# Patient Record
Sex: Female | Born: 1940
Health system: Southern US, Community
[De-identification: ages and names within clinical notes are randomized; demographics above are authoritative.]

## PROBLEM LIST (undated history)

## (undated) DIAGNOSIS — Z9181 History of falling: Secondary | ICD-10-CM

## (undated) DIAGNOSIS — K219 Gastro-esophageal reflux disease without esophagitis: Secondary | ICD-10-CM

## (undated) DIAGNOSIS — Z8711 Personal history of peptic ulcer disease: Secondary | ICD-10-CM

## (undated) DIAGNOSIS — H409 Unspecified glaucoma: Secondary | ICD-10-CM

## (undated) DIAGNOSIS — I639 Cerebral infarction, unspecified: Secondary | ICD-10-CM

## (undated) DIAGNOSIS — N201 Calculus of ureter: Secondary | ICD-10-CM

## (undated) DIAGNOSIS — Z8719 Personal history of other diseases of the digestive system: Secondary | ICD-10-CM

## (undated) DIAGNOSIS — R011 Cardiac murmur, unspecified: Secondary | ICD-10-CM

## (undated) DIAGNOSIS — R3915 Urgency of urination: Secondary | ICD-10-CM

## (undated) DIAGNOSIS — M1990 Unspecified osteoarthritis, unspecified site: Secondary | ICD-10-CM

## (undated) DIAGNOSIS — Z87442 Personal history of urinary calculi: Secondary | ICD-10-CM

## (undated) DIAGNOSIS — E785 Hyperlipidemia, unspecified: Secondary | ICD-10-CM

## (undated) HISTORY — PX: COLONOSCOPY: SHX174

## (undated) HISTORY — PX: BACK SURGERY: SHX140

## (undated) HISTORY — PX: ENDOMETRIAL ABLATION: SHX621

## (undated) HISTORY — PX: UPPER GI ENDOSCOPY: SHX6162

## (undated) HISTORY — PX: EYE SURGERY: SHX253

## (undated) HISTORY — PX: TUBAL LIGATION: SHX77

---

## 1990-08-03 HISTORY — PX: CERVICAL SPINE SURGERY: SHX589

## 1993-08-03 HISTORY — PX: PUBOVAGINAL SLING: SHX1035

## 1996-08-03 HISTORY — PX: LAPAROSCOPIC CHOLECYSTECTOMY: SUR755

## 1999-09-25 ENCOUNTER — Encounter (INDEPENDENT_AMBULATORY_CARE_PROVIDER_SITE_OTHER): Payer: Self-pay | Admitting: Specialist

## 1999-09-25 ENCOUNTER — Ambulatory Visit (HOSPITAL_COMMUNITY): Admission: RE | Admit: 1999-09-25 | Discharge: 1999-09-25 | Payer: Self-pay | Admitting: Gastroenterology

## 2000-10-04 ENCOUNTER — Emergency Department (HOSPITAL_COMMUNITY): Admission: EM | Admit: 2000-10-04 | Discharge: 2000-10-04 | Payer: Self-pay | Admitting: Emergency Medicine

## 2004-01-14 ENCOUNTER — Other Ambulatory Visit: Admission: RE | Admit: 2004-01-14 | Discharge: 2004-01-14 | Payer: Self-pay | Admitting: Internal Medicine

## 2004-11-03 ENCOUNTER — Ambulatory Visit (HOSPITAL_COMMUNITY): Admission: RE | Admit: 2004-11-03 | Discharge: 2004-11-03 | Payer: Self-pay | Admitting: Gastroenterology

## 2005-01-25 LAB — HM COLONOSCOPY: HM Colonoscopy: NORMAL

## 2005-07-09 ENCOUNTER — Other Ambulatory Visit: Payer: Self-pay

## 2005-07-13 ENCOUNTER — Ambulatory Visit: Payer: Self-pay | Admitting: Surgery

## 2006-09-03 ENCOUNTER — Ambulatory Visit: Payer: Self-pay | Admitting: Family Medicine

## 2006-09-03 LAB — CONVERTED CEMR LAB
TSH: 1.868 microintl units/mL
WBC, blood: 10.4 10*3/uL

## 2007-01-28 ENCOUNTER — Ambulatory Visit: Payer: Self-pay | Admitting: Family Medicine

## 2007-03-07 ENCOUNTER — Ambulatory Visit: Payer: Self-pay | Admitting: Family Medicine

## 2007-03-07 ENCOUNTER — Ambulatory Visit: Payer: Self-pay | Admitting: *Deleted

## 2007-03-10 ENCOUNTER — Ambulatory Visit: Payer: Self-pay | Admitting: Family Medicine

## 2007-03-17 ENCOUNTER — Ambulatory Visit: Payer: Self-pay | Admitting: Family Medicine

## 2007-03-29 ENCOUNTER — Ambulatory Visit: Payer: Self-pay | Admitting: Family Medicine

## 2007-04-11 ENCOUNTER — Encounter (INDEPENDENT_AMBULATORY_CARE_PROVIDER_SITE_OTHER): Payer: Self-pay | Admitting: Family Medicine

## 2007-04-11 DIAGNOSIS — K219 Gastro-esophageal reflux disease without esophagitis: Secondary | ICD-10-CM | POA: Insufficient documentation

## 2007-04-11 DIAGNOSIS — E785 Hyperlipidemia, unspecified: Secondary | ICD-10-CM

## 2007-04-11 DIAGNOSIS — Z8659 Personal history of other mental and behavioral disorders: Secondary | ICD-10-CM | POA: Insufficient documentation

## 2007-04-20 ENCOUNTER — Encounter (INDEPENDENT_AMBULATORY_CARE_PROVIDER_SITE_OTHER): Payer: Self-pay | Admitting: *Deleted

## 2007-04-21 ENCOUNTER — Ambulatory Visit: Payer: Self-pay | Admitting: Family Medicine

## 2007-06-22 ENCOUNTER — Ambulatory Visit: Payer: Self-pay | Admitting: Family Medicine

## 2007-06-22 ENCOUNTER — Ambulatory Visit: Payer: Self-pay | Admitting: Internal Medicine

## 2007-09-27 ENCOUNTER — Ambulatory Visit: Payer: Self-pay | Admitting: Family Medicine

## 2007-09-27 ENCOUNTER — Encounter (INDEPENDENT_AMBULATORY_CARE_PROVIDER_SITE_OTHER): Payer: Self-pay | Admitting: Internal Medicine

## 2007-09-27 LAB — CONVERTED CEMR LAB
BUN: 19 mg/dL (ref 6–23)
Calcium: 10.5 mg/dL (ref 8.4–10.5)
Cholesterol: 173 mg/dL (ref 0–200)
Creatinine, Ser: 0.84 mg/dL (ref 0.40–1.20)
Free T4: 1.19 ng/dL (ref 0.89–1.80)
Glucose, Bld: 89 mg/dL (ref 70–99)
Potassium: 4.7 meq/L (ref 3.5–5.3)
TSH: 1.205 microintl units/mL (ref 0.350–5.50)
Triglycerides: 93 mg/dL (ref <150)
VLDL: 19 mg/dL (ref 0–40)

## 2007-11-07 ENCOUNTER — Ambulatory Visit: Payer: Self-pay | Admitting: Family Medicine

## 2007-11-07 ENCOUNTER — Ambulatory Visit (HOSPITAL_COMMUNITY): Admission: RE | Admit: 2007-11-07 | Discharge: 2007-11-07 | Payer: Self-pay | Admitting: Family Medicine

## 2007-11-14 ENCOUNTER — Ambulatory Visit: Payer: Self-pay | Admitting: Family Medicine

## 2007-11-17 ENCOUNTER — Ambulatory Visit (HOSPITAL_COMMUNITY): Admission: RE | Admit: 2007-11-17 | Discharge: 2007-11-17 | Payer: Self-pay | Admitting: Internal Medicine

## 2007-12-20 ENCOUNTER — Encounter: Admission: RE | Admit: 2007-12-20 | Discharge: 2008-01-12 | Payer: Self-pay | Admitting: Family Medicine

## 2008-03-01 ENCOUNTER — Ambulatory Visit (HOSPITAL_COMMUNITY): Admission: RE | Admit: 2008-03-01 | Discharge: 2008-03-01 | Payer: Self-pay | Admitting: Family Medicine

## 2008-03-01 ENCOUNTER — Ambulatory Visit: Payer: Self-pay | Admitting: Family Medicine

## 2008-08-20 ENCOUNTER — Ambulatory Visit: Payer: Self-pay | Admitting: Specialist

## 2008-10-25 ENCOUNTER — Ambulatory Visit: Payer: Self-pay | Admitting: Specialist

## 2010-01-06 ENCOUNTER — Ambulatory Visit: Payer: Self-pay | Admitting: Internal Medicine

## 2010-01-26 LAB — HM DEXA SCAN

## 2010-02-17 ENCOUNTER — Ambulatory Visit: Payer: Self-pay | Admitting: Internal Medicine

## 2010-02-19 ENCOUNTER — Ambulatory Visit: Payer: Self-pay | Admitting: Internal Medicine

## 2010-11-26 LAB — HM MAMMOGRAPHY: HM Mammogram: NORMAL

## 2010-12-19 NOTE — Op Note (Signed)
Tiffany Mcbride, Tiffany Mcbride                 ACCOUNT NO.:  [id number]   MEDICAL RECORD NO.:  [id number]          PATIENT TYPE:  AMB   LOCATION:  ENDO                         FACILITY:  [facility]   PHYSICIAN:  Petra Kuba, M.D.    DATE OF BIRTH:  01/24/1941   DATE OF PROCEDURE:  11/03/2004  DATE OF DISCHARGE:                                 OPERATIVE REPORT   PROCEDURE:  Colonoscopy.   INDICATIONS FOR PROCEDURE:  Family history of colon cancer, due for colonic  screening.  Consent was signed after risks, benefits, methods and options  were thoroughly discussed in the office on multiple occasions.   MEDICATIONS:  Demerol 60 and Versed 6.   DESCRIPTION OF PROCEDURE:  Rectal inspection is pertinent for external  hemorrhoids, small.  Digital exam was negative.  Video pediatric adjustable  colonoscope was inserted, easily passed around the colon to the cecum.  This  did require some abdominal pressure but no position changes.  No  abnormalities were seen on insertion.  The cecum was identified by the  appendiceal orifice and ileocecal valve.  Along the cecal fold a small AVM  was seen.  Scope was slowly withdrawn.  The prep was adequate.  There was  some liquid stool that required washing and suctioning.  There was a rare  left-sided diverticula but no polyps, tumors, masses or other abnormalities.  Once back in the rectum, anorectal pull thorough and retroflexion confirmed  some small hemorrhoids.  Scope was straightened and readvanced a short ways  up the left side of the colon.  Air was suctioned and scope removed.  The  patient tolerated the procedure well.  There was no immediate complication.   ENDOSCOPIC DIAGNOSES:  1.  Internal and external hemorrhoids.  2.  Rare left-sided diverticula.  3.  Small cecal arteriovenous malformation.  4.  Otherwise within normal limits to the cecum.   PLAN:  Happy to see back p.r.n.  Otherwise return care to Lucky Cowboy,  M.D., for the customary  healthcare screening and maintenance to include  yearly rectals and guaiacs.  Otherwise repeat screening in five years.      MEM/MEDQ  D:  11/03/2004  T:  11/03/2004  Job:  [id number]   cc:   Lucky Cowboy, M.D.  [address]  [place], [place] [address]  Fax: [phone]

## 2011-04-03 ENCOUNTER — Ambulatory Visit (INDEPENDENT_AMBULATORY_CARE_PROVIDER_SITE_OTHER): Payer: Medicare Other | Admitting: Internal Medicine

## 2011-04-03 DIAGNOSIS — E538 Deficiency of other specified B group vitamins: Secondary | ICD-10-CM

## 2011-04-03 MED ORDER — CYANOCOBALAMIN 1000 MCG/ML IJ SOLN
1000.0000 ug | Freq: Once | INTRAMUSCULAR | Status: AC
  Administered 2011-04-03: 1000 ug via INTRAMUSCULAR

## 2011-04-25 ENCOUNTER — Encounter: Payer: Self-pay | Admitting: Internal Medicine

## 2011-05-04 ENCOUNTER — Ambulatory Visit (INDEPENDENT_AMBULATORY_CARE_PROVIDER_SITE_OTHER): Payer: Medicare Other | Admitting: Internal Medicine

## 2011-05-04 DIAGNOSIS — E538 Deficiency of other specified B group vitamins: Secondary | ICD-10-CM

## 2011-05-04 MED ORDER — CYANOCOBALAMIN 1000 MCG/ML IJ SOLN
1000.0000 ug | Freq: Once | INTRAMUSCULAR | Status: AC
  Administered 2011-05-04: 1000 ug via INTRAMUSCULAR

## 2011-06-01 ENCOUNTER — Other Ambulatory Visit: Payer: Self-pay | Admitting: Internal Medicine

## 2011-06-01 DIAGNOSIS — K219 Gastro-esophageal reflux disease without esophagitis: Secondary | ICD-10-CM

## 2011-06-04 ENCOUNTER — Ambulatory Visit (INDEPENDENT_AMBULATORY_CARE_PROVIDER_SITE_OTHER): Payer: Medicare Other | Admitting: Internal Medicine

## 2011-06-04 DIAGNOSIS — E538 Deficiency of other specified B group vitamins: Secondary | ICD-10-CM

## 2011-06-04 MED ORDER — CYANOCOBALAMIN 1000 MCG/ML IJ SOLN
1000.0000 ug | Freq: Once | INTRAMUSCULAR | Status: AC
  Administered 2011-06-04: 1000 ug via INTRAMUSCULAR

## 2011-06-22 ENCOUNTER — Other Ambulatory Visit: Payer: Self-pay | Admitting: Internal Medicine

## 2011-06-24 ENCOUNTER — Ambulatory Visit: Payer: Medicare Other | Admitting: Internal Medicine

## 2011-07-03 ENCOUNTER — Other Ambulatory Visit: Payer: Self-pay | Admitting: Internal Medicine

## 2011-07-06 ENCOUNTER — Ambulatory Visit: Payer: Medicare Other

## 2011-08-05 ENCOUNTER — Encounter: Payer: Medicare Other | Admitting: Internal Medicine

## 2011-09-08 ENCOUNTER — Ambulatory Visit: Payer: Self-pay

## 2011-09-29 LAB — HM MAMMOGRAPHY: HM Mammogram: NORMAL

## 2011-11-18 ENCOUNTER — Telehealth: Payer: Self-pay | Admitting: Internal Medicine

## 2011-11-18 NOTE — Telephone Encounter (Signed)
Patient notified she can come in for the B-12 injection, she is coming on Friday.

## 2011-11-18 NOTE — Telephone Encounter (Signed)
Patient wanting a B-12 shot.

## 2011-11-20 ENCOUNTER — Ambulatory Visit (INDEPENDENT_AMBULATORY_CARE_PROVIDER_SITE_OTHER): Payer: Medicare Other | Admitting: Internal Medicine

## 2011-11-20 DIAGNOSIS — E538 Deficiency of other specified B group vitamins: Secondary | ICD-10-CM

## 2011-11-20 MED ORDER — CYANOCOBALAMIN 1000 MCG/ML IJ SOLN
1000.0000 ug | Freq: Once | INTRAMUSCULAR | Status: AC
  Administered 2011-11-20: 1000 ug via INTRAMUSCULAR

## 2011-12-14 ENCOUNTER — Other Ambulatory Visit: Payer: Self-pay | Admitting: Internal Medicine

## 2011-12-14 NOTE — Telephone Encounter (Signed)
Patient has not been seen here yet. 

## 2011-12-22 ENCOUNTER — Ambulatory Visit (INDEPENDENT_AMBULATORY_CARE_PROVIDER_SITE_OTHER): Payer: Medicare Other | Admitting: Internal Medicine

## 2011-12-22 DIAGNOSIS — E538 Deficiency of other specified B group vitamins: Secondary | ICD-10-CM

## 2011-12-22 MED ORDER — CYANOCOBALAMIN 1000 MCG/ML IJ SOLN
1000.0000 ug | Freq: Once | INTRAMUSCULAR | Status: AC
  Administered 2011-12-22: 1000 ug via INTRAMUSCULAR

## 2012-01-26 ENCOUNTER — Encounter: Payer: Self-pay | Admitting: Internal Medicine

## 2012-01-26 ENCOUNTER — Ambulatory Visit (INDEPENDENT_AMBULATORY_CARE_PROVIDER_SITE_OTHER): Payer: Medicare Other | Admitting: Internal Medicine

## 2012-01-26 VITALS — BP 122/64 | HR 79 | Temp 98.4°F | Resp 18 | Ht 61.0 in | Wt 182.0 lb

## 2012-01-26 DIAGNOSIS — Z0000 Encounter for general adult medical examination without abnormal findings: Secondary | ICD-10-CM

## 2012-01-26 DIAGNOSIS — Z124 Encounter for screening for malignant neoplasm of cervix: Secondary | ICD-10-CM

## 2012-01-26 DIAGNOSIS — M6281 Muscle weakness (generalized): Secondary | ICD-10-CM

## 2012-01-26 DIAGNOSIS — R0609 Other forms of dyspnea: Secondary | ICD-10-CM

## 2012-01-26 DIAGNOSIS — M1990 Unspecified osteoarthritis, unspecified site: Secondary | ICD-10-CM | POA: Insufficient documentation

## 2012-01-26 DIAGNOSIS — R300 Dysuria: Secondary | ICD-10-CM

## 2012-01-26 DIAGNOSIS — R5381 Other malaise: Secondary | ICD-10-CM

## 2012-01-26 DIAGNOSIS — Z1211 Encounter for screening for malignant neoplasm of colon: Secondary | ICD-10-CM

## 2012-01-26 DIAGNOSIS — H409 Unspecified glaucoma: Secondary | ICD-10-CM | POA: Insufficient documentation

## 2012-01-26 DIAGNOSIS — R0600 Dyspnea, unspecified: Secondary | ICD-10-CM

## 2012-01-26 DIAGNOSIS — E785 Hyperlipidemia, unspecified: Secondary | ICD-10-CM

## 2012-01-26 DIAGNOSIS — R0989 Other specified symptoms and signs involving the circulatory and respiratory systems: Secondary | ICD-10-CM

## 2012-01-26 DIAGNOSIS — N952 Postmenopausal atrophic vaginitis: Secondary | ICD-10-CM

## 2012-01-26 DIAGNOSIS — R5383 Other fatigue: Secondary | ICD-10-CM

## 2012-01-26 DIAGNOSIS — R309 Painful micturition, unspecified: Secondary | ICD-10-CM

## 2012-01-26 LAB — POCT URINALYSIS DIPSTICK
Bilirubin, UA: NEGATIVE
Blood, UA: NEGATIVE
Leukocytes, UA: NEGATIVE
Nitrite, UA: NEGATIVE
pH, UA: 5.5

## 2012-01-26 MED ORDER — ESTRADIOL 0.1 MG/GM VA CREA: TOPICAL_CREAM | VAGINAL | Status: DC

## 2012-01-26 NOTE — Assessment & Plan Note (Signed)
Last PAP was July 2011, and was normal.  Repeat done today

## 2012-01-26 NOTE — Progress Notes (Signed)
Patient ID: Tiffany Mcbride, female   DOB: Jan 01, 1941, 71 y.o.   MRN: [id number] The patient is here for annual Medicare wellness examination and management of other chronic and acute problems. Last seen in April 2012 and arrives with multiple complaints. 1) vaginal burning and urinary frequency for 2 weeks.  2)  Right sided hip and lateral chest wall pain for 9 months which started after a fall. She and boyfriend fell while dancing side by side , in a twisting motion.  She did not hit the ground . She fell on top of him.  He broke a hip,  But she did not get evaluated.  She denies any  trouble walking, but has achiness with housework and changes in position. 3) persistent malaise,  weight gain, present since last visit. Denies depressive symptoms and loneliness  Happy living alone "I have a little house, I don't answer to nobody but God",  But has no initiative to exercise or walk.  Legs feel weak,  Has not danced or walked her usual walk since September.  This morning while taking care of her great grandchildren this morning she suddenly felt sick and nauseated.  For there [last several days she has had a low grade headache and light headedness. Prior workup for same symptoms last year included a sleep study test which was normal , done at [facility]  She denies  chest pain.  She has a history of only mild hyperlipidemia (LDL 141 4/12) so no prior cardiology evaluation.  Up too date on screening tests (PAP 2011, mammogram 2012,  colonoscopy 2006)    The risk factors are reflected in the social history.  The roster of all physicians providing medical care to patient - is listed in the Snapshot section of the chart.  Activities of daily living:  The patient is 100% independent in all ADLs: dressing, toileting, feeding as well as independent mobility  Home safety : The patient has smoke detectors in the home. They wear seatbelts.  There are no firearms at home. There is no violence in the home.   There  is no risks for hepatitis, STDs or HIV. There is no   history of blood transfusion. They have no travel history to infectious disease endemic areas of the world.  The patient has seen their dentist in the last six month. They have seen their eye doctor in the last year. They admit to slight hearing difficulty with regard to whispered voices and some television programs.  They have deferred audiologic testing in the last year.  They do not  have excessive sun exposure. Discussed the need for sun protection: hats, long sleeves and use of sunscreen if there is significant sun exposure.   Diet: the importance of a healthy diet is discussed. They do have a healthy diet.  The benefits of regular aerobic exercise were discussed. She walks 4 times per week ,  20 minutes.   Depression screen: there are no signs or vegative symptoms of depression- irritability, change in appetite, anhedonia, sadness/tearfullness.  Cognitive assessment: the patient manages all their financial and personal affairs and is actively engaged. They could relate day,date,year and events; recalled 2/3 objects at 3 minutes; performed clock-face test normally.  The following portions of the patient's history were reviewed and updated as appropriate: allergies, current medications, past family history, past medical history,  past surgical history, past social history  and problem list.  Visual acuity was not assessed per patient preference since she has regular  follow up with her ophthalmologist. Hearing and body mass index were assessed and reviewed.   During the course of the visit the patient was educated and counseled about appropriate screening and preventive services including : fall prevention , diabetes screening, nutrition counseling, colorectal cancer screening, and recommended immunizations.    BP 122/64  Pulse 79  Temp 98.4 F (36.9 C) (Oral)  Resp 18  Ht 5\' 1"  (1.549 m)  Wt 182 lb (82.555 kg)  BMI 34.39 kg/m2  SpO2  97%  General Appearance:    Alert, cooperative, no distress, appears stated age  Head:    Normocephalic, without obvious abnormality, atraumatic  Eyes:    PERRL, conjunctiva/corneas clear, EOM's intact, fundi    benign, both eyes  Ears:    Normal TM's and external ear canals, both ears  Nose:   Nares normal, septum midline, mucosa normal, no drainage    or sinus tenderness  Throat:   Lips, mucosa, and tongue normal; teeth and gums normal  Neck:   Supple, symmetrical, trachea midline, no adenopathy;    thyroid:  no enlargement/tenderness/nodules; no carotid   bruit or JVD  Back:     Symmetric, no curvature, ROM normal, no CVA tenderness  Lungs:     Clear to auscultation bilaterally, respirations unlabored  Chest Wall:    No tenderness or deformity   Heart:    Regular rate and rhythm, S1 and S2 normal, no murmur, rub   or gallop  Breast Exam:    No tenderness, masses, or nipple abnormality  Abdomen:     Soft, non-tender, bowel sounds active all four quadrants,    no masses, no organomegaly  Genitalia:    Normal female without lesion, discharge or tenderness  Rectal:    Normal tone, normal prostate, no masses or tenderness;   guaiac negative stool  Extremities:   Extremities normal, atraumatic, no cyanosis or edema  Pulses:   2+ and symmetric all extremities  Skin:   Skin color, texture, turgor normal, no rashes or lesions  Lymph nodes:   Cervical, supraclavicular, and axillary nodes normal  Neurologic:   CNII-XII intact, normal strength, sensation and reflexes    throughout   Postmenopausal atrophic vaginitis Based on pelvic exam today. With history of burning and normal UA.  Trial of estrace cream.   Screening for cervical cancer Last PAP was July 2011, and was normal.  Repeat done today  Screening for colon cancer Normal colonsocopy in 2006,  Normal FOBT and rectal exam today   Malaise Persistent for over one year.  Prior workup negative.  EKG normal,.  UA normal.  Repeat  serologies for thyroid, anemia,  CKD,  And if normal refer to cardiology for stress test given FH.    Updated Medication List Outpatient Encounter Prescriptions as of 01/26/2012  Medication Sig Dispense Refill  . CELEBREX 200 MG capsule TAKE ONE CAPSULE BY MOUTH EVERY DAY  30 each  6  . METROGEL 1 % gel APPLY A THIN LAYER TO AFFECTED AREA ONCE A DAY  55 g  0  . NEXIUM 40 MG capsule TAKE ONE CAPSULE BY MOUTH ONCE A DAY BEFORE BREAKFAST  30 capsule  2  . estradiol (ESTRACE) 0.1 MG/GM vaginal cream 1 gram intravaginally daily for 2 weeks, then twcie weekly thereafter  42.5 g  1

## 2012-01-26 NOTE — Patient Instructions (Addendum)
Your urine test showed no signs of infection,  But did suggest mild dehydration.   Your EKG was normal   Please increase your water intake and return in the morning for fasting bloodwork..  If it is all normal,  I will refer you to [facility] here in [facility] for a stress test on your heart

## 2012-01-26 NOTE — Assessment & Plan Note (Signed)
Persistent for over one year.  Prior workup negative.  EKG normal,.  UA normal.  Repeat serologies for thyroid, anemia,  CKD,  And if normal refer to cardiology for stress test given FH.

## 2012-01-26 NOTE — Assessment & Plan Note (Signed)
Normal colonsocopy in 2006,  Normal FOBT and rectal exam today

## 2012-01-26 NOTE — Assessment & Plan Note (Signed)
Based on pelvic exam today. With history of burning and normal UA.  Trial of estrace cream.

## 2012-01-27 ENCOUNTER — Other Ambulatory Visit (HOSPITAL_COMMUNITY)
Admission: RE | Admit: 2012-01-27 | Discharge: 2012-01-27 | Disposition: A | Discharge status: Home / Self Care | Payer: Medicare Other | Source: Ambulatory Visit | Attending: Internal Medicine | Admitting: Internal Medicine

## 2012-01-27 DIAGNOSIS — Z01419 Encounter for gynecological examination (general) (routine) without abnormal findings: Secondary | ICD-10-CM | POA: Insufficient documentation

## 2012-01-27 DIAGNOSIS — Z1159 Encounter for screening for other viral diseases: Secondary | ICD-10-CM | POA: Insufficient documentation

## 2012-01-27 NOTE — Addendum Note (Signed)
Addended by: Darletta Moll A on: 01/27/2012 08:38 AM   Modules accepted: Orders

## 2012-02-01 ENCOUNTER — Other Ambulatory Visit (INDEPENDENT_AMBULATORY_CARE_PROVIDER_SITE_OTHER): Payer: Medicare Other | Admitting: *Deleted

## 2012-02-01 DIAGNOSIS — R5383 Other fatigue: Secondary | ICD-10-CM

## 2012-02-01 DIAGNOSIS — M6281 Muscle weakness (generalized): Secondary | ICD-10-CM

## 2012-02-01 DIAGNOSIS — E785 Hyperlipidemia, unspecified: Secondary | ICD-10-CM

## 2012-02-01 LAB — CBC WITH DIFFERENTIAL/PLATELET
Basophils Absolute: 0 10*3/uL (ref 0.0–0.1)
Eosinophils Absolute: 0.1 10*3/uL (ref 0.0–0.7)
Lymphocytes Relative: 22.8 % (ref 12.0–46.0)
MCHC: 33 g/dL (ref 30.0–36.0)
Neutro Abs: 8.4 10*3/uL — ABNORMAL HIGH (ref 1.4–7.7)
Neutrophils Relative %: 70.7 % (ref 43.0–77.0)
Platelets: 280 10*3/uL (ref 150.0–400.0)
RDW: 13.1 % (ref 11.5–14.6)

## 2012-02-01 LAB — COMPREHENSIVE METABOLIC PANEL
ALT: 22 U/L (ref 0–35)
Albumin: 4.2 g/dL (ref 3.5–5.2)
CO2: 26 mEq/L (ref 19–32)
Calcium: 9.8 mg/dL (ref 8.4–10.5)
Chloride: 105 mEq/L (ref 96–112)
GFR: 64.42 mL/min (ref >60.00)
Glucose, Bld: 106 mg/dL — ABNORMAL HIGH (ref 70–99)
Sodium: 140 mEq/L (ref 135–145)
Total Protein: 7.8 g/dL (ref 6.0–8.3)

## 2012-02-01 LAB — LIPID PANEL: Total CHOL/HDL Ratio: 4

## 2012-02-01 LAB — MAGNESIUM: Magnesium: 2 mg/dL (ref 1.5–2.5)

## 2012-02-02 LAB — POCT URINALYSIS DIPSTICK
Blood, UA: NEGATIVE
Glucose, UA: NEGATIVE
Urobilinogen, UA: 0.2
pH, UA: 5.5

## 2012-02-02 LAB — CK TOTAL AND CKMB (NOT AT ARMC)
CK, MB: 2.1 ng/mL (ref 0.3–4.0)
Total CK: 73 U/L (ref 7–177)

## 2012-02-02 NOTE — Addendum Note (Signed)
Addended by: Jobie Quaker on: 02/02/2012 04:42 PM   Modules accepted: Orders

## 2012-02-08 ENCOUNTER — Encounter: Payer: Self-pay | Admitting: Internal Medicine

## 2012-02-08 ENCOUNTER — Ambulatory Visit (INDEPENDENT_AMBULATORY_CARE_PROVIDER_SITE_OTHER): Payer: Medicare Other | Admitting: Internal Medicine

## 2012-02-08 ENCOUNTER — Telehealth: Payer: Self-pay | Admitting: Internal Medicine

## 2012-02-08 VITALS — BP 126/68 | HR 69 | Temp 98.2°F | Resp 16 | Wt 183.8 lb

## 2012-02-08 DIAGNOSIS — R5381 Other malaise: Secondary | ICD-10-CM

## 2012-02-08 DIAGNOSIS — R42 Dizziness and giddiness: Secondary | ICD-10-CM

## 2012-02-08 DIAGNOSIS — D72829 Elevated white blood cell count, unspecified: Secondary | ICD-10-CM

## 2012-02-08 DIAGNOSIS — E538 Deficiency of other specified B group vitamins: Secondary | ICD-10-CM

## 2012-02-08 DIAGNOSIS — N390 Urinary tract infection, site not specified: Secondary | ICD-10-CM

## 2012-02-08 DIAGNOSIS — N952 Postmenopausal atrophic vaginitis: Secondary | ICD-10-CM

## 2012-02-08 DIAGNOSIS — R5383 Other fatigue: Secondary | ICD-10-CM

## 2012-02-08 LAB — POCT URINALYSIS DIPSTICK
Ketones, UA: NEGATIVE
Protein, UA: NEGATIVE

## 2012-02-08 LAB — HEMOCCULT GUIAC POC 1CARD (OFFICE)

## 2012-02-08 MED ORDER — CYANOCOBALAMIN 1000 MCG/ML IJ SOLN
1000.0000 ug | Freq: Once | INTRAMUSCULAR | Status: AC
  Administered 2012-02-08: 1000 ug via INTRAMUSCULAR

## 2012-02-08 MED ORDER — ESTRADIOL 0.1 MG/GM VA CREA: TOPICAL_CREAM | VAGINAL | Status: DC

## 2012-02-08 NOTE — Patient Instructions (Addendum)
Please take a daily allergy tablet (allegra, zyrtec, claritin  Are nonsedating) to see if your cough clears up.    Increase your vaginal cream to 2 grams daily for one week,  Then reduce to twice weekly   We gave you a b12 shot today  If your labs are nomral we will order ultrasounds of your carotid arteries

## 2012-02-08 NOTE — Progress Notes (Signed)
Patient ID: Tiffany Mcbride, female   DOB: Sep 09, 1940, 71 y.o.   MRN: [id number] Patient Active Problem List  Diagnosis  . DYSLIPIDEMIA  . GERD  . DEPRESSION, HX OF  . Postmenopausal atrophic vaginitis  . Glaucoma  . Osteoarthritis  . Screening for cervical cancer  . Screening for colon cancer  . Malaise    Subjective:  CC:   Chief Complaint  Patient presents with  . Urinary Tract Infection    HPI:   Tiffany Kalata Smithis a 71 y.o. female who presents with persistent malaise:  "I feel so bad you have no idea."  Has had recurrent headaches and dizzy spells for two weeks.  No exposure to ticks or mosquitoes.  No recent travel.  Not physically active.  No recent GI illness.  Continues to have  vaginal burning and urinary frequency  Every hour during the night with  Urinary incontinence despite normal UA and vaginal exam notable only for atrophy. Started estrace cream 1 gm nightly with no significant change thus far. (one week ago)     Past Medical History  Diagnosis Date  . Glaucoma     early, Dr Tiffany Mcbride  . Osteoarthritis     Past Surgical History  Procedure Date  . Pubovaginal sling 1995  . Cholecystectomy     laparascopic  . Endometrial ablation   . Spine surgery     cerrvical,  Tiffany Mcbride       . cyanocobalamin  1,000 mcg Intramuscular Once     The following portions of the patient's history were reviewed and updated as appropriate: Allergies, current medications, and problem list.    Review of Systems:   12 Pt  review of systems was negative except those addressed in the HPI,     History   Social History  . Marital Status: Divorced    Spouse Name: N/A    Number of Children: N/A  . Years of Education: N/A   Occupational History  . Not on file.   Social History Main Topics  . Smoking status: Never Smoker   . Smokeless tobacco: Never Used  . Alcohol Use: No  . Drug Use: No  . Sexually Active: Not on file   Other Topics Concern  . Not on file    Social History Narrative  . No narrative on file    Objective:  BP 126/68  Pulse 69  Temp 98.2 F (36.8 C) (Oral)  Resp 16  Wt 183 lb 12 oz (83.348 kg)  SpO2 96%  General appearance: alert, cooperative and appears stated age Ears: normal TM's and external ear canals both ears Throat: lips, mucosa, and tongue normal; teeth and gums normal Neck: no adenopathy, no carotid bruit, supple, symmetrical, trachea midline and thyroid not enlarged, symmetric, no tenderness/mass/nodules Back: symmetric, no curvature. ROM normal. No CVA tenderness. Lungs: clear to auscultation bilaterally Heart: regular rate and rhythm, S1, S2 normal, no murmur, click, rub or gallop Abdomen: soft, non-tender; bowel sounds normal; no masses,  no organomegaly Pulses: 2+ and symmetric Skin: Skin color, texture, turgor normal. No rashes or lesions Lymph nodes: Cervical, supraclavicular, and axillary nodes normal.  Assessment and Plan:  Malaise Etiology unclear.  Screening labs at her annual exam last week were notable only for slight leukocytosis.  Nor orthostatic today by my exam.  Will screen for b12 deficiency,  Repeat. CBC,  And check serologies for IM, Lyme and RMSF.  If negative will need stress test ,  carotid dopplers and MRI.  Postmenopausal atrophic vaginitis UA repeated and normal.  Will try Increasing dose of cream to 2 grams daily for one week.     Updated Medication List Outpatient Encounter Prescriptions as of 02/08/2012  Medication Sig Dispense Refill  . CELEBREX 200 MG capsule TAKE ONE CAPSULE BY MOUTH EVERY DAY  30 each  6  . estradiol (ESTRACE) 0.1 MG/GM vaginal cream 2 gram intravaginally daily for 1 weeks, then twice weekly thereafter  42.5 g  1  . METROGEL 1 % gel APPLY A THIN LAYER TO AFFECTED AREA ONCE A DAY  55 g  0  . NEXIUM 40 MG capsule TAKE ONE CAPSULE BY MOUTH ONCE A DAY BEFORE BREAKFAST  30 capsule  2  . DISCONTD: estradiol (ESTRACE) 0.1 MG/GM vaginal cream 1 gram  intravaginally daily for 2 weeks, then twcie weekly thereafter  42.5 g  1   Facility-Administered Encounter Medications as of 02/08/2012  Medication Dose Route Frequency Provider Last Rate Last Dose  . cyanocobalamin ((VITAMIN B-12)) injection 1,000 mcg  1,000 mcg Intramuscular Once Tiffany Shams, MD   1,000 mcg at 02/08/12 1717     Orders Placed This Encounter  Procedures  . Vitamin B12  . CBC with Differential  . Sedimentation rate  . Lyme Ab, Total/IgM Responses [[facility]]  . [place] spotted fvr ab, IgM-blood  . Monospot  . POCT urinalysis dipstick    No Follow-up on file.

## 2012-02-08 NOTE — Telephone Encounter (Signed)
°  Caller: Tiffany Mcbride/Patient; PCP: Duncan Dull; CB#: [phone];  Call regarding Urinary Pain/Bleeding; She had  UTI SX and  02/01/12 and brought U/A specimen in.  She is still having burning and frequency and she never received an anbx.  U/A results from 02/02/12 normal per EPIC chart  and pt has been using Estrace cream, but still having SX.  Pt requesting a Mono Test.   Traiged U/A SX and last voided at 0900.  Disp = needs to be seen in 24 hrs.  She just "feels bad" and states "something is wrong".   Appt made with [place] 02/08/12 at 1530.  Call back inst given

## 2012-02-09 LAB — CBC WITH DIFFERENTIAL/PLATELET
Eosinophils Relative: 1 % (ref 0.0–5.0)
HCT: 43 % (ref 36.0–46.0)
Hemoglobin: 14.3 g/dL (ref 12.0–15.0)
Lymphs Abs: 2.8 10*3/uL (ref 0.7–4.0)
Monocytes Relative: 5.3 % (ref 3.0–12.0)
Neutro Abs: 6.9 10*3/uL (ref 1.4–7.7)
RDW: 12.9 % (ref 11.5–14.6)
WBC: 10.4 10*3/uL (ref 4.5–10.5)

## 2012-02-09 LAB — MONONUCLEOSIS SCREEN: Mono Screen: NEGATIVE

## 2012-02-09 LAB — VITAMIN B12: Vitamin B-12: >1500 pg/mL — ABNORMAL HIGH (ref 211–911)

## 2012-02-09 LAB — ROCKY MTN SPOTTED FVR AB, IGM-BLOOD: ROCKY MTN SPOTTED FEVER, IGM: 0.22 IV

## 2012-02-09 NOTE — Assessment & Plan Note (Signed)
Etiology unclear.  Screening labs at her annual exam last week were notable only for slight leukocytosis.  Nor orthostatic today by my exam.  Will screen for b12 deficiency,  Repeat. CBC,  And check serologies for IM, Lyme and RMSF.  If negative will need stress test ,  carotid dopplers and MRI.

## 2012-02-09 NOTE — Assessment & Plan Note (Signed)
Increasing dose of cream to 2 grams daily for one week.

## 2012-02-10 ENCOUNTER — Encounter: Payer: Self-pay | Admitting: Internal Medicine

## 2012-02-11 LAB — LYME AB, TOTAL/IGM RESPONSES: Lyme Disease Ab, Quant, IgM: <0.91 index (ref 0.00–0.90)

## 2012-02-17 ENCOUNTER — Telehealth: Payer: Self-pay | Admitting: Internal Medicine

## 2012-02-17 DIAGNOSIS — M25559 Pain in unspecified hip: Secondary | ICD-10-CM

## 2012-02-17 NOTE — Addendum Note (Signed)
Addended by: Duncan Dull on: 02/17/2012 06:47 AM   Modules accepted: Orders

## 2012-02-17 NOTE — Telephone Encounter (Signed)
Patient having pain in her side ,hip and stomach wanted to have those areas view by a MRI , CT or X-ray to see what is causing the pain before she has the MRI of her head  . She also stated she can hardly walk.

## 2012-02-17 NOTE — Telephone Encounter (Signed)
Left detailed message notifying patient that she will receive call from our office to get her scheduled for the MRI of lumbar spine

## 2012-02-17 NOTE — Telephone Encounter (Signed)
i have cancelled the MRI of the brain.  We will start with MRI of lumbar spine.

## 2012-02-19 ENCOUNTER — Ambulatory Visit: Payer: Self-pay | Admitting: Internal Medicine

## 2012-02-23 ENCOUNTER — Telehealth: Payer: Self-pay | Admitting: Internal Medicine

## 2012-02-23 DIAGNOSIS — M549 Dorsalgia, unspecified: Secondary | ICD-10-CM

## 2012-02-23 DIAGNOSIS — M25559 Pain in unspecified hip: Secondary | ICD-10-CM

## 2012-02-23 NOTE — Telephone Encounter (Signed)
Her MRI showed mild degenerative changes at multiple levels with no evidence of spinal stenosis. It does mean she's not having low back pain just that it is not a surgical issue. I recommend referral to physical therapy.

## 2012-02-24 NOTE — Telephone Encounter (Signed)
Patient notified, she wants to proceed with physical therapy.

## 2012-02-26 ENCOUNTER — Ambulatory Visit: Payer: Medicare Other | Admitting: Cardiovascular Disease

## 2012-02-27 ENCOUNTER — Other Ambulatory Visit: Payer: Self-pay | Admitting: Internal Medicine

## 2012-03-01 ENCOUNTER — Ambulatory Visit: Payer: Medicare Other | Admitting: Cardiovascular Disease

## 2012-03-02 ENCOUNTER — Ambulatory Visit (INDEPENDENT_AMBULATORY_CARE_PROVIDER_SITE_OTHER): Payer: Medicare Other | Admitting: Internal Medicine

## 2012-03-02 ENCOUNTER — Encounter: Payer: Self-pay | Admitting: Internal Medicine

## 2012-03-02 VITALS — BP 120/64 | HR 64 | Temp 98.3°F | Resp 16 | Wt 184.2 lb

## 2012-03-02 DIAGNOSIS — R5381 Other malaise: Secondary | ICD-10-CM

## 2012-03-02 DIAGNOSIS — G8929 Other chronic pain: Secondary | ICD-10-CM

## 2012-03-02 DIAGNOSIS — R5383 Other fatigue: Secondary | ICD-10-CM

## 2012-03-02 DIAGNOSIS — E669 Obesity, unspecified: Secondary | ICD-10-CM

## 2012-03-02 DIAGNOSIS — M25559 Pain in unspecified hip: Secondary | ICD-10-CM

## 2012-03-02 NOTE — Patient Instructions (Addendum)
Consider a Low Glycemic Index Diet and eating 6 smaller meals daily .  This frequent feeding stimulates your metabolism and the lower glycemic index foods will lower your blood sugars:   This is an example of my daily  "Low GI"  Diet:  All of the foods can be found at grocery stores and in bulk at [employer]  club   7 AM Breakfast:  Low carbohydrate Protein  Shakes (I recommend the EAS AdvantEdge "Carb Control" shakes  Or the low carb shakes by Atkins.   Both are available everywhere:  In  cases at [employer]  Or in 4 packs at grocery stores and pharmacies  2.5 carbs  (Alternative is  a toasted Arnold's Sandwhich Thin w/ peanut butter, a "Bagel Thin" with cream cheese and salmon) or  a scrambled egg burrito made with a low carb tortilla .  Avoid cereal and bananas, oatmeal too unless the old fashioned kind that takes 30-40 minutes to prepare.  the rest is overly processed, has minimal fiber, and loaded with carbohydrates!   10 AM: Protein bar by Atkins (the snack size, under 200 cal.  There are many varieties , available widely again or in bulk in limited varieties at [employer])  Other so called "protein bars" tend to be loaded with carbohydrates.  Remember, in food advertising, the word "energy" is synonymous for " carbohydrate."  Lunch: sandwich of turkey, (or any lunchmeat or canned tuna), fresh avocado and cheese on a lower carbohydrate pita bread, flatbread, or tortilla . Ok to use mayonnaise. The bread is the only source or carbohydrate that can be decreased (Joseph's makes a pita bread and a flat bread  Are 50 cal and 4 net carbs ; Toufayan makes a low carb flatbread 100 cal and 9 net carbs  and  Mission makes a low carb whole wheat tortilla  210 cal and 6 net carbs)  3 PM:  Mid day :  Another protein bar,  Or a  cheese stick (100 cal, 0 carbs),  Or 1 ounce of  almonds, walnuts, pistachios, pecans, peanuts,  Macadamia nuts. Or a Dannon light n Fit greek yogurt, 80 cal 8 net carbs . Avoid "granola"; the dried  cranberries and raisins are loaded with carbohydrates.    6 PM  Dinner:  "mean and green:"  Meat/chicken/fish or a high protein legume; , with a green salad, and a low GI  Veggie (broccoli, cauliflower, green beans, spinach, brussel sprouts. Lima beans) : Avoid "Low fat dressings, [place] and [place]! They are loaded with sugar! Instead use ranch, vinagrette,  Blue cheese, etc  9 PM snack : Breyer's "low carb" fudgsicle or  ice cream bar (Carb Smart line), or  Weight Watcher's ice cream bar , or anouther "no sugar added" ice cream; or another protein shake or a serving of fresh fruit with whipped cream (Avoid bananas, pineapple, grapes  and watermelon on a regular basis because they are high in sugar)   Remember that snack Substitutions should be less than 15 to 20 carbs  Per serving. Remember to subtract fiber grams to get the "net carbs." 

## 2012-03-06 ENCOUNTER — Encounter: Payer: Self-pay | Admitting: Internal Medicine

## 2012-03-06 DIAGNOSIS — E669 Obesity, unspecified: Secondary | ICD-10-CM | POA: Insufficient documentation

## 2012-03-06 DIAGNOSIS — M25559 Pain in unspecified hip: Secondary | ICD-10-CM | POA: Insufficient documentation

## 2012-03-06 DIAGNOSIS — G8929 Other chronic pain: Secondary | ICD-10-CM | POA: Insufficient documentation

## 2012-03-06 NOTE — Progress Notes (Signed)
Patient ID: Tiffany Mcbride, female   DOB: [birth year], 71 y.o.   MRN: [id number]  Patient Active Problem List  Diagnosis  . DYSLIPIDEMIA  . GERD  . DEPRESSION, HX OF  . Postmenopausal atrophic vaginitis  . Glaucoma  . Osteoarthritis  . Screening for cervical cancer  . Screening for colon cancer  . Malaise  . Hip pain, chronic  . Obesity (BMI 30-39.9)    Subjective:  CC:   Chief Complaint  Patient presents with  . Follow-up    HPI:   Tiffany Grandinetti Smithis a 71 y.o. female who presents for follow up on vaginal atrophy, chronic back pain, and persistent malaise. Her MRI showed mild degenerative changes at multiple levels with no evidence of spinal stenosis. I recommend referral to physical therapy.    Past Medical History  Diagnosis Date  . Glaucoma     early, Dr Fransico Michael  . Osteoarthritis     Past Surgical History  Procedure Date  . Pubovaginal sling 1995  . Cholecystectomy     laparascopic  . Endometrial ablation   . Spine surgery     cerrvical,  [place]   The following portions of the patient's history were reviewed and updated as appropriate: Allergies, current medications, and problem list.    Review of Systems:   12 Pt  review of systems was negative except those addressed in the HPI,     History   Social History  . Marital Status: Divorced    Spouse Name: N/A    Number of Children: N/A  . Years of Education: N/A   Occupational History  . Not on file.   Social History Main Topics  . Smoking status: Never Smoker   . Smokeless tobacco: Never Used  . Alcohol Use: No  . Drug Use: No  . Sexually Active: Not on file   Other Topics Concern  . Not on file   Social History Narrative  . No narrative on file    Objective:  BP 120/64  Pulse 64  Temp 98.3 F (36.8 C) (Oral)  Resp 16  Wt 184 lb 4 oz (83.575 kg)  SpO2 97%  General appearance: alert, cooperative and appears stated age Neck: no adenopathy, no carotid bruit, supple, symmetrical,  trachea midline and thyroid not enlarged, symmetric, no tenderness/mass/nodules Back: symmetric, no curvature. ROM normal. No CVA tenderness. Lungs: clear to auscultation bilaterally Heart: regular rate and rhythm, S1, S2 normal, no murmur, click, rub or gallop Abdomen: soft, non-tender; bowel sounds normal; no masses,  no organomegaly Pulses: 2+ and symmetric Skin: Skin color, texture, turgor normal. No rashes or lesions Lymph nodes: Cervical, supraclavicular, and axillary nodes normal.  Assessment and Plan:  Hip pain, chronic Accompanied by low back pain. Recent MRI of the back was done due to normal hip and lumbar spine films. There were degenerative changes but no severe spinal stenosis. She has opted for physical therapy evaluation and treatment.  Malaise  Secondary to chronic pain plus or minus mild dysthymia. We discussed treating her back pain and hip pain and considering of SSRI therapy for dysthymia. She's had normal blood work and a prior sleep study which was normal.  Obesity (BMI 30-39.9) I have addressed  BMI and recommended a low glycemic index diet utilizing smaller more frequent meals to increase metabolism.  I have also recommended that patient start exercising with a goal of 30 minutes of aerobic exercise a minimum of 5 days per week. Screening for lipid disorders, thyroid and diabetes  has already been done    Updated Medication List Outpatient Encounter Prescriptions as of 03/02/2012  Medication Sig Dispense Refill  . CELEBREX 200 MG capsule TAKE ONE CAPSULE BY MOUTH EVERY DAY  30 each  6  . estradiol (ESTRACE) 0.1 MG/GM vaginal cream 2 gram intravaginally daily for 1 weeks, then twice weekly thereafter  42.5 g  1  . METROGEL 1 % gel APPLY A THIN LAYER TO AFFECTED AREA ONCE A DAY  55 g  0  . NEXIUM 40 MG capsule TAKE ONE CAPSULE BY MOUTH ONCE A DAY BEFORE BREAKFAST  30 capsule  2     No orders of the defined types were placed in this encounter.    No Follow-up on  file.

## 2012-03-06 NOTE — Assessment & Plan Note (Signed)
Accompanied by low back pain. Recent MRI of the back was done due to normal hip and lumbar spine films. There were degenerative changes but no severe spinal stenosis. She has opted for physical therapy evaluation and treatment.

## 2012-03-06 NOTE — Assessment & Plan Note (Signed)
Secondary to chronic pain plus or minus mild dysthymia. We discussed treating her back pain and hip pain and considering of SSRI therapy for dysthymia. She's had normal blood work and a prior sleep study which was normal.

## 2012-03-06 NOTE — Assessment & Plan Note (Signed)
I have addressed  BMI and recommended a low glycemic index diet utilizing smaller more frequent meals to increase metabolism.  I have also recommended that patient start exercising with a goal of 30 minutes of aerobic exercise a minimum of 5 days per week. Screening for lipid disorders, thyroid and diabetes has already been done

## 2012-03-09 ENCOUNTER — Ambulatory Visit (INDEPENDENT_AMBULATORY_CARE_PROVIDER_SITE_OTHER): Payer: Medicare Other | Admitting: Internal Medicine

## 2012-03-09 ENCOUNTER — Encounter: Payer: Self-pay | Admitting: Internal Medicine

## 2012-03-09 DIAGNOSIS — E538 Deficiency of other specified B group vitamins: Secondary | ICD-10-CM

## 2012-03-09 MED ORDER — CYANOCOBALAMIN 1000 MCG/ML IJ SOLN
1000.0000 ug | Freq: Once | INTRAMUSCULAR | Status: AC
  Administered 2012-03-09: 1000 ug via INTRAMUSCULAR

## 2012-03-17 ENCOUNTER — Telehealth: Payer: Self-pay | Admitting: Internal Medicine

## 2012-03-17 DIAGNOSIS — N950 Postmenopausal bleeding: Secondary | ICD-10-CM

## 2012-03-17 DIAGNOSIS — R42 Dizziness and giddiness: Secondary | ICD-10-CM

## 2012-03-17 MED ORDER — MECLIZINE HCL 25 MG PO TABS: 25.0000 mg | ORAL_TABLET | Freq: Three times a day (TID) | ORAL | Status: DC | PRN

## 2012-03-17 NOTE — Telephone Encounter (Signed)
Left message asking patient to return my call.

## 2012-03-17 NOTE — Telephone Encounter (Signed)
Caller: Zamaria/Patient; Patient Name: Tiffany Mcbride; PCP: Duncan Dull; Best Callback Phone Number: [phone].  Called to request MRI.  Reports recurrent dizziness and headache.  Onset: 03/16/12.  Afebrile.  Seen 02/08/12 for dizziness; MD advised MRI if lab tests negative.  Also seen 03/02/12. Menopausal since age 71.  Notes mild vaginal bleeding X 4 days week of 03/07/12 related to use of vaginal medication for infection. Had cortisone shot from another provider approximately 03/03/12, 2 weeks ago, for chronic hip pain. Advised to see MD within 4 hours for abruptly stopped or decreased dose of corticosteroids per Dizziness or Vertigo Guideline. No apointments remain for 03/17/12.  Information noted and sent to [facility] pool for call back.  If caller does not answer, please leave message regarding appointment time as she has extensive dental appointment  Now and will not be able to answer the phone.

## 2012-03-17 NOTE — Telephone Encounter (Signed)
Patient notified, she stated Dr. Tiburcio Pea is fine, she does not have a preference.

## 2012-03-17 NOTE — Telephone Encounter (Signed)
She does not need an urgent appt.  I have seen her three times in the last 8 weeks.  I can order the MRI as we had previously dicussed. Will send rx for meclizine (anit vertigo medication) to her pharmacy.   2)   The vaginal bleeding needs to be evaluated by a gynecologist.  Vaginal bleedign in a post menopausal woman needs to rule out endometrial CA, so I will refer her to Dr. Tiburcio Pea  unless she has a preference for someone else.

## 2012-03-25 ENCOUNTER — Ambulatory Visit: Payer: Self-pay | Admitting: Internal Medicine

## 2012-03-29 ENCOUNTER — Telehealth: Payer: Self-pay | Admitting: Internal Medicine

## 2012-03-29 NOTE — Telephone Encounter (Signed)
MRI of Brain showed no acute changes , nothing to explain her chronic headaches and dizziness.

## 2012-03-29 NOTE — Telephone Encounter (Signed)
Left detailed message notifying patient.

## 2012-04-07 ENCOUNTER — Encounter: Payer: Self-pay | Admitting: Internal Medicine

## 2012-05-05 ENCOUNTER — Telehealth: Payer: Self-pay | Admitting: Internal Medicine

## 2012-05-05 DIAGNOSIS — L719 Rosacea, unspecified: Secondary | ICD-10-CM

## 2012-05-05 NOTE — Telephone Encounter (Signed)
Patient wants a referral to [facility] for  Rosacea .

## 2012-05-05 NOTE — Telephone Encounter (Signed)
Referral initiated.  Please aks her to call back id she has not heard from [place] in a weekl

## 2012-05-25 ENCOUNTER — Other Ambulatory Visit: Payer: Self-pay | Admitting: Internal Medicine

## 2012-06-23 ENCOUNTER — Ambulatory Visit (INDEPENDENT_AMBULATORY_CARE_PROVIDER_SITE_OTHER): Payer: Medicare Other | Admitting: Internal Medicine

## 2012-06-23 DIAGNOSIS — E538 Deficiency of other specified B group vitamins: Secondary | ICD-10-CM

## 2012-06-23 MED ORDER — CYANOCOBALAMIN 1000 MCG/ML IJ SOLN
1000.0000 ug | Freq: Once | INTRAMUSCULAR | Status: AC
  Administered 2012-06-23: 1000 ug via INTRAMUSCULAR

## 2012-06-26 NOTE — Progress Notes (Signed)
Patient ID: Tiffany Mcbride, female   DOB: 07-30-1941, 71 y.o.   MRN: [id number] Patient is here for an injection.

## 2012-06-28 ENCOUNTER — Telehealth: Payer: Self-pay | Admitting: Internal Medicine

## 2012-06-28 NOTE — Telephone Encounter (Signed)
Spoke to patient she states that she has taken those medication and it makes her sick. She states that on the back of her form they will make exceptions only if  Its up to the doctor.

## 2012-06-28 NOTE — Telephone Encounter (Signed)
Left message on patient vm to call me back regarding medication.

## 2012-06-28 NOTE — Telephone Encounter (Signed)
Tiffany Mcbride will not cover the anti inflammatory celebrex,they will  only reimburse for diclofenac, nabumetone and naproxen.  i will send alternative to pharmacy.  Does she have a preference?

## 2012-06-28 NOTE — Telephone Encounter (Signed)
Please initiate a prior authorization for celebrex so we can get this medication for patient,  patient cannot tolerate the medications they are asking [place] to substitute.

## 2012-07-01 ENCOUNTER — Other Ambulatory Visit: Payer: Self-pay

## 2012-07-01 MED ORDER — NABUMETONE 750 MG PO TABS: 750.0000 mg | ORAL_TABLET | Freq: Two times a day (BID) | ORAL | Status: DC

## 2012-07-01 NOTE — Telephone Encounter (Signed)
Per provider Nabumetone 750 mg #60 3 R sent electronic to [facility].

## 2012-07-07 NOTE — Telephone Encounter (Signed)
Sp[facility] to [place] [facility] she stated that patient got Rx Celebrex filled on 06/18/12.

## 2012-07-16 ENCOUNTER — Other Ambulatory Visit: Payer: Self-pay | Admitting: Internal Medicine

## 2012-07-18 ENCOUNTER — Other Ambulatory Visit: Payer: Self-pay

## 2012-07-25 ENCOUNTER — Ambulatory Visit (INDEPENDENT_AMBULATORY_CARE_PROVIDER_SITE_OTHER): Payer: Medicare Other | Admitting: Internal Medicine

## 2012-07-25 DIAGNOSIS — R5381 Other malaise: Secondary | ICD-10-CM

## 2012-07-25 MED ORDER — CYANOCOBALAMIN 1000 MCG/ML IJ SOLN
1000.0000 ug | Freq: Once | INTRAMUSCULAR | Status: AC
  Administered 2012-07-25: 1000 ug via INTRAMUSCULAR

## 2012-07-25 NOTE — Progress Notes (Signed)
  Subjective:    Patient ID: Tiffany Mcbride, female    DOB: 1941-04-09, 71 y.o.   MRN: [id number]  HPI  Nurse ONly  Review of Systems     Objective:   Physical Exam        Assessment & Plan:

## 2012-08-30 ENCOUNTER — Telehealth: Payer: Self-pay | Admitting: Internal Medicine

## 2012-08-30 NOTE — Telephone Encounter (Signed)
Patient is requesting a prior authorization request to [facility] for celebrex bc all other NSAIDs have caused gastritis. Please initiate

## 2012-08-31 NOTE — Telephone Encounter (Signed)
Hey this was sent to me the pt needs a PA started.

## 2012-09-02 ENCOUNTER — Telehealth: Payer: Self-pay | Admitting: *Deleted

## 2012-09-02 NOTE — Telephone Encounter (Signed)
Called [phone] for Prior Authorization on Celebrex 200 mg form being faxed over now

## 2012-10-27 ENCOUNTER — Telehealth: Payer: Self-pay | Admitting: General Practice

## 2012-10-27 NOTE — Telephone Encounter (Signed)
Pt called stating she is feeling fatigued takes B12 shots and not getting any better. States she was suppose to have a stress test last year but never kept the appointment. Could we please reschedule another one.

## 2012-12-01 ENCOUNTER — Other Ambulatory Visit: Payer: Self-pay | Admitting: Internal Medicine

## 2012-12-01 NOTE — Telephone Encounter (Signed)
Rx sent to pharmacy by escript  

## 2013-01-16 ENCOUNTER — Telehealth: Payer: Self-pay | Admitting: *Deleted

## 2013-01-16 ENCOUNTER — Other Ambulatory Visit: Payer: Self-pay | Admitting: Internal Medicine

## 2013-01-16 NOTE — Telephone Encounter (Signed)
Please Advise

## 2013-01-16 NOTE — Telephone Encounter (Signed)
Patient states she would like refill for meclizine. Last OV 07/25/12 please advise.

## 2013-01-17 MED ORDER — MECLIZINE HCL 25 MG PO TABS: 25.0000 mg | ORAL_TABLET | Freq: Three times a day (TID) | ORAL | Status: DC | PRN

## 2013-01-17 NOTE — Telephone Encounter (Signed)
Ok to refill,  Refill sent . It is availablee without prescription over-the-counter [facility]

## 2013-02-08 ENCOUNTER — Ambulatory Visit (INDEPENDENT_AMBULATORY_CARE_PROVIDER_SITE_OTHER): Payer: Medicare Other | Admitting: *Deleted

## 2013-02-08 DIAGNOSIS — R5381 Other malaise: Secondary | ICD-10-CM

## 2013-02-09 ENCOUNTER — Emergency Department: Payer: Self-pay

## 2013-02-09 DIAGNOSIS — R5383 Other fatigue: Secondary | ICD-10-CM

## 2013-02-09 LAB — COMPREHENSIVE METABOLIC PANEL
Alkaline Phosphatase: 126 U/L (ref 50–136)
Anion Gap: 7 (ref 7–16)
Bilirubin,Total: 0.5 mg/dL (ref 0.2–1.0)
Calcium, Total: 9.8 mg/dL (ref 8.5–10.1)
Chloride: 103 mmol/L (ref 98–107)
Co2: 25 mmol/L (ref 21–32)
EGFR (African American): >60
EGFR (Non-African Amer.): 52 — ABNORMAL LOW
Osmolality: 272 (ref 275–301)
Potassium: 3.8 mmol/L (ref 3.5–5.1)
SGOT(AST): 32 U/L (ref 15–37)
Sodium: 135 mmol/L — ABNORMAL LOW (ref 136–145)
Total Protein: 8.7 g/dL — ABNORMAL HIGH (ref 6.4–8.2)

## 2013-02-09 LAB — CBC
HCT: 44.6 % (ref 35.0–47.0)
HGB: 14.6 g/dL (ref 12.0–16.0)
MCV: 94 fL (ref 80–100)
RBC: 4.72 10*6/uL (ref 3.80–5.20)
WBC: 13.8 10*3/uL — ABNORMAL HIGH (ref 3.6–11.0)

## 2013-02-09 LAB — URINALYSIS, COMPLETE
Hyaline Cast: 1
Ph: 5 (ref 4.5–8.0)
Squamous Epithelial: 10
WBC UR: 9 /HPF (ref 0–5)

## 2013-02-09 MED ORDER — CYANOCOBALAMIN 1000 MCG/ML IJ SOLN
1000.0000 ug | Freq: Once | INTRAMUSCULAR | Status: AC
  Administered 2013-02-09: 1000 ug via INTRAMUSCULAR

## 2013-02-10 ENCOUNTER — Telehealth: Payer: Self-pay | Admitting: Internal Medicine

## 2013-02-10 DIAGNOSIS — R110 Nausea: Secondary | ICD-10-CM

## 2013-02-10 MED ORDER — PROMETHAZINE HCL 12.5 MG PO TABS: 12.5000 mg | ORAL_TABLET | Freq: Three times a day (TID) | ORAL | Status: DC | PRN

## 2013-02-10 NOTE — Telephone Encounter (Signed)
Pt was in the [facility] ER last night for Kidney Stones and the Nausea meds she was given her insurance requires Dr. Darrick Huntsman to ok it or perscribe another Nausea med. Please contact pt at [phone]. [facility]

## 2013-02-10 NOTE — Telephone Encounter (Signed)
Pt.notified

## 2013-02-10 NOTE — Telephone Encounter (Signed)
[facility]  Make sure she is drinking plenty of water.

## 2013-02-10 NOTE — Telephone Encounter (Signed)
Patient was prescribed by ED Zofran 4 mg for kidney stone and patient insurance will not cover without pre- authorization. NO samples in med room.

## 2013-02-15 ENCOUNTER — Telehealth: Payer: Self-pay | Admitting: *Deleted

## 2013-02-15 DIAGNOSIS — N200 Calculus of kidney: Secondary | ICD-10-CM

## 2013-02-15 NOTE — Telephone Encounter (Signed)
No,  i have placed referral.

## 2013-02-15 NOTE — Telephone Encounter (Signed)
Left message for patient to return call to office. 

## 2013-02-15 NOTE — Telephone Encounter (Signed)
Patient reports being seen in ED last week for Kidney stone and was advised to see Dr. Sheppard Penton as they do not except medicaid/ Medicare patient called [name] but was told she would need a referral can we make referral or does patient need to be seen by you first?

## 2013-02-16 NOTE — Telephone Encounter (Signed)
Left message to call back  

## 2013-02-28 ENCOUNTER — Ambulatory Visit: Payer: Self-pay | Admitting: Urology

## 2013-03-02 ENCOUNTER — Other Ambulatory Visit: Payer: Self-pay | Admitting: Urology

## 2013-03-06 ENCOUNTER — Encounter (HOSPITAL_BASED_OUTPATIENT_CLINIC_OR_DEPARTMENT_OTHER): Payer: Self-pay | Admitting: *Deleted

## 2013-03-06 NOTE — Progress Notes (Signed)
NPO AFTER MN WITH EXCEPTION CLEAR LIQUIDS UNTIL 0800 (NO CREAM/ MILK PRODUCTS). ARRIVES AT 1245. NEEDS ISTAT 8. WILL TAKE NEXIUM AM OF SURG W/ SIP OF WATER AND IF NEEDED RX PAIN/ NAUSEA .

## 2013-03-09 ENCOUNTER — Encounter (HOSPITAL_BASED_OUTPATIENT_CLINIC_OR_DEPARTMENT_OTHER)
Admission: RE | Disposition: A | Discharge status: Home / Self Care | Payer: Self-pay | Source: Ambulatory Visit | Attending: Urology

## 2013-03-09 ENCOUNTER — Ambulatory Visit (HOSPITAL_BASED_OUTPATIENT_CLINIC_OR_DEPARTMENT_OTHER)
Admission: RE | Admit: 2013-03-09 | Discharge: 2013-03-09 | Disposition: A | Discharge status: Home / Self Care | Payer: Medicare Other | Source: Ambulatory Visit | Attending: Urology | Admitting: Urology

## 2013-03-09 ENCOUNTER — Ambulatory Visit (HOSPITAL_COMMUNITY): Payer: Medicare Other

## 2013-03-09 ENCOUNTER — Encounter (HOSPITAL_BASED_OUTPATIENT_CLINIC_OR_DEPARTMENT_OTHER): Payer: Self-pay | Admitting: Anesthesiology

## 2013-03-09 ENCOUNTER — Encounter (HOSPITAL_BASED_OUTPATIENT_CLINIC_OR_DEPARTMENT_OTHER): Payer: Self-pay

## 2013-03-09 ENCOUNTER — Ambulatory Visit (HOSPITAL_BASED_OUTPATIENT_CLINIC_OR_DEPARTMENT_OTHER): Payer: Medicare Other | Admitting: Anesthesiology

## 2013-03-09 DIAGNOSIS — N201 Calculus of ureter: Secondary | ICD-10-CM | POA: Insufficient documentation

## 2013-03-09 DIAGNOSIS — R109 Unspecified abdominal pain: Secondary | ICD-10-CM | POA: Insufficient documentation

## 2013-03-09 DIAGNOSIS — K219 Gastro-esophageal reflux disease without esophagitis: Secondary | ICD-10-CM | POA: Insufficient documentation

## 2013-03-09 DIAGNOSIS — N1330 Unspecified hydronephrosis: Secondary | ICD-10-CM | POA: Insufficient documentation

## 2013-03-09 HISTORY — PX: CYSTOSCOPY WITH RETROGRADE PYELOGRAM, URETEROSCOPY AND STENT PLACEMENT: SHX5789

## 2013-03-09 HISTORY — DX: Personal history of other diseases of the digestive system: Z87.19

## 2013-03-09 HISTORY — DX: Unspecified glaucoma: H40.9

## 2013-03-09 HISTORY — DX: Unspecified osteoarthritis, unspecified site: M19.90

## 2013-03-09 HISTORY — DX: Personal history of peptic ulcer disease: Z87.11

## 2013-03-09 HISTORY — DX: Gastro-esophageal reflux disease without esophagitis: K21.9

## 2013-03-09 HISTORY — DX: Urgency of urination: R39.15

## 2013-03-09 HISTORY — DX: Calculus of ureter: N20.1

## 2013-03-09 LAB — POCT I-STAT, CHEM 8
BUN: 18 mg/dL (ref 6–23)
Creatinine, Ser: 0.9 mg/dL (ref 0.50–1.10)
Potassium: 3.7 mEq/L (ref 3.5–5.1)
Sodium: 142 mEq/L (ref 135–145)

## 2013-03-09 SURGERY — CYSTOURETEROSCOPY, WITH RETROGRADE PYELOGRAM AND STENT INSERTION
Anesthesia: General | Site: Ureter | Laterality: Left | Wound class: Clean Contaminated

## 2013-03-09 MED ORDER — TRAMADOL HCL 50 MG PO TABS: 50.0000 mg | ORAL_TABLET | Freq: Four times a day (QID) | ORAL | Status: DC | PRN

## 2013-03-09 MED ORDER — SENNOSIDES-DOCUSATE SODIUM 8.6-50 MG PO TABS: 1.0000 | ORAL_TABLET | Freq: Two times a day (BID) | ORAL | Status: DC

## 2013-03-09 MED ORDER — FENTANYL CITRATE 0.05 MG/ML IJ SOLN
INTRAMUSCULAR | Status: DC | PRN
  Administered 2013-03-09: 25 ug via INTRAVENOUS
  Administered 2013-03-09: 50 ug via INTRAVENOUS
  Administered 2013-03-09: 25 ug via INTRAVENOUS

## 2013-03-09 MED ORDER — CIPROFLOXACIN HCL 500 MG PO TABS: 500.0000 mg | ORAL_TABLET | Freq: Every day | ORAL | Status: DC

## 2013-03-09 MED ORDER — FENTANYL CITRATE 0.05 MG/ML IJ SOLN
25.0000 ug | INTRAMUSCULAR | Status: DC | PRN
  Administered 2013-03-09 (×2): 50 ug via INTRAVENOUS
  Filled 2013-03-09: qty 1

## 2013-03-09 MED ORDER — ONDANSETRON HCL 4 MG/2ML IJ SOLN
INTRAMUSCULAR | Status: DC | PRN
  Administered 2013-03-09: 4 mg via INTRAVENOUS

## 2013-03-09 MED ORDER — PROMETHAZINE HCL 25 MG/ML IJ SOLN
6.2500 mg | INTRAMUSCULAR | Status: DC | PRN
  Administered 2013-03-09: 6.25 mg via INTRAVENOUS
  Filled 2013-03-09: qty 1

## 2013-03-09 MED ORDER — LIDOCAINE HCL (CARDIAC) 20 MG/ML IV SOLN
INTRAVENOUS | Status: DC | PRN
  Administered 2013-03-09: 80 mg via INTRAVENOUS

## 2013-03-09 MED ORDER — PROPOFOL 10 MG/ML IV BOLUS
INTRAVENOUS | Status: DC | PRN
  Administered 2013-03-09: 170 mg via INTRAVENOUS

## 2013-03-09 MED ORDER — SODIUM CHLORIDE 0.9 % IR SOLN
Status: DC | PRN
  Administered 2013-03-09: 6000 mL via INTRAVESICAL

## 2013-03-09 MED ORDER — TRAMADOL HCL 50 MG PO TABS
50.0000 mg | ORAL_TABLET | Freq: Four times a day (QID) | ORAL | Status: DC | PRN
  Administered 2013-03-09: 50 mg via ORAL
  Filled 2013-03-09: qty 1

## 2013-03-09 MED ORDER — DEXAMETHASONE SODIUM PHOSPHATE 4 MG/ML IJ SOLN
INTRAMUSCULAR | Status: DC | PRN
  Administered 2013-03-09: 8 mg via INTRAVENOUS

## 2013-03-09 MED ORDER — MIDAZOLAM HCL 5 MG/5ML IJ SOLN
INTRAMUSCULAR | Status: DC | PRN
  Administered 2013-03-09: 1 mg via INTRAVENOUS

## 2013-03-09 MED ORDER — IOHEXOL 350 MG/ML SOLN
INTRAVENOUS | Status: DC | PRN
  Administered 2013-03-09: 7 mL

## 2013-03-09 MED ORDER — GENTAMICIN SULFATE 40 MG/ML IJ SOLN
330.0000 mg | Freq: Once | INTRAVENOUS | Status: DC
  Filled 2013-03-09: qty 8.25

## 2013-03-09 MED ORDER — LACTATED RINGERS IV SOLN
INTRAVENOUS | Status: DC
  Administered 2013-03-09 (×2): via INTRAVENOUS
  Filled 2013-03-09: qty 1000

## 2013-03-09 MED ORDER — GENTAMICIN IN SALINE 1.6-0.9 MG/ML-% IV SOLN
80.0000 mg | INTRAVENOUS | Status: DC
  Filled 2013-03-09: qty 50

## 2013-03-09 SURGICAL SUPPLY — 43 items
ADAPTER CATH URET PLST 4-6FR (CATHETERS) IMPLANT
ADPR CATH URET STRL DISP 4-6FR (CATHETERS)
BAG DRAIN URO-CYSTO SKYTR STRL (DRAIN) ×3 IMPLANT
BAG DRN UROCATH (DRAIN) ×2
BAG URO CATCHER STRL LF (DRAPE) ×1 IMPLANT
BASKET LASER NITINOL 1.9FR (BASKET) ×3 IMPLANT
BASKET STNLS GEMINI 4WIRE 3FR (BASKET) IMPLANT
BASKET ZERO TIP NITINOL 2.4FR (BASKET) IMPLANT
BRUSH URET BIOPSY 3F (UROLOGICAL SUPPLIES) IMPLANT
BSKT STON RTRVL 120 1.9FR (BASKET) ×2
BSKT STON RTRVL GEM 120X11 3FR (BASKET)
BSKT STON RTRVL ZERO TP 2.4FR (BASKET)
CANISTER SUCT LVC 12 LTR MEDI- (MISCELLANEOUS) ×2 IMPLANT
CATH FOLEY 2WAY  3CC  8FR (CATHETERS)
CATH FOLEY 2WAY 3CC 8FR (CATHETERS) IMPLANT
CATH INTERMIT  6FR 70CM (CATHETERS) ×2 IMPLANT
CATH URET 5FR 28IN CONE TIP (BALLOONS)
CATH URET 5FR 28IN OPEN ENDED (CATHETERS) IMPLANT
CATH URET 5FR 70CM CONE TIP (BALLOONS) IMPLANT
CLOTH BEACON ORANGE TIMEOUT ST (SAFETY) ×3 IMPLANT
DRAPE CAMERA CLOSED 9X96 (DRAPES) ×3 IMPLANT
ELECT REM PT RETURN 9FT ADLT (ELECTROSURGICAL)
ELECTRODE REM PT RTRN 9FT ADLT (ELECTROSURGICAL) IMPLANT
GLOVE BIO SURGEON STRL SZ7.5 (GLOVE) ×3 IMPLANT
GLOVE BIOGEL M STER SZ 6 (GLOVE) ×4 IMPLANT
GOWN PREVENTION PLUS LG XLONG (DISPOSABLE) ×4 IMPLANT
GOWN STRL REIN XL XLG (GOWN DISPOSABLE) ×3 IMPLANT
GUIDEWIRE 0.038 PTFE COATED (WIRE) IMPLANT
GUIDEWIRE ANG ZIPWIRE 038X150 (WIRE) ×3 IMPLANT
GUIDEWIRE STR DUAL SENSOR (WIRE) ×3 IMPLANT
IV NS IRRIG 3000ML ARTHROMATIC (IV SOLUTION) ×6 IMPLANT
KIT BALLIN UROMAX 15FX10 (LABEL) IMPLANT
KIT BALLN UROMAX 15FX4 (MISCELLANEOUS) IMPLANT
KIT BALLN UROMAX 26 75X4 (MISCELLANEOUS)
PACK CYSTOSCOPY (CUSTOM PROCEDURE TRAY) ×3 IMPLANT
SET HIGH PRES BAL DIL (LABEL)
SHEATH ACCESS URETERAL 24CM (SHEATH) ×2 IMPLANT
SHEATH URET ACCESS 12FR/35CM (UROLOGICAL SUPPLIES) IMPLANT
SHEATH URET ACCESS 12FR/55CM (UROLOGICAL SUPPLIES) IMPLANT
STENT URET 6FRX24 CONTOUR (STENTS) ×2 IMPLANT
SYRINGE 10CC LL (SYRINGE) ×3 IMPLANT
SYRINGE IRR TOOMEY STRL 70CC (SYRINGE) IMPLANT
TUBE FEEDING 8FR 16IN STR KANG (MISCELLANEOUS) IMPLANT

## 2013-03-09 NOTE — Brief Op Note (Signed)
03/09/2013  2:43 PM  PATIENT:  Tiffany Mcbride  72 y.o. female  PRE-OPERATIVE DIAGNOSIS:  LEFT URETERAL STONE  POST-OPERATIVE DIAGNOSIS:  * No post-op diagnosis entered *  PROCEDURE:  Procedure(s): CYSTOSCOPY WITH RETROGRADE PYELOGRAM, URETEROSCOPY AND STENT PLACEMENT (Left) HOLMIUM LASER APPLICATION (Left)  SURGEON:  Surgeon(s) and Role:    * Sebastian Ache, MD - Primary  PHYSICIAN ASSISTANT:   ASSISTANTS: none   ANESTHESIA:   general  EBL:  Total I/O In: 200 [I.V.:200] Out: -   BLOOD ADMINISTERED:none  DRAINS: none   LOCAL MEDICATIONS USED:  MARCAINE     SPECIMEN:  Source of Specimen:  Left Ureteral Stone  DISPOSITION OF SPECIMEN:  [facility] for compositional analysis  COUNTS:  YES  TOURNIQUET:  * No tourniquets in log *  DICTATION: .Other Dictation: Dictation Number [id number]  PLAN OF CARE: Discharge to home after PACU  PATIENT DISPOSITION:  PACU - hemodynamically stable.   Delay start of Pharmacological VTE agent (>24hrs) due to surgical blood loss or risk of bleeding: not applicable

## 2013-03-09 NOTE — Anesthesia Procedure Notes (Signed)
Procedure Name: LMA Insertion Date/Time: 03/09/2013 2:14 PM Performed by: Renella Cunas D Pre-anesthesia Checklist: Patient identified, Emergency Drugs available, Suction available and Patient being monitored Patient Re-evaluated:Patient Re-evaluated prior to inductionOxygen Delivery Method: Circle System Utilized Preoxygenation: Pre-oxygenation with 100% oxygen Intubation Type: IV induction Ventilation: Mask ventilation without difficulty LMA: LMA inserted LMA Size: 4.0 Number of attempts: 1 Airway Equipment and Method: bite block Placement Confirmation: positive ETCO2 Tube secured with: Tape Dental Injury: Teeth and Oropharynx as per pre-operative assessment

## 2013-03-09 NOTE — Anesthesia Postprocedure Evaluation (Signed)
  Anesthesia Post-op Note  Patient: Tiffany Mcbride  Procedure(s) Performed: Procedure(s) (LRB): CYSTOSCOPY WITH RETROGRADE PYELOGRAM, URETEROSCOPY AND LEFT STENT PLACEMENT (Left)  Patient Location: PACU  Anesthesia Type: General  Level of Consciousness: awake and alert   Airway and Oxygen Therapy: Patient Spontanous Breathing  Post-op Pain: mild  Post-op Assessment: Post-op Vital signs reviewed, Patient's Cardiovascular Status Stable, Respiratory Function Stable, Patent Airway and No signs of Nausea or vomiting  Last Vitals:  Filed Vitals:   03/09/13 1600  BP: 138/57  Pulse: 66  Temp:   Resp: 16    Post-op Vital Signs: stable   Complications: No apparent anesthesia complications

## 2013-03-09 NOTE — Transfer of Care (Signed)
Immediate Anesthesia Transfer of Care Note  Patient: Tiffany Mcbride  Procedure(s) Performed: Procedure(s): CYSTOSCOPY WITH RETROGRADE PYELOGRAM, URETEROSCOPY AND LEFT STENT PLACEMENT (Left)  Patient Location: PACU  Anesthesia Type:General  Level of Consciousness: sedated  Airway & Oxygen Therapy: Patient Spontanous Breathing and Patient connected to nasal cannula oxygen  Post-op Assessment:stable  Post vital signs: stable  Complications: No apparent anesthesia complications

## 2013-03-09 NOTE — Anesthesia Preprocedure Evaluation (Addendum)
Anesthesia Evaluation  Patient identified by MRN, date of birth, ID band Patient awake  General Assessment Comment:  Left ureteral calculus     .  Glaucoma of both eyes     .  Urgency of urination     .  Arthritis     .  History of gastric ulcer     .  GERD (gastroesophageal reflux disease)     Reviewed: Allergy & Precautions, H&P , NPO status , Patient's Chart, lab work & pertinent test results  Airway Mallampati: II TM Distance: >3 FB Neck ROM: Full    Dental no notable dental hx.    Pulmonary neg pulmonary ROS,  breath sounds clear to auscultation  Pulmonary exam normal       Cardiovascular Exercise Tolerance: Good negative cardio ROS  Rhythm:Regular Rate:Normal     Neuro/Psych negative neurological ROS  negative psych ROS   GI/Hepatic Neg liver ROS, GERD-  Medicated,  Endo/Other  negative endocrine ROS  Renal/GU negative Renal ROS  negative genitourinary   Musculoskeletal negative musculoskeletal ROS (+)   Abdominal   Peds negative pediatric ROS (+)  Hematology negative hematology ROS (+)   Anesthesia Other Findings   Reproductive/Obstetrics negative OB ROS                          Anesthesia Physical Anesthesia Plan  ASA: II  Anesthesia Plan: General   Post-op Pain Management:    Induction: Intravenous  Airway Management Planned: LMA  Additional Equipment:   Intra-op Plan:   Post-operative Plan: Extubation in OR  Informed Consent: I have reviewed the patients History and Physical, chart, labs and discussed the procedure including the risks, benefits and alternatives for the proposed anesthesia with the patient or authorized representative who has indicated his/her understanding and acceptance.   Dental advisory given  Plan Discussed with: CRNA  Anesthesia Plan Comments:         Anesthesia Quick Evaluation

## 2013-03-09 NOTE — H&P (Signed)
Tiffany Mcbride is an 72 y.o. female.    Chief Complaint: Pre-Op Left Ureteroscopic Stone Manipulation  HPI:   1 - Left Ureteral Stone - pt with left 2mm ureteral stone by CT at [facility] ER 01/2013 on w/u colickly flank pain. Pt given trial of medical therapy with alpha blockers / pain / nausea meds but has not passed stone and symptoms have been refractory. No interval fevers.  PMH sig for lap chole, bladder sling. No CV disease. No strong blood thinners.   Past Medical History  Diagnosis Date  . Left ureteral calculus   . Glaucoma of both eyes   . Urgency of urination   . Arthritis   . History of gastric ulcer   . GERD (gastroesophageal reflux disease)     Past Surgical History  Procedure Laterality Date  . Pubovaginal sling  1995  . Endometrial ablation    . Cervical spine surgery  1992    posterior diskectomy  . Tubal ligation    . Laparoscopic cholecystectomy  1998    Family History  Problem Relation Age of Onset  . Hypertension Mother   . Heart disease Father   . Cancer Sister     metastatic liver Ca  . Heart disease Brother   . Cancer Brother     Lung   Social History:  reports that she has never smoked. She has never used smokeless tobacco. She reports that she does not drink alcohol or use illicit drugs.  Allergies:  Allergies  Allergen Reactions  . Codeine Hives  . Sulfa Antibiotics Hives    No prescriptions prior to admission    No results found for this or any previous visit (from the past 48 hour(s)). No results found.  Review of Systems  Constitutional: Negative.  Negative for fever and chills.  HENT: Negative.   Eyes: Negative.   Respiratory: Negative.   Cardiovascular: Negative.   Gastrointestinal: Positive for nausea. Negative for vomiting.  Genitourinary: Positive for flank pain.  Musculoskeletal: Negative.   Skin: Negative.   Neurological: Negative.   Endo/Heme/Allergies: Negative.   Psychiatric/Behavioral: Negative.      Height 5\' 3"  (1.6 m), weight 81.647 kg (180 lb). Physical Exam  Constitutional: She is oriented to person, place, and time. She appears well-developed and well-nourished.  HENT:  Head: Normocephalic and atraumatic.  Eyes: EOM are normal. Pupils are equal, round, and reactive to light.  Neck: Normal range of motion. Neck supple.  Cardiovascular: Normal rate and regular rhythm.   Respiratory: Effort normal and breath sounds normal.  GI: Soft. Bowel sounds are normal.  Genitourinary:  Mild Left CVAT  Musculoskeletal: Normal range of motion.  Neurological: She is alert and oriented to person, place, and time.  Skin: Skin is warm and dry.  Psychiatric: She has a normal mood and affect. Her behavior is normal. Judgment and thought content normal.     Assessment/Plan  1 - Left Ureteral Stone - Small stone, but persistent and symptomatic. Pt adamantly wants definitive treatment with ureteroscopy as previously discussed.  We rediscussed ureteroscopic stone manipulation with basketing and laser-lithotripsy in detail.  We rediscussed risks including bleeding, infection, damage to kidney / ureter  bladder, rarely loss of kidney. We rediscussed anesthetic risks and rare but serious surgical complications including DVT, PE, MI, and mortality. We specifically readdressed that in 5-10% of cases a staged approach is required with stenting followed by re-attempt ureteroscopy if anatomy unfavorable. The patient voiced understanding and wises to proceed.  [name] 03/09/2013, 6:14 AM

## 2013-03-10 NOTE — Op Note (Signed)
NAME:  Tiffany, Mcbride NO.:  [id number]  MEDICAL RECORD NO.:  [id number]  LOCATION:                                 FACILITY:  PHYSICIAN:  Sebastian Ache, MD          DATE OF BIRTH:  DATE OF PROCEDURE:  03/09/2013 DATE OF DISCHARGE:                              OPERATIVE REPORT   DIAGNOSIS:  Left ureteral stone, refractory flank pain.  PROCEDURES: 1. Cystoscopy with left retrograde pyelogram and interpretation. 2. Left ureteroscopy with basketing of ureteral stone. 3. Placement of left ureteral stent, 6 x 24, with tether to the left     thigh.  ESTIMATED BLOOD LOSS:  Nil.  SPECIMEN:  Left ureteral stone for compositional analysis.  FINDINGS: 1. Unremarkable bladder. 2. Left distal ureteral stone with very mild proximal     hydroureteronephrosis.  INDICATIONS:  Tiffany Mcbride is a pleasant 72 year old lady who was found on workup to have flank pain at [facility], a very avoid 2-mm in diameter left ureteral stone.  She underwent initial trial of medical social therapy several weeks; however, her symptoms remained refractory.  She was seen at our [facility] where options were discussed including continued medical therapy versus ureteroscopic stone manipulation versus lithotripsy and she wished to proceed with ureteroscopy.  Informed consent was obtained and placed in the medical record.  PROCEDURE IN DETAIL:  The patient being Tiffany Mcbride, was verified. Procedure being left ureteroscopic stone manipulation was confirmed. The procedure was carried out.  Time-out was performed.  Intravenous antibiotics were administered.  General LMA anesthesia was introduced. The patient was placed into a low lithotomy position.  Sterile field was created by prepping and draping the patient's vagina, introitus, and proximal thighs using iodine x3.  Next, cystourethroscopy was performed using a 22-French rigid cystoscope with 12-degree  offset lens. Inspection of the urinary bladder revealed no diverticula, calcifications, or papillary lesions.  The left ureteral orifice was cannulated with a 6-French end-hole catheter and left retrograde pyelogram was obtained.  Left retrograde pyelogram demonstrated a single left ureter, single system left kidney.  There was a step-off in caliber in the distal third of the ureter as well as a filling defect consistent with known stone with very mild hydroureteronephrosis.  Above this, a 0.038 Glidewire was advanced at the level of the upper pole and set aside as a safety wire. Next, semi-rigid ureteroscopy was performed in the entire length of the left ureter alongside a separate Sensor working wire.  In the distal ureter, the of avoid stone in question was encountered.  It was grasped with an escape basket on its long axis and removed in its entirety and set aside for compositional analysis.  Next, a semi-rigid ureteroscope was exchanged for 12/14 ureteral access sheath over the sensor working wire to the level of the midureter.  Next, flexible ureteroscopy was performed in entire length of the proximal left ureter as well as systematic inspection of each calyx of the left kidney x2.  No additional calcifications were found.  No evidence of perforation. There was excellent hemostasis.  The sheath was removed under continuous ureteroscopic vision  and no mucosal abnormalities were found.  There was a little bit of mucosal edema in the distal ureter at the site of previous stone impaction; as such, a new 6 x 24 double-J stent was placed over the remaining safety wire using fluoroscopic guidance.  Good proximal and distal curl were noted.  Tether was left in place and fashioned to the left thigh.  Procedure was then terminated.  The patient tolerated the procedure well.  There were no immediate periprocedural complications.  The patient was taken to the postanesthesia care unit in  stable condition.          ______________________________ Sebastian Ache, MD     TM/MEDQ  D:  03/09/2013  T:  03/10/2013  Job:  [id number]

## 2013-03-14 ENCOUNTER — Encounter (HOSPITAL_BASED_OUTPATIENT_CLINIC_OR_DEPARTMENT_OTHER): Payer: Self-pay | Admitting: Urology

## 2013-04-26 ENCOUNTER — Ambulatory Visit: Payer: Self-pay | Admitting: Nurse Practitioner

## 2013-05-19 ENCOUNTER — Other Ambulatory Visit: Payer: Self-pay | Admitting: Internal Medicine

## 2013-05-19 NOTE — Telephone Encounter (Signed)
The patient is wanting samples of Nexium until she can get her prescription filled.

## 2013-05-22 NOTE — Telephone Encounter (Signed)
Sample left up front, pt notified.

## 2013-05-24 ENCOUNTER — Ambulatory Visit: Payer: Self-pay | Admitting: Nurse Practitioner

## 2013-08-24 ENCOUNTER — Other Ambulatory Visit: Payer: Self-pay | Admitting: Internal Medicine

## 2013-08-25 NOTE — Telephone Encounter (Signed)
Refill denied needs OV

## 2013-08-25 NOTE — Telephone Encounter (Signed)
Last seen 03/02/12. Okay to refill?

## 2013-08-30 ENCOUNTER — Other Ambulatory Visit: Payer: Self-pay | Admitting: Internal Medicine

## 2013-11-02 ENCOUNTER — Ambulatory Visit: Payer: Self-pay | Admitting: Nurse Practitioner

## 2013-11-28 ENCOUNTER — Telehealth: Payer: Self-pay | Admitting: Internal Medicine

## 2013-11-28 DIAGNOSIS — E538 Deficiency of other specified B group vitamins: Secondary | ICD-10-CM | POA: Insufficient documentation

## 2013-11-28 NOTE — Telephone Encounter (Signed)
Yes, she can come in for b12 needs office visit

## 2013-11-28 NOTE — Telephone Encounter (Signed)
Pt called wanting to schedule a b12 pt  She has not been seen here since 03/02/12.  Pt stated she has been going to a [place] in [place] wasn't for sure her name.  She stated she gets her b12's done there when she visits her sister she has also had labs done there  Is it ok to schedule

## 2013-11-28 NOTE — Telephone Encounter (Signed)
Please advise 

## 2013-11-28 NOTE — Telephone Encounter (Signed)
Yes,  i will add b12 deficiency to her med list . Needs office visit ASAP

## 2013-11-29 NOTE — Telephone Encounter (Signed)
Notified patient and she stated she will call and schedule appointment

## 2014-01-12 ENCOUNTER — Ambulatory Visit: Payer: Self-pay | Admitting: Internal Medicine

## 2014-01-16 ENCOUNTER — Emergency Department: Payer: Self-pay | Admitting: Emergency Medicine

## 2014-01-16 LAB — COMPREHENSIVE METABOLIC PANEL
ALT: 31 U/L (ref 12–78)
Albumin: 4 g/dL (ref 3.4–5.0)
Alkaline Phosphatase: 97 U/L
Anion Gap: 3 — ABNORMAL LOW (ref 7–16)
BUN: 21 mg/dL — ABNORMAL HIGH (ref 7–18)
Bilirubin,Total: 0.5 mg/dL (ref 0.2–1.0)
CALCIUM: 9.2 mg/dL (ref 8.5–10.1)
CHLORIDE: 107 mmol/L (ref 98–107)
CO2: 27 mmol/L (ref 21–32)
Creatinine: 0.97 mg/dL (ref 0.60–1.30)
EGFR (African American): >60
EGFR (Non-African Amer.): 59 — ABNORMAL LOW
GLUCOSE: 127 mg/dL — AB (ref 65–99)
Osmolality: 278 (ref 275–301)
POTASSIUM: 4 mmol/L (ref 3.5–5.1)
SGOT(AST): 35 U/L (ref 15–37)
SODIUM: 137 mmol/L (ref 136–145)
TOTAL PROTEIN: 7.7 g/dL (ref 6.4–8.2)

## 2014-01-16 LAB — TROPONIN I: Troponin-I: <0.02 ng/mL

## 2014-01-16 LAB — CK TOTAL AND CKMB (NOT AT ARMC)
CK, TOTAL: 120 U/L
CK-MB: 1.3 ng/mL (ref 0.5–3.6)

## 2014-01-16 LAB — CBC
HCT: 44.8 % (ref 35.0–47.0)
HGB: 14.9 g/dL (ref 12.0–16.0)
MCH: 32.2 pg (ref 26.0–34.0)
MCHC: 33.3 g/dL (ref 32.0–36.0)
MCV: 97 fL (ref 80–100)
Platelet: 284 10*3/uL (ref 150–440)
RBC: 4.63 10*6/uL (ref 3.80–5.20)
RDW: 13.4 % (ref 11.5–14.5)
WBC: 15.4 10*3/uL — ABNORMAL HIGH (ref 3.6–11.0)

## 2014-03-19 ENCOUNTER — Ambulatory Visit: Payer: Self-pay | Admitting: Internal Medicine

## 2014-08-14 DIAGNOSIS — D510 Vitamin B12 deficiency anemia due to intrinsic factor deficiency: Secondary | ICD-10-CM | POA: Diagnosis not present

## 2014-09-19 DIAGNOSIS — E538 Deficiency of other specified B group vitamins: Secondary | ICD-10-CM | POA: Diagnosis not present

## 2014-09-19 DIAGNOSIS — D510 Vitamin B12 deficiency anemia due to intrinsic factor deficiency: Secondary | ICD-10-CM | POA: Diagnosis not present

## 2014-10-23 DIAGNOSIS — D510 Vitamin B12 deficiency anemia due to intrinsic factor deficiency: Secondary | ICD-10-CM | POA: Diagnosis not present

## 2014-10-23 DIAGNOSIS — E538 Deficiency of other specified B group vitamins: Secondary | ICD-10-CM | POA: Diagnosis not present

## 2014-11-28 DIAGNOSIS — R7989 Other specified abnormal findings of blood chemistry: Secondary | ICD-10-CM | POA: Insufficient documentation

## 2014-11-28 DIAGNOSIS — E538 Deficiency of other specified B group vitamins: Secondary | ICD-10-CM | POA: Insufficient documentation

## 2014-11-28 DIAGNOSIS — D510 Vitamin B12 deficiency anemia due to intrinsic factor deficiency: Secondary | ICD-10-CM | POA: Diagnosis not present

## 2014-12-04 DIAGNOSIS — H40003 Preglaucoma, unspecified, bilateral: Secondary | ICD-10-CM | POA: Diagnosis not present

## 2015-01-04 DIAGNOSIS — E538 Deficiency of other specified B group vitamins: Secondary | ICD-10-CM | POA: Diagnosis not present

## 2015-02-05 DIAGNOSIS — E538 Deficiency of other specified B group vitamins: Secondary | ICD-10-CM | POA: Diagnosis not present

## 2015-03-08 DIAGNOSIS — E538 Deficiency of other specified B group vitamins: Secondary | ICD-10-CM | POA: Diagnosis not present

## 2015-04-10 DIAGNOSIS — E538 Deficiency of other specified B group vitamins: Secondary | ICD-10-CM | POA: Diagnosis not present

## 2015-05-15 DIAGNOSIS — E538 Deficiency of other specified B group vitamins: Secondary | ICD-10-CM | POA: Diagnosis not present

## 2015-05-16 DIAGNOSIS — Z23 Encounter for immunization: Secondary | ICD-10-CM | POA: Diagnosis not present

## 2015-06-18 DIAGNOSIS — E538 Deficiency of other specified B group vitamins: Secondary | ICD-10-CM | POA: Diagnosis not present

## 2015-06-20 DIAGNOSIS — B351 Tinea unguium: Secondary | ICD-10-CM | POA: Diagnosis not present

## 2015-06-20 DIAGNOSIS — M79675 Pain in left toe(s): Secondary | ICD-10-CM | POA: Diagnosis not present

## 2015-06-20 DIAGNOSIS — M79674 Pain in right toe(s): Secondary | ICD-10-CM | POA: Diagnosis not present

## 2015-08-07 DIAGNOSIS — R0789 Other chest pain: Secondary | ICD-10-CM | POA: Diagnosis not present

## 2015-08-07 DIAGNOSIS — D72829 Elevated white blood cell count, unspecified: Secondary | ICD-10-CM | POA: Diagnosis not present

## 2015-08-07 DIAGNOSIS — E669 Obesity, unspecified: Secondary | ICD-10-CM | POA: Diagnosis not present

## 2015-08-07 DIAGNOSIS — E860 Dehydration: Secondary | ICD-10-CM | POA: Diagnosis not present

## 2015-08-07 DIAGNOSIS — Z79899 Other long term (current) drug therapy: Secondary | ICD-10-CM | POA: Diagnosis not present

## 2015-08-07 DIAGNOSIS — R109 Unspecified abdominal pain: Secondary | ICD-10-CM | POA: Diagnosis not present

## 2015-08-07 DIAGNOSIS — R1013 Epigastric pain: Secondary | ICD-10-CM | POA: Diagnosis not present

## 2015-08-07 DIAGNOSIS — M1990 Unspecified osteoarthritis, unspecified site: Secondary | ICD-10-CM | POA: Diagnosis not present

## 2015-08-07 DIAGNOSIS — R10819 Abdominal tenderness, unspecified site: Secondary | ICD-10-CM | POA: Diagnosis not present

## 2015-08-07 DIAGNOSIS — Z6838 Body mass index (BMI) 38.0-38.9, adult: Secondary | ICD-10-CM | POA: Diagnosis not present

## 2015-08-07 DIAGNOSIS — D72819 Decreased white blood cell count, unspecified: Secondary | ICD-10-CM | POA: Diagnosis not present

## 2015-08-07 DIAGNOSIS — K529 Noninfective gastroenteritis and colitis, unspecified: Secondary | ICD-10-CM | POA: Diagnosis not present

## 2015-08-07 DIAGNOSIS — K219 Gastro-esophageal reflux disease without esophagitis: Secondary | ICD-10-CM | POA: Diagnosis not present

## 2015-08-07 DIAGNOSIS — R748 Abnormal levels of other serum enzymes: Secondary | ICD-10-CM | POA: Diagnosis not present

## 2015-08-07 DIAGNOSIS — Z743 Need for continuous supervision: Secondary | ICD-10-CM | POA: Diagnosis not present

## 2015-08-08 DIAGNOSIS — E669 Obesity, unspecified: Secondary | ICD-10-CM | POA: Diagnosis not present

## 2015-08-08 DIAGNOSIS — K219 Gastro-esophageal reflux disease without esophagitis: Secondary | ICD-10-CM | POA: Diagnosis not present

## 2015-08-08 DIAGNOSIS — K529 Noninfective gastroenteritis and colitis, unspecified: Secondary | ICD-10-CM | POA: Diagnosis not present

## 2015-08-08 DIAGNOSIS — E860 Dehydration: Secondary | ICD-10-CM | POA: Diagnosis not present

## 2015-09-10 DIAGNOSIS — M65311 Trigger thumb, right thumb: Secondary | ICD-10-CM | POA: Diagnosis not present

## 2015-09-13 DIAGNOSIS — E785 Hyperlipidemia, unspecified: Secondary | ICD-10-CM | POA: Diagnosis not present

## 2015-09-13 DIAGNOSIS — E538 Deficiency of other specified B group vitamins: Secondary | ICD-10-CM | POA: Diagnosis not present

## 2015-09-17 DIAGNOSIS — E538 Deficiency of other specified B group vitamins: Secondary | ICD-10-CM | POA: Diagnosis not present

## 2015-10-01 DIAGNOSIS — H04123 Dry eye syndrome of bilateral lacrimal glands: Secondary | ICD-10-CM | POA: Diagnosis not present

## 2015-10-02 DIAGNOSIS — H40003 Preglaucoma, unspecified, bilateral: Secondary | ICD-10-CM | POA: Diagnosis not present

## 2015-10-08 DIAGNOSIS — M65311 Trigger thumb, right thumb: Secondary | ICD-10-CM | POA: Diagnosis not present

## 2015-10-11 DIAGNOSIS — M79674 Pain in right toe(s): Secondary | ICD-10-CM | POA: Diagnosis not present

## 2015-10-11 DIAGNOSIS — M79675 Pain in left toe(s): Secondary | ICD-10-CM | POA: Diagnosis not present

## 2015-10-11 DIAGNOSIS — B351 Tinea unguium: Secondary | ICD-10-CM | POA: Diagnosis not present

## 2015-10-18 DIAGNOSIS — E538 Deficiency of other specified B group vitamins: Secondary | ICD-10-CM | POA: Diagnosis not present

## 2015-11-18 DIAGNOSIS — E538 Deficiency of other specified B group vitamins: Secondary | ICD-10-CM | POA: Diagnosis not present

## 2015-12-19 DIAGNOSIS — E538 Deficiency of other specified B group vitamins: Secondary | ICD-10-CM | POA: Diagnosis not present

## 2016-01-20 DIAGNOSIS — E538 Deficiency of other specified B group vitamins: Secondary | ICD-10-CM | POA: Diagnosis not present

## 2016-02-11 DIAGNOSIS — M79674 Pain in right toe(s): Secondary | ICD-10-CM | POA: Diagnosis not present

## 2016-02-11 DIAGNOSIS — B351 Tinea unguium: Secondary | ICD-10-CM | POA: Diagnosis not present

## 2016-02-11 DIAGNOSIS — M79675 Pain in left toe(s): Secondary | ICD-10-CM | POA: Diagnosis not present

## 2016-02-20 DIAGNOSIS — E538 Deficiency of other specified B group vitamins: Secondary | ICD-10-CM | POA: Diagnosis not present

## 2016-03-02 ENCOUNTER — Ambulatory Visit: Payer: Medicare Other

## 2016-03-02 ENCOUNTER — Ambulatory Visit
Admission: EM | Admit: 2016-03-02 | Discharge: 2016-03-02 | Disposition: A | Discharge status: Home / Self Care | Payer: Medicare Other | Attending: Family Medicine | Admitting: Family Medicine

## 2016-03-02 DIAGNOSIS — W010XXA Fall on same level from slipping, tripping and stumbling without subsequent striking against object, initial encounter: Secondary | ICD-10-CM | POA: Diagnosis not present

## 2016-03-02 DIAGNOSIS — M79641 Pain in right hand: Secondary | ICD-10-CM | POA: Diagnosis not present

## 2016-03-02 DIAGNOSIS — S2241XA Multiple fractures of ribs, right side, initial encounter for closed fracture: Secondary | ICD-10-CM | POA: Insufficient documentation

## 2016-03-02 DIAGNOSIS — S60221A Contusion of right hand, initial encounter: Secondary | ICD-10-CM | POA: Diagnosis not present

## 2016-03-02 DIAGNOSIS — R0781 Pleurodynia: Secondary | ICD-10-CM | POA: Diagnosis not present

## 2016-03-02 DIAGNOSIS — S20211A Contusion of right front wall of thorax, initial encounter: Secondary | ICD-10-CM

## 2016-03-02 DIAGNOSIS — S62304A Unspecified fracture of fourth metacarpal bone, right hand, initial encounter for closed fracture: Secondary | ICD-10-CM | POA: Diagnosis not present

## 2016-03-02 NOTE — Discharge Instructions (Signed)
Keep in splint. Apply ice. Elevate. Take deep breaths multiple times per day.   Follow up with orthopedic this week as discussed. See above to call to schedule.   Follow up with your primary care physician this week as needed. Return to Urgent care for new or worsening concerns.

## 2016-03-02 NOTE — ED Provider Notes (Signed)
[facility] ____________________________________________  Time seen: Approximately 5:39 PM  I have reviewed the triage vital signs and the nursing notes.   HISTORY  Chief Complaint Hand Pain (Right Hand)   HPI Tiffany Mcbride is a 75 y.o. female presents for evaluation of right hand pain. Patient reports last Tuesday she stopped at the gas station to pump gas. Patient states she had started the gas pump and then was going back to her car. Patient states that she stepped over the gas pump cord and in the process of doing this she tripped over the cord causing her to fall forward. Patient states she caught herself primarily with her right hand but did hit her right side as well. Patient states pain overall has improved throughout the week but presents today as she has continued to have right hand pain.  Denies loss of consciousness or head injury. Denies neck or back pain or injury. Patient reports overall she feels well. Patient reports she is right-hand dominant. States she fell only because she tripped.   Patient states right hand pain is to right pinky and the side of her right hand. Patient states pain is mild to moderate and primarily with direct palpation. Denies any numbness or tingling sensation. Denies any pain radiation.  Patient does also report she has had some tenderness to her right ribs and expresses some history of rib fractures in the past. Patient states right rib pain is minimal and only with movement. Denies any rib pain with deep breaths. Denies any chest pain or shortness of breath. Denies any recent sickness.  Denies chest pain, shortness of breath, neck or back pain, other extremity pain or swelling, headache, vision changes, dizziness, weakness, dysuria or abdominal pain.   Sherlene Shams, MD: PCP  Past Medical History:  Diagnosis Date  . Arthritis   . GERD (gastroesophageal reflux disease)   . Glaucoma of both eyes   . History of gastric ulcer   .  Left ureteral calculus   . Urgency of urination     Patient Active Problem List   Diagnosis Date Noted  . B12 deficiency 11/28/2013  . Hip pain, chronic 03/06/2012  . Obesity (BMI 30-39.9) 03/06/2012  . Postmenopausal atrophic vaginitis 01/26/2012  . Screening for cervical cancer 01/26/2012  . Screening for colon cancer 01/26/2012  . Malaise 01/26/2012  . Glaucoma   . Osteoarthritis   . DYSLIPIDEMIA 04/11/2007  . GERD 04/11/2007  . DEPRESSION, HX OF 04/11/2007    Past Surgical History:  Procedure Laterality Date  . CERVICAL SPINE SURGERY  1992   posterior diskectomy  . CYSTOSCOPY WITH RETROGRADE PYELOGRAM, URETEROSCOPY AND STENT PLACEMENT Left 03/09/2013   Procedure: CYSTOSCOPY WITH RETROGRADE PYELOGRAM, URETEROSCOPY AND LEFT STENT PLACEMENT;  Surgeon: Sebastian Ache, MD;  Location: [facility];  Service: Urology;  Laterality: Left;  . ENDOMETRIAL ABLATION    . LAPAROSCOPIC CHOLECYSTECTOMY  1998  . PUBOVAGINAL SLING  1995  . TUBAL LIGATION     No current facility-administered medications for this encounter.   Current Outpatient Prescriptions:  .  celecoxib (CELEBREX) 200 MG capsule, Take 200 mg by mouth daily as needed for pain., Disp: , Rfl:  .  NEXIUM 40 MG capsule, TAKE ONE CAPSULE BY MOUTH ONCE A DAY BEFORE BREAKFAST, Disp: 30 capsule, Rfl: 0 .  timolol (BETIMOL) 0.25 % ophthalmic solution, Place 1-2 drops into both eyes every evening., Disp: , Rfl:  .  ciprofloxacin (CIPRO) 500 MG tablet, Take 1 tablet (500 mg  total) by mouth daily. x6 days to prevent post-op infection, Disp: 6 tablet, Rfl: 0 .  ketorolac (TORADOL) 10 MG tablet, Take 10 mg by mouth every 6 (six) hours as needed for pain., Disp: , Rfl:  .  promethazine (PHENERGAN) 12.5 MG tablet, Take 12.5 mg by mouth every 8 (eight) hours as needed for nausea., Disp: , Rfl:  .  senna-docusate (SENOKOT-S) 8.6-50 MG per tablet, Take 1 tablet by mouth 2 (two) times daily. While taking pain meds to prevent  contipation, Disp: 30 tablet, Rfl: 1 .  tamsulosin (FLOMAX) 0.4 MG CAPS capsule, Take by mouth., Disp: , Rfl:  .  traMADol (ULTRAM) 50 MG tablet, Take 1 tablet (50 mg total) by mouth every 6 (six) hours as needed for pain., Disp: 30 tablet, Rfl: 0  Allergies Codeine and Sulfa antibiotics  Family History  Problem Relation Age of Onset  . Hypertension Mother   . Heart disease Father   . Cancer Sister     metastatic liver Ca  . Heart disease Brother   . Cancer Brother     Lung    Social History Social History  Substance Use Topics  . Smoking status: Never Smoker  . Smokeless tobacco: Never Used  . Alcohol use No    Review of Systems Constitutional: No fever/chills Eyes: No visual changes. ENT: No sore throat. Cardiovascular: Denies chest pain. Respiratory: Denies shortness of breath. Gastrointestinal: No abdominal pain.  No nausea, no vomiting.  No diarrhea.  No constipation. Genitourinary: Negative for dysuria. Musculoskeletal: Negative for back pain. Skin: Negative for rash. Neurological: Negative for headaches, focal weakness or numbness.  10-point ROS otherwise negative.  ____________________________________________   PHYSICAL EXAM:  VITAL SIGNS: ED Triage Vitals [03/02/16 1525]  Enc Vitals Group     BP (!) 140/93     Pulse Rate 65     Resp 18     Temp 97.8 F (36.6 C)     Temp Source Oral     SpO2 98 %     Weight 180 lb (81.6 kg)     Height 5\' 2"  (1.575 m)     Head Circumference      Peak Flow      Pain Score 5     Pain Loc      Pain Edu?      Excl. in GC?     Constitutional: Alert and oriented. Well appearing and in no acute distress. Eyes: Conjunctivae are normal. PERRL. EOMI. ENT      Head: Normocephalic and atraumatic.      Nose: No congestion/rhinnorhea.      Mouth/Throat: Mucous membranes are moist.Oropharynx non-erythematous. Neck: No stridor. Supple without meningismus.  Hematological/Lymphatic/Immunilogical: No cervical  lymphadenopathy. Cardiovascular: Normal rate, regular rhythm. Grossly normal heart sounds.  Good peripheral circulation. Respiratory: Normal respiratory effort without tachypnea nor retractions. Breath sounds are clear and equal bilaterally. No wheezes/rales/rhonchi.. Gastrointestinal: Soft and nontender. No distention. Normal Bowel sounds. No CVA tenderness. Musculoskeletal:  Nontender with normal range of motion in all extremities. No midline cervical, thoracic or lumbar tenderness to palpation. Bilateral pedal pulses equal and easily palpated. Except : Right proximal fourth phalanx mild to moderate tenderness to palpation, right proximal fourth and fifth metacarpals mild to moderate tenderness to direct palpation with minimal swelling and mild ecchymosis. Right hand full range of motion, no motor or tendon deficit, normal capillary refill, bilateral hand grips strong and equal, bilateral distal radial pulses equal and easily palpated. Upper extremities otherwise nontender.  Except:  Right mid lateral to anterior point rib tenderness, no swelling, no ecchymosis, no crepitus, no palpable deformity, tenderness mild with direct palpation per patient. Torso otherwise nontender.       Right lower leg:  No tenderness or edema.      Left lower leg:  No tenderness or edema.  Neurologic:  Normal speech and language. No gross focal neurologic deficits are appreciated. Speech is normal. No gait instability.  Skin:  Skin is warm, dry and intact. No rash noted. Psychiatric: Mood and affect are normal. Speech and behavior are normal. Patient exhibits appropriate insight and judgment   ___________________________________________   LABS (all labs ordered are listed, but only abnormal results are displayed)  Labs Reviewed - No data to display _ RADIOLOGY EXAM: RIGHT RIBS AND CHEST - 3+ VIEW  COMPARISON:  Portable chest 01/16/2014.  Chest CT 10/25/2008.  FINDINGS: Stable lung volumes. Mediastinal  contours remain normal. Visualized tracheal air column is within normal limits. No pneumothorax, pulmonary edema, pleural effusion or confluent pulmonary opacity.  Rib marker placed at the right ninth rib level. There are chronic appearing right lateral eighth and ninth rib fractures. No superimposed acute displaced right rib fracture identified. Lower rib detail is degraded by body habitus. Other visualized osseous structures appear intact. Right upper quadrant probable cholecystectomy clips.  IMPRESSION: 1. Chronic right lateral eighth and ninth rib fractures. No acute displaced right rib fracture identified. 2.  No acute cardiopulmonary abnormality.   Electronically Signed   By: Odessa Fleming M.D.   On: 03/02/2016 17:29  EXAM: RIGHT HAND - COMPLETE 3+ VIEW  COMPARISON:  Right hand series 08/20/2008.  FINDINGS: Distal radius and ulna appear stable and intact. Stable carpal bone alignment. Chronic severe but progressed degenerative changes at the basal joint of the right thumb. There is some chronic osteophytosis also at the other metacarpal bases. Mild cortical irregularity seen between the base of the fourth and fifth metacarpals on image 1 (arrow) is new. Otherwise no metacarpal fracture identified. Distal joint space degeneration appears stable. Phalanges appear intact.  IMPRESSION: 1. Difficult to exclude acute nondisplaced fracture at the base of the fourth metacarpal, although the appearance is favored to reflect progression of chronic proximal metacarpal degeneration since 2010. 2. No other acute fracture or dislocation identified about the right hand.   Electronically Signed   By: Odessa Fleming M.D.   On: 03/02/2016 17:25  ____________________________________________   PROCEDURES Procedures   Right hand ulnar gutter splint applied by CMA, neurovascular intact post Application.  INITIAL IMPRESSION / ASSESSMENT AND PLAN / ED COURSE  Pertinent labs &  imaging results that were available during my care of the patient were reviewed by me and considered in my medical decision making (see chart for details).  Very well-appearing patient. No acute distress. Active and smiling. Patient presenting for complaints of right hand evaluation and right rib evaluation post mechanical fall 6 days ago. Denies any other pain or injury. Patient reports she fell only because she tripped while pumping her gas. Patient reports she is doing well otherwise. Denies any chest pain or shortness of breath. Patient states right lateral rib pain is present only second to direct fall and states pain is only present with movement, and was fully reproducible with direct palpation on exam. Will evaluate her right hand and right rib x-rays.  X-ray results reviewed. Per radiologist's chronic right lateral eighth and ninth rib fractures, no acute displaced right rib fractures identified, no acute cardiopulmonary abnormality. Per radiologist's right  hand x-ray difficult to exclude acute nondisplaced fracture at the base of fourth metacarpal although the appearance is favored to reflect progression of chronic degeneration, no other acute fracture or dislocation to fight about the right hand.  Discussed in detail with patient regarding x-ray results. As patient is point tender at proximal fourth metacarpal, concern for acute fracture. Discussed in detail regarding this patient. Will patient in splint for support and stability. Patient reports she has an orthopedics that she follows with in [place]; CD given and encouraged follow-up this week. Encouraged patient to continue to apply ice and rest right hand. Encourage deep breaths exercises multiple times throughout the day and monitoring rib tenderness. Patient denies any other complaints and well-appearing. Encourage outpatient follow-up. Patient declines the need for prescription medication.  Discussed follow up with Primary care  physician this week. Discussed follow up and return parameters including no resolution or any worsening concerns. Patient verbalized understanding and agreed to plan.   ____________________________________________   FINAL CLINICAL IMPRESSION(S) / ED DIAGNOSES  Final diagnoses:  Hand contusion, right, initial encounter  Rib contusion, right, initial encounter  Closed fracture of fourth metacarpal bone of right hand, initial encounter     Discharge Medication List as of 03/02/2016  5:44 PM      Note: This dictation was prepared with Dragon dictation along with smaller phrase technology. Any transcriptional errors that result from this process are unintentional.    Clinical Course      Renford Dills, NP 03/13/16 1519

## 2016-03-02 NOTE — ED Triage Notes (Signed)
Patient fell on level ground on Tuesday and she hit her right hand on the ground, and landed on her right side.

## 2016-03-05 DIAGNOSIS — M79641 Pain in right hand: Secondary | ICD-10-CM | POA: Diagnosis not present

## 2016-03-05 DIAGNOSIS — Z9181 History of falling: Secondary | ICD-10-CM | POA: Diagnosis not present

## 2016-03-17 DIAGNOSIS — M79641 Pain in right hand: Secondary | ICD-10-CM | POA: Diagnosis not present

## 2016-03-23 DIAGNOSIS — E538 Deficiency of other specified B group vitamins: Secondary | ICD-10-CM | POA: Diagnosis not present

## 2016-03-31 DIAGNOSIS — H40003 Preglaucoma, unspecified, bilateral: Secondary | ICD-10-CM | POA: Diagnosis not present

## 2016-04-07 DIAGNOSIS — M79641 Pain in right hand: Secondary | ICD-10-CM | POA: Diagnosis not present

## 2016-04-23 DIAGNOSIS — E538 Deficiency of other specified B group vitamins: Secondary | ICD-10-CM | POA: Diagnosis not present

## 2016-05-08 DIAGNOSIS — Z23 Encounter for immunization: Secondary | ICD-10-CM | POA: Diagnosis not present

## 2016-05-28 DIAGNOSIS — H40153 Residual stage of open-angle glaucoma, bilateral: Secondary | ICD-10-CM | POA: Diagnosis not present

## 2016-05-28 DIAGNOSIS — E538 Deficiency of other specified B group vitamins: Secondary | ICD-10-CM | POA: Diagnosis not present

## 2016-06-03 DIAGNOSIS — B351 Tinea unguium: Secondary | ICD-10-CM | POA: Diagnosis not present

## 2016-06-03 DIAGNOSIS — M79675 Pain in left toe(s): Secondary | ICD-10-CM | POA: Diagnosis not present

## 2016-06-03 DIAGNOSIS — M79674 Pain in right toe(s): Secondary | ICD-10-CM | POA: Diagnosis not present

## 2016-06-29 DIAGNOSIS — E538 Deficiency of other specified B group vitamins: Secondary | ICD-10-CM | POA: Diagnosis not present

## 2016-07-03 HISTORY — PX: BACK SURGERY: SHX140

## 2016-07-15 ENCOUNTER — Emergency Department: Payer: Medicare Other

## 2016-07-15 ENCOUNTER — Emergency Department
Admission: EM | Admit: 2016-07-15 | Discharge: 2016-07-15 | Disposition: A | Discharge status: Home / Self Care | Payer: Medicare Other | Attending: Emergency Medicine | Admitting: Emergency Medicine

## 2016-07-15 ENCOUNTER — Encounter: Payer: Self-pay | Admitting: Intensive Care

## 2016-07-15 DIAGNOSIS — Y999 Unspecified external cause status: Secondary | ICD-10-CM | POA: Insufficient documentation

## 2016-07-15 DIAGNOSIS — M549 Dorsalgia, unspecified: Secondary | ICD-10-CM | POA: Diagnosis not present

## 2016-07-15 DIAGNOSIS — S22080A Wedge compression fracture of T11-T12 vertebra, initial encounter for closed fracture: Secondary | ICD-10-CM | POA: Insufficient documentation

## 2016-07-15 DIAGNOSIS — Y939 Activity, unspecified: Secondary | ICD-10-CM | POA: Insufficient documentation

## 2016-07-15 DIAGNOSIS — Y929 Unspecified place or not applicable: Secondary | ICD-10-CM | POA: Insufficient documentation

## 2016-07-15 DIAGNOSIS — S22000A Wedge compression fracture of unspecified thoracic vertebra, initial encounter for closed fracture: Secondary | ICD-10-CM

## 2016-07-15 DIAGNOSIS — S3991XA Unspecified injury of abdomen, initial encounter: Secondary | ICD-10-CM | POA: Diagnosis not present

## 2016-07-15 DIAGNOSIS — K5900 Constipation, unspecified: Secondary | ICD-10-CM | POA: Insufficient documentation

## 2016-07-15 DIAGNOSIS — W19XXXA Unspecified fall, initial encounter: Secondary | ICD-10-CM | POA: Insufficient documentation

## 2016-07-15 DIAGNOSIS — M545 Low back pain: Secondary | ICD-10-CM | POA: Diagnosis not present

## 2016-07-15 DIAGNOSIS — S299XXA Unspecified injury of thorax, initial encounter: Secondary | ICD-10-CM | POA: Diagnosis present

## 2016-07-15 DIAGNOSIS — S3992XA Unspecified injury of lower back, initial encounter: Secondary | ICD-10-CM | POA: Diagnosis not present

## 2016-07-15 DIAGNOSIS — K5909 Other constipation: Secondary | ICD-10-CM | POA: Diagnosis not present

## 2016-07-15 LAB — CBC WITH DIFFERENTIAL/PLATELET
Basophils Absolute: 0.1 10*3/uL (ref 0–0.1)
Basophils Relative: 1 %
EOS ABS: 0.2 10*3/uL (ref 0–0.7)
Eosinophils Relative: 2 %
HEMATOCRIT: 41.2 % (ref 35.0–47.0)
HEMOGLOBIN: 13.7 g/dL (ref 12.0–16.0)
Lymphocytes Relative: 29 %
Lymphs Abs: 2.9 10*3/uL (ref 1.0–3.6)
MCH: 31.3 pg (ref 26.0–34.0)
MCHC: 33.3 g/dL (ref 32.0–36.0)
MCV: 93.9 fL (ref 80.0–100.0)
MONOS PCT: 8 %
Monocytes Absolute: 0.8 10*3/uL (ref 0.2–0.9)
NEUTROS ABS: 6.1 10*3/uL (ref 1.4–6.5)
NEUTROS PCT: 60 %
Platelets: 282 10*3/uL (ref 150–440)
RBC: 4.39 MIL/uL (ref 3.80–5.20)
RDW: 12.9 % (ref 11.5–14.5)
WBC: 10.1 10*3/uL (ref 3.6–11.0)

## 2016-07-15 LAB — COMPREHENSIVE METABOLIC PANEL
ALBUMIN: 4.1 g/dL (ref 3.5–5.0)
ALT: 26 U/L (ref 14–54)
ANION GAP: 8 (ref 5–15)
AST: 31 U/L (ref 15–41)
Alkaline Phosphatase: 92 U/L (ref 38–126)
BILIRUBIN TOTAL: 0.4 mg/dL (ref 0.3–1.2)
BUN: 15 mg/dL (ref 6–20)
CO2: 23 mmol/L (ref 22–32)
Calcium: 9.2 mg/dL (ref 8.9–10.3)
Chloride: 105 mmol/L (ref 101–111)
Creatinine, Ser: 0.9 mg/dL (ref 0.44–1.00)
GFR calc non Af Amer: >60 mL/min (ref >60)
GLUCOSE: 98 mg/dL (ref 65–99)
POTASSIUM: 3.9 mmol/L (ref 3.5–5.1)
SODIUM: 136 mmol/L (ref 135–145)
TOTAL PROTEIN: 7.6 g/dL (ref 6.5–8.1)

## 2016-07-15 LAB — LIPASE, BLOOD: Lipase: 23 U/L (ref 11–51)

## 2016-07-15 MED ORDER — MAGNESIUM CITRATE PO SOLN
1.0000 | Freq: Once | ORAL | Status: AC
  Administered 2016-07-15: 1 via ORAL
  Filled 2016-07-15: qty 296

## 2016-07-15 MED ORDER — IOPAMIDOL (ISOVUE-300) INJECTION 61%
30.0000 mL | Freq: Once | INTRAVENOUS | Status: AC | PRN
  Administered 2016-07-15: 30 mL via ORAL

## 2016-07-15 MED ORDER — IOPAMIDOL (ISOVUE-300) INJECTION 61%
100.0000 mL | Freq: Once | INTRAVENOUS | Status: AC | PRN
  Administered 2016-07-15: 100 mL via INTRAVENOUS

## 2016-07-15 MED ORDER — HYDROCODONE-ACETAMINOPHEN 5-325 MG PO TABS: 2.0000 | ORAL_TABLET | Freq: Four times a day (QID) | ORAL | 0 refills | Status: DC | PRN

## 2016-07-15 NOTE — ED Provider Notes (Signed)
[facility] Emergency Department Provider Note   ____________________________________________   First MD Initiated Contact with Patient 07/15/16 1422     (approximate)  I have reviewed the triage vital signs and the nursing notes.   HISTORY  Chief Complaint Constipation and Back Pain    HPI Tiffany Mcbride is a 75 y.o. female reports she fell 6 days ago. She has not had a stool since then. She denies any weakness in her legs. She denies any numbness in her perineum. She does have pain in her low back however. She usually stools every day. She denies any other injuries did not pass out but did not hit her head. Pain is moderate in her back and now in her belly as well. It is achy in nature.   Past Medical History:  Diagnosis Date  . Arthritis   . GERD (gastroesophageal reflux disease)   . Glaucoma of both eyes   . History of gastric ulcer   . Left ureteral calculus   . Urgency of urination     Patient Active Problem List   Diagnosis Date Noted  . B12 deficiency 11/28/2013  . Hip pain, chronic 03/06/2012  . Obesity (BMI 30-39.9) 03/06/2012  . Postmenopausal atrophic vaginitis 01/26/2012  . Screening for cervical cancer 01/26/2012  . Screening for colon cancer 01/26/2012  . Malaise 01/26/2012  . Glaucoma   . Osteoarthritis   . DYSLIPIDEMIA 04/11/2007  . GERD 04/11/2007  . DEPRESSION, HX OF 04/11/2007    Past Surgical History:  Procedure Laterality Date  . CERVICAL SPINE SURGERY  1992   posterior diskectomy  . CYSTOSCOPY WITH RETROGRADE PYELOGRAM, URETEROSCOPY AND STENT PLACEMENT Left 03/09/2013   Procedure: CYSTOSCOPY WITH RETROGRADE PYELOGRAM, URETEROSCOPY AND LEFT STENT PLACEMENT;  Surgeon: Sebastian Ache, MD;  Location: [facility];  Service: Urology;  Laterality: Left;  . ENDOMETRIAL ABLATION    . LAPAROSCOPIC CHOLECYSTECTOMY  1998  . PUBOVAGINAL SLING  1995  . TUBAL LIGATION      Prior to Admission medications     Medication Sig Start Date End Date Taking? Authorizing Provider  CALCIUM CARBONATE-VIT D-MIN PO Take 1 tablet by mouth daily.   Yes Historical Provider, MD  cyanocobalamin (,VITAMIN B-12,) 1000 MCG/ML injection Inject 1,000 mcg into the muscle every 30 (thirty) days. 02/20/16 02/14/17 Yes Historical Provider, MD  Cyanocobalamin (VITAMIN B 12 PO) Take 1 tablet by mouth daily.   Yes Historical Provider, MD  NEXIUM 40 MG capsule TAKE ONE CAPSULE BY MOUTH ONCE A DAY BEFORE BREAKFAST 05/19/13  Yes Sherlene Shams, MD  ciprofloxacin (CIPRO) 500 MG tablet Take 1 tablet (500 mg total) by mouth daily. x6 days to prevent post-op infection Patient not taking: Reported on 07/15/2016 03/09/13   Sebastian Ache, MD  HYDROcodone-acetaminophen (NORCO/VICODIN) 5-325 MG tablet Take 2 tablets by mouth every 6 (six) hours as needed for moderate pain. Take 1 or 2 tablets every 6 hours as needed for pain 07/15/16 07/15/17  Arnaldo Natal, MD  senna-docusate (SENOKOT-S) 8.6-50 MG per tablet Take 1 tablet by mouth 2 (two) times daily. While taking pain meds to prevent contipation 03/09/13   Sebastian Ache, MD    Allergies Codeine and Sulfa antibiotics  Family History  Problem Relation Age of Onset  . Hypertension Mother   . Heart disease Father   . Cancer Sister     metastatic liver Ca  . Heart disease Brother   . Cancer Brother     Lung  Social History Social History  Substance Use Topics  . Smoking status: Never Smoker  . Smokeless tobacco: Never Used  . Alcohol use No    Review of Systems Constitutional: No fever/chills Eyes: No visual changes. ENT: No sore throat. Cardiovascular: Denies chest pain. Respiratory: Denies shortness of breath. Gastrointestinal: See history of present illness Genitourinary: Negative for dysuria. Musculoskeletal: See history of present illness Skin: Negative for rash. Neurological: Negative for headaches, focal weakness or numbness.  10-point ROS otherwise  negative.  ____________________________________________   PHYSICAL EXAM:  VITAL SIGNS: ED Triage Vitals  Enc Vitals Group     BP 07/15/16 1405 (!) 165/79     Pulse Rate 07/15/16 1405 71     Resp 07/15/16 1405 18     Temp 07/15/16 1405 98.1 F (36.7 C)     Temp Source 07/15/16 1405 Oral     SpO2 07/15/16 1405 97 %     Weight 07/15/16 1404 185 lb (83.9 kg)     Height 07/15/16 1404 5\' 3"  (1.6 m)     Head Circumference --      Peak Flow --      Pain Score 07/15/16 1407 10     Pain Loc --      Pain Edu? --      Excl. in GC? --     Constitutional: Alert and oriented. Well appearing and in no acute distress. Eyes: Conjunctivae are normal. PERRL. EOMI. Head: Atraumatic. Nose: No congestion/rhinnorhea. Mouth/Throat: Mucous membranes are moist.  Oropharynx non-erythematous. Neck: No stridor Cardiovascular: Normal rate, regular rhythm. Grossly normal heart sounds.  Good peripheral circulation. Respiratory: Normal respiratory effort.  No retractions. Lungs CTAB. Gastrointestinal: Soft and Mildly diffusely tender  distention. No abdominal bruits. No CVA tenderness. Rectal: Good tone Musculoskeletal: No lower extremity tenderness nor edema.  No joint effusions. Neurologic:  Normal speech and language. No gross focal neurologic deficits are appreciated. No gait instability. Skin:  Skin is warm, dry and intact. No rash noted.   ____________________________________________   LABS (all labs ordered are listed, but only abnormal results are displayed)  Labs Reviewed  COMPREHENSIVE METABOLIC PANEL  LIPASE, BLOOD  CBC WITH DIFFERENTIAL/PLATELET   ____________________________________________  EKG   ____________________________________________  RADIOLOGY Study Result   CLINICAL DATA:  Fell 1 week ago.  Persistent back pain.  EXAM: CT LUMBAR SPINE WITHOUT CONTRAST  TECHNIQUE: Multidetector CT imaging of the lumbar spine was performed without intravenous contrast  administration. Multiplanar CT image reconstructions were also generated.  COMPARISON:  Lumbar radiographs 11/02/2013  FINDINGS: Segmentation: 5 lumbar type non-rib-bearing vertebral bodies.  Alignment: Normal  Vertebrae: Mild compression fracture of the T11 vertebral body is noted. Associated paraspinal hematoma. No retropulsion or canal compromise. T12 is intact in the lumbar vertebral bodies are intact. Moderate degenerative changes with multilevel disc disease and facet disease. No pars defects or fractures.  Paraspinal and other soft tissues: Paraspinal hematoma centered around the T11 vertebral body.  Disc levels: No significant disc protrusions, spinal or foraminal stenosis.  IMPRESSION: Mild acute compression fracture of T11 with paraspinal hematoma. No retropulsion or canal compromise.  Moderate to advanced degenerative disc disease and degenerative facet disease involving the lumbar spine but no acute lumbar compression fracture.   Electronically Signed   By: Rudie MeyerP.  Gallerani M.D.   On: 07/15/2016 16:49      ____________________________________________   PROCEDURES  Procedure(s) performed:   Procedures  Critical Care performed:   ____________________________________________   INITIAL IMPRESSION / ASSESSMENT AND PLAN / ED  COURSE  Pertinent labs & imaging results that were available during my care of the patient were reviewed by me and considered in my medical decision making (see chart for details).    Clinical Course    Patient is able to walk without any difficulty. Offered an enema but would rather try the magnesium citrate.  ____________________________________________   FINAL CLINICAL IMPRESSION(S) / ED DIAGNOSES  Final diagnoses:  Compression fracture of body of thoracic vertebra (HCC)  Constipation, unspecified constipation type      NEW MEDICATIONS STARTED DURING THIS VISIT:  New Prescriptions    HYDROCODONE-ACETAMINOPHEN (NORCO/VICODIN) 5-325 MG TABLET    Take 2 tablets by mouth every 6 (six) hours as needed for moderate pain. Take 1 or 2 tablets every 6 hours as needed for pain     Note:  This document was prepared using Dragon voice recognition software and may include unintentional dictation errors.    Arnaldo NatalPaul F Waleed Dettman, MD 07/15/16 1726

## 2016-07-15 NOTE — Discharge Instructions (Signed)
Please take the magnesium citrate. It should give you a good bowel movementtoday. You can use the Vicodin or hydrocodone 1 or 2 pills up to 4 times a day if needed. Be carefully can make you groggy downfall again. Be careful cause it can also make you more constipated. Please follow-up with your orthopedic doctor or [facility] on call here. They may recommend kyphoplasty for you. This is a procedure which may help with the pain.

## 2016-07-15 NOTE — ED Triage Notes (Signed)
Patient arrived by EMS from home. Patient reports falling last Thursday and ever since has had back pain and constipation. Pt was not seen/treated for fall. Denies LOC when falling. Denies blood thinners. Pt has been ambulatory since fall and ambulatory upon arrival to ER. A&O x4

## 2016-07-15 NOTE — ED Notes (Signed)
Gave pt blanket

## 2016-07-16 DIAGNOSIS — S22000A Wedge compression fracture of unspecified thoracic vertebra, initial encounter for closed fracture: Secondary | ICD-10-CM | POA: Insufficient documentation

## 2016-07-16 DIAGNOSIS — M4854XA Collapsed vertebra, not elsewhere classified, thoracic region, initial encounter for fracture: Secondary | ICD-10-CM | POA: Diagnosis not present

## 2016-07-20 DIAGNOSIS — S22080A Wedge compression fracture of T11-T12 vertebra, initial encounter for closed fracture: Secondary | ICD-10-CM | POA: Diagnosis not present

## 2016-07-21 ENCOUNTER — Encounter
Admission: RE | Admit: 2016-07-21 | Discharge: 2016-07-21 | Disposition: A | Discharge status: Home / Self Care | Payer: Medicare Other | Source: Ambulatory Visit | Attending: Orthopedic Surgery | Admitting: Orthopedic Surgery

## 2016-07-21 DIAGNOSIS — E785 Hyperlipidemia, unspecified: Secondary | ICD-10-CM | POA: Diagnosis not present

## 2016-07-21 DIAGNOSIS — M1990 Unspecified osteoarthritis, unspecified site: Secondary | ICD-10-CM | POA: Diagnosis not present

## 2016-07-21 DIAGNOSIS — K219 Gastro-esophageal reflux disease without esophagitis: Secondary | ICD-10-CM | POA: Diagnosis not present

## 2016-07-21 DIAGNOSIS — Z01810 Encounter for preprocedural cardiovascular examination: Secondary | ICD-10-CM | POA: Diagnosis not present

## 2016-07-21 DIAGNOSIS — M4854XA Collapsed vertebra, not elsewhere classified, thoracic region, initial encounter for fracture: Secondary | ICD-10-CM | POA: Diagnosis not present

## 2016-07-21 HISTORY — DX: Personal history of urinary calculi: Z87.442

## 2016-07-21 HISTORY — DX: History of falling: Z91.81

## 2016-07-21 NOTE — Patient Instructions (Signed)
  Your procedure is scheduled on:07/23/16 Report to Day Surgery. [facility]OND FLOOR To find out your arrival time please call [phone] between 1PM - 3PM on 07/22/16  Remember: Instructions that are not followed completely may result in serious medical risk, up to and including death, or upon the discretion of your surgeon and anesthesiologist your surgery may need to be rescheduled.    __X__ 1. Do not eat food or drink liquids after midnight. No gum chewing or hard candies.     ____ 2. No Alcohol for 24 hours before or after surgery.   ____ 3. Do Not Smoke For 24 Hours Prior to Your Surgery.   ____ 4. Bring all medications with you on the day of surgery if instructed.    __X__ 5. Notify your doctor if there is any change in your medical condition     (cold, fever, infections).       Do not wear jewelry, make-up, hairpins, clips or nail polish.  Do not wear lotions, powders, or perfumes. You may wear deodorant.  Do not shave 48 hours prior to surgery. Men may shave face and neck.  Do not bring valuables to the hospital.    [facility] is not responsible for any belongings or valuables.               Contacts, dentures or bridgework may not be worn into surgery.  Leave your suitcase in the car. After surgery it may be brought to your room.  For patients admitted to the hospital, discharge time is determined by your                treatment team.   Patients discharged the day of surgery will not be allowed to drive home.      __X__ Take these medicines the morning of surgery with A SIP OF WATER:    1.NEXIUM AT BEDTIME 07/22/16 AND AM OF SURGERY  2.   3.   4.  5.  6.  ____ Fleet Enema (as directed)   _X__ Use CHG Soap as directed  ____ Use inhalers on the day of surgery  ____ Stop metformin 2 days prior to surgery    ____ Take 1/2 of usual insulin dose the night before surgery and none on the morning of surgery.   ____ Stop Coumadin/Plavix/aspirin on ____  Stop Anti-inflammatories on    __X__ Stop supplements until after surgery.     STOP FLAXSEED AND LINSEED OIL ____ Bring C-Pap to the hospital.

## 2016-07-22 NOTE — Pre-Procedure Instructions (Signed)
EKG sent to Anesthesia for review. 

## 2016-07-23 ENCOUNTER — Ambulatory Visit: Payer: Medicare Other

## 2016-07-23 ENCOUNTER — Ambulatory Visit: Payer: Medicare Other | Admitting: Anesthesiology

## 2016-07-23 ENCOUNTER — Ambulatory Visit
Admission: RE | Admit: 2016-07-23 | Discharge: 2016-07-23 | Disposition: A | Discharge status: Home / Self Care | Payer: Medicare Other | Source: Ambulatory Visit | Attending: Orthopedic Surgery | Admitting: Orthopedic Surgery

## 2016-07-23 ENCOUNTER — Encounter: Payer: Self-pay | Admitting: *Deleted

## 2016-07-23 ENCOUNTER — Encounter
Admission: RE | Disposition: A | Discharge status: Home / Self Care | Payer: Self-pay | Source: Ambulatory Visit | Attending: Orthopedic Surgery

## 2016-07-23 DIAGNOSIS — M4854XA Collapsed vertebra, not elsewhere classified, thoracic region, initial encounter for fracture: Secondary | ICD-10-CM | POA: Diagnosis not present

## 2016-07-23 DIAGNOSIS — S22080A Wedge compression fracture of T11-T12 vertebra, initial encounter for closed fracture: Secondary | ICD-10-CM | POA: Diagnosis not present

## 2016-07-23 DIAGNOSIS — K219 Gastro-esophageal reflux disease without esophagitis: Secondary | ICD-10-CM | POA: Insufficient documentation

## 2016-07-23 DIAGNOSIS — Z981 Arthrodesis status: Secondary | ICD-10-CM | POA: Diagnosis not present

## 2016-07-23 DIAGNOSIS — M1990 Unspecified osteoarthritis, unspecified site: Secondary | ICD-10-CM | POA: Insufficient documentation

## 2016-07-23 DIAGNOSIS — E785 Hyperlipidemia, unspecified: Secondary | ICD-10-CM | POA: Diagnosis not present

## 2016-07-23 DIAGNOSIS — Z419 Encounter for procedure for purposes other than remedying health state, unspecified: Secondary | ICD-10-CM

## 2016-07-23 HISTORY — PX: KYPHOPLASTY: SHX5884

## 2016-07-23 HISTORY — DX: Hyperlipidemia, unspecified: E78.5

## 2016-07-23 HISTORY — DX: Cardiac murmur, unspecified: R01.1

## 2016-07-23 SURGERY — KYPHOPLASTY
Anesthesia: General | Wound class: Clean

## 2016-07-23 MED ORDER — PROPOFOL 10 MG/ML IV BOLUS
INTRAVENOUS | Status: AC
  Filled 2016-07-23: qty 20

## 2016-07-23 MED ORDER — FENTANYL CITRATE (PF) 100 MCG/2ML IJ SOLN
INTRAMUSCULAR | Status: AC
  Filled 2016-07-23: qty 2

## 2016-07-23 MED ORDER — CEFAZOLIN SODIUM-DEXTROSE 2-4 GM/100ML-% IV SOLN
INTRAVENOUS | Status: AC
  Filled 2016-07-23: qty 100

## 2016-07-23 MED ORDER — FENTANYL CITRATE (PF) 100 MCG/2ML IJ SOLN
25.0000 ug | INTRAMUSCULAR | Status: DC | PRN
  Administered 2016-07-23 (×2): 25 ug via INTRAVENOUS

## 2016-07-23 MED ORDER — BUPIVACAINE HCL (PF) 0.5 % IJ SOLN
INTRAMUSCULAR | Status: AC
  Filled 2016-07-23: qty 30

## 2016-07-23 MED ORDER — PROPOFOL 500 MG/50ML IV EMUL
INTRAVENOUS | Status: DC | PRN
  Administered 2016-07-23: 50 ug/kg/min via INTRAVENOUS

## 2016-07-23 MED ORDER — IOPAMIDOL (ISOVUE-M 200) INJECTION 41%
INTRAMUSCULAR | Status: AC
  Filled 2016-07-23: qty 20

## 2016-07-23 MED ORDER — EPINEPHRINE PF 1 MG/ML IJ SOLN
INTRAMUSCULAR | Status: AC
  Filled 2016-07-23: qty 1

## 2016-07-23 MED ORDER — CEFAZOLIN SODIUM-DEXTROSE 2-4 GM/100ML-% IV SOLN
2.0000 g | Freq: Once | INTRAVENOUS | Status: AC
  Administered 2016-07-23: 2 g via INTRAVENOUS

## 2016-07-23 MED ORDER — HYDROCODONE-ACETAMINOPHEN 5-325 MG PO TABS: 1.0000 | ORAL_TABLET | Freq: Four times a day (QID) | ORAL | 0 refills | Status: DC | PRN

## 2016-07-23 MED ORDER — LIDOCAINE HCL 1 % IJ SOLN
INTRAMUSCULAR | Status: DC | PRN
  Administered 2016-07-23: 20 mL

## 2016-07-23 MED ORDER — MIDAZOLAM HCL 2 MG/2ML IJ SOLN
INTRAMUSCULAR | Status: DC | PRN
  Administered 2016-07-23: 2 mg via INTRAVENOUS

## 2016-07-23 MED ORDER — LACTATED RINGERS IV SOLN
INTRAVENOUS | Status: DC
  Administered 2016-07-23: 12:00:00 via INTRAVENOUS

## 2016-07-23 MED ORDER — SODIUM CHLORIDE 0.9 % IV SOLN
INTRAVENOUS | Status: DC | PRN
  Administered 2016-07-23: 13:00:00 via INTRAVENOUS

## 2016-07-23 MED ORDER — BUPIVACAINE-EPINEPHRINE (PF) 0.5% -1:200000 IJ SOLN
INTRAMUSCULAR | Status: DC | PRN
  Administered 2016-07-23: 10 mL

## 2016-07-23 MED ORDER — LIDOCAINE HCL (PF) 1 % IJ SOLN
INTRAMUSCULAR | Status: AC
  Filled 2016-07-23: qty 60

## 2016-07-23 MED ORDER — MIDAZOLAM HCL 2 MG/2ML IJ SOLN
INTRAMUSCULAR | Status: AC
  Filled 2016-07-23: qty 2

## 2016-07-23 MED ORDER — FENTANYL CITRATE (PF) 100 MCG/2ML IJ SOLN
INTRAMUSCULAR | Status: DC | PRN
Start: 2016-07-23 — End: 2016-07-23
  Administered 2016-07-23 (×2): 50 ug via INTRAVENOUS

## 2016-07-23 SURGICAL SUPPLY — 15 items
CEMENT KYPHON CX01A KIT/MIXER (Cement) ×3 IMPLANT
DEVICE BIOPSY BONE KYPHX (INSTRUMENTS) ×3 IMPLANT
DRAPE C-ARM XRAY 36X54 (DRAPES) ×3 IMPLANT
DURAPREP 26ML APPLICATOR (WOUND CARE) ×3 IMPLANT
GLOVE SURG SYN 9.0  PF PI (GLOVE) ×2
GLOVE SURG SYN 9.0 PF PI (GLOVE) ×1 IMPLANT
GOWN SRG 2XL LVL 4 RGLN SLV (GOWNS) ×1 IMPLANT
GOWN STRL NON-REIN 2XL LVL4 (GOWNS) ×3
GOWN STRL REUS W/ TWL LRG LVL3 (GOWN DISPOSABLE) ×1 IMPLANT
GOWN STRL REUS W/TWL LRG LVL3 (GOWN DISPOSABLE) ×3
LIQUID BAND (GAUZE/BANDAGES/DRESSINGS) ×3 IMPLANT
PACK KYPHOPLASTY (MISCELLANEOUS) ×3 IMPLANT
STRAP SAFETY BODY (MISCELLANEOUS) ×3 IMPLANT
TRAY KYPHOPAK 15/3 EXPRESS 1ST (MISCELLANEOUS) ×3 IMPLANT
TRAY KYPHOPAK 20/3 EXPRESS 1ST (MISCELLANEOUS) ×1 IMPLANT

## 2016-07-23 NOTE — Op Note (Signed)
07/23/2016  2:13 PM  PATIENT:  Tiffany BameLinda E Mcbride  75 y.o. female  PRE-OPERATIVE DIAGNOSIS:  CLOSED WEDGE COMPRESSION T11  POST-OPERATIVE DIAGNOSIS:  closed wedge compression T11  PROCEDURE:  Procedure(s): KYPHOPLASTY T11 (N/A)  SURGEON: Leitha SchullerMichael J Deshunda Thackston, MD  ASSISTANTS: None  ANESTHESIA:   local and MAC  EBL:  No intake/output data recorded.  BLOOD ADMINISTERED:none  DRAINS: none   LOCAL MEDICATIONS USED:  MARCAINE    and XYLOCAINE   SPECIMEN:  Source of Specimen:  T11 vertebral body  DISPOSITION OF SPECIMEN:  PATHOLOGY  COUNTS:  YES  TOURNIQUET:  * No tourniquets in log *  IMPLANTS: Bone cement  DICTATION: .Dragon Dictation patient brought the operating room and after adequate anesthesia was obtained with sedation, patient was placed prone. C-arm was brought in and good visualization of the compressed T11 vertebral body was identified. After patient identification timeout procedure local anesthetic was infiltrated subcutaneously after prepping the skin with alcohol the back was then prepped and draped in sterile fashion and repeat timeout procedure carried out. Spinal needle was then used on the right side to get down to the pedicle and local anesthetic a 50-50 mix of half percent Sensorcaine 1% Xylocaine with epinephrine infiltrated along the tract. After allowing this to set small incision was made and trocar advanced the same path. The vertebral body was entered staying extrapedicular. Biopsy was obtained drilling carried out and then balloon inserted and inflated to 3 cc. It was in the midline. After the cement was the appropriate consistency the vertebral bodies filled with about 3 and half cc of bone cement with good fill. There was a small amount extravasation posterior lateral, but did not appear to the canal on oblique views. After the cemented set the trochars removed and permanent C-arm views obtained. A Band-Aid was applied after closing the skin with Dermabond.  PLAN  OF CARE: Discharge to home after PACU  PATIENT DISPOSITION:  PACU - hemodynamically stable.

## 2016-07-23 NOTE — Discharge Instructions (Addendum)
Take it easy today with regard to activities. Resume normal activities tomorrow as tolerated. Remove Band-Aid on Saturday and then okay to shower. Call if pain not relieved. Pain medicine as directed if needed    AMBULATORY SURGERY  DISCHARGE INSTRUCTIONS   1) The drugs that you were given will stay in your system until tomorrow so for the next 24 hours you should not:  A) Drive an automobile B) Make any legal decisions C) Drink any alcoholic beverage   2) You may resume regular meals tomorrow.  Today it is better to start with liquids and gradually work up to solid foods.  You may eat anything you prefer, but it is better to start with liquids, then soup and crackers, and gradually work up to solid foods.   3) Please notify your doctor immediately if you have any unusual bleeding, trouble breathing, redness and pain at the surgery site, drainage, fever, or pain not relieved by medication.    4) Additional Instructions:        Please contact your physician with any problems or Same Day Surgery at [phone], Monday through Friday 6 am to 4 pm, or  at [facility] Main number at [phone].

## 2016-07-23 NOTE — Transfer of Care (Signed)
Immediate Anesthesia Transfer of Care Note  Patient: Tiffany BameLinda E Linhardt  Procedure(s) Performed: Procedure(s): KYPHOPLASTY T11 (N/A)  Patient Location: PACU  Anesthesia Type:MAC  Level of Consciousness: sedated  Airway & Oxygen Therapy: Patient Spontanous Breathing and Patient connected to nasal cannula oxygen  Post-op Assessment: Report given to RN and Post -op Vital signs reviewed and stable  Post vital signs: Reviewed and stable  Last Vitals:  Vitals:   07/23/16 1201  BP: (!) 155/71  Pulse: 71  Resp: 16  Temp: 36.6 C    Last Pain:  Vitals:   07/23/16 1201  TempSrc: Oral  PainSc: 8          Complications: No apparent anesthesia complications

## 2016-07-23 NOTE — H&P (Signed)
Reviewed paper H+P, will be scanned into chart. No changes noted.  

## 2016-07-23 NOTE — Anesthesia Preprocedure Evaluation (Signed)
Anesthesia Evaluation  Patient identified by MRN, date of birth, ID band Patient awake    Reviewed: Allergy & Precautions, H&P , NPO status , Patient's Chart, lab work & pertinent test results  History of Anesthesia Complications Negative for: history of anesthetic complications  Airway Mallampati: III  TM Distance: <3 FB Neck ROM: full    Dental  (+) Poor Dentition, Chipped, Missing   Pulmonary neg pulmonary ROS, neg shortness of breath,    Pulmonary exam normal breath sounds clear to auscultation       Cardiovascular Exercise Tolerance: Good (-) angina(-) Past MI and (-) DOE Normal cardiovascular exam+ Valvular Problems/Murmurs  Rhythm:regular Rate:Normal     Neuro/Psych PSYCHIATRIC DISORDERS negative neurological ROS     GI/Hepatic Neg liver ROS, GERD  Controlled,  Endo/Other  negative endocrine ROS  Renal/GU negative Renal ROS  negative genitourinary   Musculoskeletal  (+) Arthritis ,   Abdominal   Peds  Hematology negative hematology ROS (+)   Anesthesia Other Findings Past Medical History: No date: Arthritis No date: At high risk for falls No date: GERD (gastroesophageal reflux disease) No date: Glaucoma of both eyes No date: Heart murmur No date: History of gastric ulcer No date: History of kidney stones No date: Hyperlipidemia No date: Left ureteral calculus No date: Urgency of urination  Past Surgical History: No date: BACK SURGERY 1992: CERVICAL SPINE SURGERY     Comment: posterior diskectomy No date: COLONOSCOPY 03/09/2013: CYSTOSCOPY WITH RETROGRADE PYELOGRAM, URETEROS* Left     Comment: Procedure: CYSTOSCOPY WITH RETROGRADE               PYELOGRAM, URETEROSCOPY AND LEFT STENT               PLACEMENT;  Surgeon: Sebastian Acheheodore Manny, MD;                [facility] [place];  Service:              Urology;  Laterality: Left; No date: ENDOMETRIAL ABLATION 1998: LAPAROSCOPIC  CHOLECYSTECTOMY 1995: PUBOVAGINAL SLING No date: TUBAL LIGATION No date: UPPER GI ENDOSCOPY  BMI    Body Mass Index:  32.77 kg/m      Reproductive/Obstetrics negative OB ROS                             Anesthesia Physical Anesthesia Plan  ASA: III  Anesthesia Plan: General   Post-op Pain Management:    Induction:   Airway Management Planned:   Additional Equipment:   Intra-op Plan:   Post-operative Plan:   Informed Consent: I have reviewed the patients History and Physical, chart, labs and discussed the procedure including the risks, benefits and alternatives for the proposed anesthesia with the patient or authorized representative who has indicated his/her understanding and acceptance.   Dental Advisory Given  Plan Discussed with: Anesthesiologist, CRNA and Surgeon  Anesthesia Plan Comments:         Anesthesia Quick Evaluation

## 2016-07-23 NOTE — Anesthesia Postprocedure Evaluation (Signed)
Anesthesia Post Note  Patient: Tiffany BameLinda E Mcbride  Procedure(s) Performed: Procedure(s) (LRB): KYPHOPLASTY T11 (N/A)  Patient location during evaluation: PACU Anesthesia Type: General Level of consciousness: awake and alert Pain management: pain level controlled Vital Signs Assessment: post-procedure vital signs reviewed and stable Respiratory status: spontaneous breathing, nonlabored ventilation, respiratory function stable and patient connected to nasal cannula oxygen Cardiovascular status: blood pressure returned to baseline and stable Postop Assessment: no signs of nausea or vomiting Anesthetic complications: no     Last Vitals:  Vitals:   07/23/16 1500 07/23/16 1509  BP: 138/71 (!) 144/73  Pulse: 66 66  Resp: 13 14  Temp:  (!) 35.8 C    Last Pain:  Vitals:   07/23/16 1509  TempSrc: Temporal  PainSc: 2                  Cleda MccreedyJoseph K Piscitello

## 2016-07-23 NOTE — Anesthesia Procedure Notes (Signed)
Date/Time: 07/23/2016 1:55 PM Performed by: Junious SilkNOLES, Kaeson Kleinert Pre-anesthesia Checklist: Patient identified, Emergency Drugs available, Suction available, Patient being monitored and Timeout performed Oxygen Delivery Method: Nasal cannula

## 2016-07-24 ENCOUNTER — Encounter: Payer: Self-pay | Admitting: Orthopedic Surgery

## 2016-07-28 LAB — SURGICAL PATHOLOGY

## 2016-07-30 DIAGNOSIS — E538 Deficiency of other specified B group vitamins: Secondary | ICD-10-CM | POA: Diagnosis not present

## 2016-08-06 DIAGNOSIS — S22080A Wedge compression fracture of T11-T12 vertebra, initial encounter for closed fracture: Secondary | ICD-10-CM | POA: Diagnosis not present

## 2016-08-11 DIAGNOSIS — H25043 Posterior subcapsular polar age-related cataract, bilateral: Secondary | ICD-10-CM | POA: Diagnosis not present

## 2016-08-11 DIAGNOSIS — H25013 Cortical age-related cataract, bilateral: Secondary | ICD-10-CM | POA: Diagnosis not present

## 2016-08-11 DIAGNOSIS — H2513 Age-related nuclear cataract, bilateral: Secondary | ICD-10-CM | POA: Diagnosis not present

## 2016-08-11 DIAGNOSIS — H401132 Primary open-angle glaucoma, bilateral, moderate stage: Secondary | ICD-10-CM | POA: Diagnosis not present

## 2016-08-11 DIAGNOSIS — H2512 Age-related nuclear cataract, left eye: Secondary | ICD-10-CM | POA: Diagnosis not present

## 2016-08-11 DIAGNOSIS — H02839 Dermatochalasis of unspecified eye, unspecified eyelid: Secondary | ICD-10-CM | POA: Diagnosis not present

## 2016-08-31 DIAGNOSIS — E538 Deficiency of other specified B group vitamins: Secondary | ICD-10-CM | POA: Diagnosis not present

## 2016-09-07 DIAGNOSIS — M79674 Pain in right toe(s): Secondary | ICD-10-CM | POA: Diagnosis not present

## 2016-09-07 DIAGNOSIS — M79675 Pain in left toe(s): Secondary | ICD-10-CM | POA: Diagnosis not present

## 2016-09-07 DIAGNOSIS — B351 Tinea unguium: Secondary | ICD-10-CM | POA: Diagnosis not present

## 2016-09-30 DIAGNOSIS — H40153 Residual stage of open-angle glaucoma, bilateral: Secondary | ICD-10-CM | POA: Diagnosis not present

## 2016-10-05 DIAGNOSIS — E538 Deficiency of other specified B group vitamins: Secondary | ICD-10-CM | POA: Diagnosis not present

## 2016-10-12 DIAGNOSIS — H2512 Age-related nuclear cataract, left eye: Secondary | ICD-10-CM | POA: Diagnosis not present

## 2016-10-13 DIAGNOSIS — H2511 Age-related nuclear cataract, right eye: Secondary | ICD-10-CM | POA: Diagnosis not present

## 2016-10-26 DIAGNOSIS — H2511 Age-related nuclear cataract, right eye: Secondary | ICD-10-CM | POA: Diagnosis not present

## 2016-11-19 DIAGNOSIS — L218 Other seborrheic dermatitis: Secondary | ICD-10-CM | POA: Diagnosis not present

## 2016-11-19 DIAGNOSIS — L718 Other rosacea: Secondary | ICD-10-CM | POA: Diagnosis not present

## 2016-11-19 DIAGNOSIS — L820 Inflamed seborrheic keratosis: Secondary | ICD-10-CM | POA: Diagnosis not present

## 2016-12-09 DIAGNOSIS — B351 Tinea unguium: Secondary | ICD-10-CM | POA: Diagnosis not present

## 2016-12-09 DIAGNOSIS — M79675 Pain in left toe(s): Secondary | ICD-10-CM | POA: Diagnosis not present

## 2016-12-09 DIAGNOSIS — M79674 Pain in right toe(s): Secondary | ICD-10-CM | POA: Diagnosis not present

## 2016-12-10 DIAGNOSIS — K219 Gastro-esophageal reflux disease without esophagitis: Secondary | ICD-10-CM | POA: Diagnosis not present

## 2016-12-10 DIAGNOSIS — E538 Deficiency of other specified B group vitamins: Secondary | ICD-10-CM | POA: Diagnosis not present

## 2016-12-10 DIAGNOSIS — Z8781 Personal history of (healed) traumatic fracture: Secondary | ICD-10-CM | POA: Insufficient documentation

## 2016-12-10 DIAGNOSIS — Z8639 Personal history of other endocrine, nutritional and metabolic disease: Secondary | ICD-10-CM | POA: Diagnosis not present

## 2016-12-10 DIAGNOSIS — N289 Disorder of kidney and ureter, unspecified: Secondary | ICD-10-CM | POA: Insufficient documentation

## 2016-12-10 DIAGNOSIS — R300 Dysuria: Secondary | ICD-10-CM | POA: Diagnosis not present

## 2016-12-10 DIAGNOSIS — Z780 Asymptomatic menopausal state: Secondary | ICD-10-CM | POA: Insufficient documentation

## 2016-12-10 DIAGNOSIS — E7800 Pure hypercholesterolemia, unspecified: Secondary | ICD-10-CM | POA: Insufficient documentation

## 2016-12-10 DIAGNOSIS — Z131 Encounter for screening for diabetes mellitus: Secondary | ICD-10-CM | POA: Diagnosis not present

## 2016-12-17 DIAGNOSIS — M8588 Other specified disorders of bone density and structure, other site: Secondary | ICD-10-CM | POA: Diagnosis not present

## 2017-01-11 DIAGNOSIS — E538 Deficiency of other specified B group vitamins: Secondary | ICD-10-CM | POA: Diagnosis not present

## 2017-01-13 DIAGNOSIS — H40153 Residual stage of open-angle glaucoma, bilateral: Secondary | ICD-10-CM | POA: Diagnosis not present

## 2017-01-18 DIAGNOSIS — H16223 Keratoconjunctivitis sicca, not specified as Sjogren's, bilateral: Secondary | ICD-10-CM | POA: Diagnosis not present

## 2017-01-21 ENCOUNTER — Ambulatory Visit: Payer: Self-pay | Admitting: Family Medicine

## 2017-01-21 DIAGNOSIS — H401132 Primary open-angle glaucoma, bilateral, moderate stage: Secondary | ICD-10-CM | POA: Diagnosis not present

## 2017-02-11 DIAGNOSIS — E538 Deficiency of other specified B group vitamins: Secondary | ICD-10-CM | POA: Diagnosis not present

## 2017-03-15 DIAGNOSIS — E538 Deficiency of other specified B group vitamins: Secondary | ICD-10-CM | POA: Diagnosis not present

## 2017-03-31 DIAGNOSIS — M79674 Pain in right toe(s): Secondary | ICD-10-CM | POA: Diagnosis not present

## 2017-03-31 DIAGNOSIS — B351 Tinea unguium: Secondary | ICD-10-CM | POA: Diagnosis not present

## 2017-03-31 DIAGNOSIS — M79675 Pain in left toe(s): Secondary | ICD-10-CM | POA: Diagnosis not present

## 2017-04-15 DIAGNOSIS — E538 Deficiency of other specified B group vitamins: Secondary | ICD-10-CM | POA: Diagnosis not present

## 2017-05-17 DIAGNOSIS — E538 Deficiency of other specified B group vitamins: Secondary | ICD-10-CM | POA: Diagnosis not present

## 2017-05-20 DIAGNOSIS — Z23 Encounter for immunization: Secondary | ICD-10-CM | POA: Diagnosis not present

## 2017-05-26 DIAGNOSIS — H40153 Residual stage of open-angle glaucoma, bilateral: Secondary | ICD-10-CM | POA: Diagnosis not present

## 2017-06-18 DIAGNOSIS — E538 Deficiency of other specified B group vitamins: Secondary | ICD-10-CM | POA: Diagnosis not present

## 2017-07-15 DIAGNOSIS — M79674 Pain in right toe(s): Secondary | ICD-10-CM | POA: Diagnosis not present

## 2017-07-15 DIAGNOSIS — B351 Tinea unguium: Secondary | ICD-10-CM | POA: Diagnosis not present

## 2017-07-15 DIAGNOSIS — M79675 Pain in left toe(s): Secondary | ICD-10-CM | POA: Diagnosis not present

## 2017-07-19 DIAGNOSIS — E538 Deficiency of other specified B group vitamins: Secondary | ICD-10-CM | POA: Diagnosis not present

## 2017-08-19 DIAGNOSIS — E538 Deficiency of other specified B group vitamins: Secondary | ICD-10-CM | POA: Diagnosis not present

## 2017-12-15 ENCOUNTER — Other Ambulatory Visit: Payer: Self-pay

## 2017-12-15 ENCOUNTER — Ambulatory Visit (INDEPENDENT_AMBULATORY_CARE_PROVIDER_SITE_OTHER): Payer: Medicare Other | Admitting: Podiatry

## 2017-12-15 ENCOUNTER — Encounter: Payer: Self-pay | Admitting: Podiatry

## 2017-12-15 VITALS — BP 139/77 | HR 61

## 2017-12-15 DIAGNOSIS — L600 Ingrowing nail: Secondary | ICD-10-CM

## 2017-12-15 NOTE — Patient Instructions (Signed)

## 2017-12-15 NOTE — Progress Notes (Signed)
Subjective:   Patient ID: Junie Bameith, female   DOB: 77 y.o.   MRN: [id number]   HPI Patient presents with significant ingrown toenail lateral border right hallux is very tender when pressed and make shoe gear difficult.  She has not noted any active drainage but she is had trouble with chronic ingrown toenails for years.  Patient does not smoke and likes to be active   Review of Systems  All other systems reviewed and are negative.       Objective:  Physical Exam  Constitutional: She appears well-developed and well-nourished.  Cardiovascular: Intact distal pulses.  Pulmonary/Chest: Effort normal.  Musculoskeletal: Normal range of motion.  Neurological: She is alert.  Skin: Skin is warm.  Nursing note and vitals reviewed.   Neurovascular status intact muscle strength is adequate range of motion within normal limits with patient noted to have incurvated right hallux lateral border that is very painful when pressed and make shoe gear difficult.  I did not note any active drainage or any other redness or other infective pathology.  Patient has good digital perfusion well oriented x3     Assessment:  Chronic ingrown toenail deformity hallux right over left     Plan:  H&P condition reviewed and recommended correction of deformity.  I explained procedure and risk and allowed her to sign consent form I then went ahead and infiltrated the right hallux 60 mill grams like Marcaine mixture sterile prep applied using sterile instrumentation remove the lateral border exposed matrix applied phenol 3 applications 30 seconds followed by alcohol lavage sterile dressing and gave instructions on soaks and reappoint.  Patient will be seen back as needed and is encouraged to call with any questions

## 2017-12-28 ENCOUNTER — Ambulatory Visit (INDEPENDENT_AMBULATORY_CARE_PROVIDER_SITE_OTHER): Payer: Medicare Other | Admitting: Podiatry

## 2017-12-28 ENCOUNTER — Encounter: Payer: Self-pay | Admitting: Podiatry

## 2017-12-28 DIAGNOSIS — L03031 Cellulitis of right toe: Secondary | ICD-10-CM

## 2017-12-28 DIAGNOSIS — L02611 Cutaneous abscess of right foot: Secondary | ICD-10-CM

## 2017-12-28 MED ORDER — GENTAMICIN SULFATE 0.1 % EX CREA: 1.0000 "application " | TOPICAL_CREAM | Freq: Three times a day (TID) | CUTANEOUS | 1 refills | Status: DC

## 2017-12-28 MED ORDER — DOXYCYCLINE HYCLATE 100 MG PO TABS: 100.0000 mg | ORAL_TABLET | Freq: Two times a day (BID) | ORAL | 0 refills | Status: DC

## 2017-12-29 NOTE — Progress Notes (Signed)
   HPI: 77 year old female presents the office today for concern for an infection to the lateral border of the right hallux.  She says it is very sore to touch and is red and swollen with some drainage.  The patient had an ingrown toenail procedure performed on 12/15/2017 to the same border.  Patient states that she is doing very well until proximally last week when she noticed some infection setting in with drainage and increased amount of pain.  Past Medical History:  Diagnosis Date  . Arthritis   . At high risk for falls   . GERD (gastroesophageal reflux disease)   . Glaucoma of both eyes   . Heart murmur   . History of gastric ulcer   . History of kidney stones   . Hyperlipidemia   . Left ureteral calculus   . Urgency of urination      Physical Exam: General: The patient is alert and oriented x3 in no acute distress.  Dermatology: Evidence of an ingrowing nail with some purulent drainage and likely localized abscess to the lateral border of the right great toe noted.  There is some erythema localized around the lateral border of the right hallux.  Skin is warm, dry and supple bilateral lower extremities. Negative for open lesions or macerations.  Vascular: Palpable pedal pulses bilaterally. No edema or erythema noted. Capillary refill within normal limits.  Neurological: Epicritic and protective threshold grossly intact bilaterally.   Musculoskeletal Exam: Range of motion within normal limits to all pedal and ankle joints bilateral. Muscle strength 5/5 in all groups bilateral.  Significant pain to palpation around the right hallux lateral border  Assessment: 1.  Abscess right hallux lateral border nail fold   Plan of Care:  1. Patient evaluated.   2.  Incision and drainage was performed of the abscess lesion using a tissue nipper and curette.  Purulent drainage was expressed in the abscess lesion was cleansed. 3.  Prescription for gentamicin cream 4.  Prescription for  doxycycline 100 mg #20 5.  Return to clinic in 2 weeks      Felecia Shelling, DPM [facility]  Dr. Felecia Shelling, DPM    [address] [place], [place] [address]                Office [phone]  Fax [phone]

## 2018-01-11 ENCOUNTER — Ambulatory Visit (INDEPENDENT_AMBULATORY_CARE_PROVIDER_SITE_OTHER): Payer: Medicare Other | Admitting: Podiatry

## 2018-01-11 ENCOUNTER — Encounter: Payer: Self-pay | Admitting: Podiatry

## 2018-01-11 DIAGNOSIS — L600 Ingrowing nail: Secondary | ICD-10-CM

## 2018-01-12 NOTE — Progress Notes (Signed)
   Subjective: 77 year old female presents the office today for follow-up evaluation regarding localized abscess to the lateral border of the right hallux.  She states she is doing a little bit better she continues to be sore however.  She had an ingrown procedure performed several weeks prior by another physician on 12/15/2017.  She believes there is minimal improvement and she still continues to have pain regarding the toe.  Past Medical History:  Diagnosis Date  . Arthritis   . At high risk for falls   . GERD (gastroesophageal reflux disease)   . Glaucoma of both eyes   . Heart murmur   . History of gastric ulcer   . History of kidney stones   . Hyperlipidemia   . Left ureteral calculus   . Urgency of urination     Objective:  General: Well developed, nourished, in no acute distress, alert and oriented x3   Dermatology: Skin is warm, dry and supple bilateral.  Lateral border of the right hallux appears to be erythematous with evidence of an ingrowing nail. Pain on palpation noted to the border of the nail fold. The remaining nails appear unremarkable at this time. There are no open sores, lesions.  Vascular: Dorsalis Pedis artery and Posterior Tibial artery pedal pulses palpable. No lower extremity edema noted.   Neruologic: Grossly intact via light touch bilateral.  Musculoskeletal: Muscular strength within normal limits in all groups bilateral. Normal range of motion noted to all pedal and ankle joints.   Assesement: #1 Paronychia with ingrowing nail lateral border right hallux #2 Pain in toe #3 Incurvated nail  Plan of Care:  1. Patient evaluated.  2. Discussed treatment alternatives and plan of care. Explained nail avulsion procedure and post procedure course to patient. 3. Patient opted for temporary partial nail avulsion.  4. Prior to procedure, local anesthesia infiltration utilized using 3 ml of a 50:50 mixture of 2% plain lidocaine and 0.5% plain marcaine in a normal  hallux block fashion and a betadine prep performed.  5. Partial temporary t nail avulsion without chemical matrixectomy performed using 3x30sec applications of phenol followed by alcohol flush.  6. Light dressing applied.  Continue gentamicin cream daily 7. Return to clinic in 2 weeks.   Felecia ShellingBrent M. Ernestine Langworthy, DPM [facility]  Dr. Felecia ShellingBrent M. Alie Hardgrove, DPM    [address]                                        [place], [place] [address]                Office [phone]  Fax [phone]

## 2018-01-28 ENCOUNTER — Encounter: Payer: Self-pay | Admitting: Podiatry

## 2018-01-28 ENCOUNTER — Ambulatory Visit (INDEPENDENT_AMBULATORY_CARE_PROVIDER_SITE_OTHER): Payer: Medicare Other | Admitting: Podiatry

## 2018-01-28 DIAGNOSIS — L600 Ingrowing nail: Secondary | ICD-10-CM

## 2018-02-01 NOTE — Progress Notes (Signed)
   Subjective: Patient presents today 2 weeks post ingrown nail permanent nail avulsion procedure to the lateral border of the right hallux. Patient states that the toe and nail fold is feeling much better. Patient is here for further evaluation and treatment.   Past Medical History:  Diagnosis Date  . Arthritis   . At high risk for falls   . GERD (gastroesophageal reflux disease)   . Glaucoma of both eyes   . Heart murmur   . History of gastric ulcer   . History of kidney stones   . Hyperlipidemia   . Left ureteral calculus   . Urgency of urination     Objective: Skin is warm, dry and supple. Nail and respective nail fold appears to be healing appropriately. Open wound to the associated nail fold with a granular wound base and moderate amount of fibrotic tissue. Minimal drainage noted. Mild erythema around the periungual region likely due to phenol chemical matricectomy.  Assessment: #1 postop permanent partial nail avulsion lateral border right hallux  #2 open wound periungual nail fold of respective digit.   Plan of care: #1 patient was evaluated  #2 debridement of open wound was performed to the periungual border of the respective toe using a currette. Antibiotic ointment and Band-Aid was applied. #3 patient is to return to clinic on a PRN basis.   Felecia ShellingBrent M. Shandale Malak, DPM [facility]  Dr. Felecia ShellingBrent M. Prajna Vanderpool, DPM    [address]                                        [place], [place] [address]                Office [phone]  Fax [phone]

## 2018-02-09 DIAGNOSIS — L718 Other rosacea: Secondary | ICD-10-CM | POA: Diagnosis not present

## 2018-02-09 DIAGNOSIS — L821 Other seborrheic keratosis: Secondary | ICD-10-CM | POA: Diagnosis not present

## 2018-03-12 IMAGING — CT CT ABD-PELV W/ CM
2 of 5 series · 17 of 46 positions shown, 19 images · IV contrast (APPLIED)
Comparison: CT scan of February 09, 2013.

CLINICAL DATA: Back pain after all last week.

EXAM:
CT ABDOMEN AND PELVIS WITH CONTRAST
TECHNIQUE: Multidetector CT imaging of the abdomen and pelvis was performed
using the standard protocol following bolus administration of
intravenous contrast.
CONTRAST:  100mL [id number] IOPAMIDOL ([id number]) INJECTION 61%

[Series 2: routine abd/pel with · axial · 0.91mm/px · z∈[-1016,-562]mm · 14 of 103 slices shown, 16 images]
[im 6/103  soft-tissue]
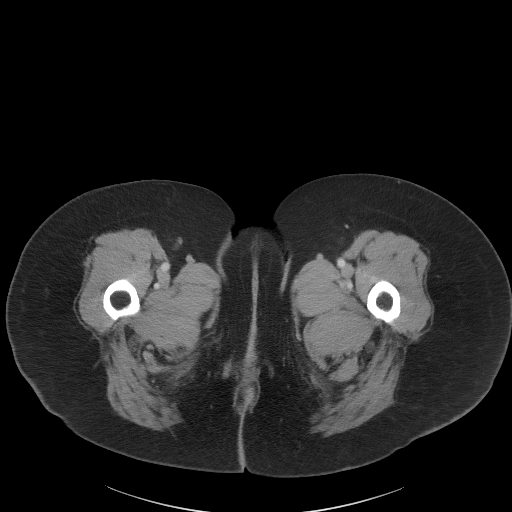
[im 6/103  bone]
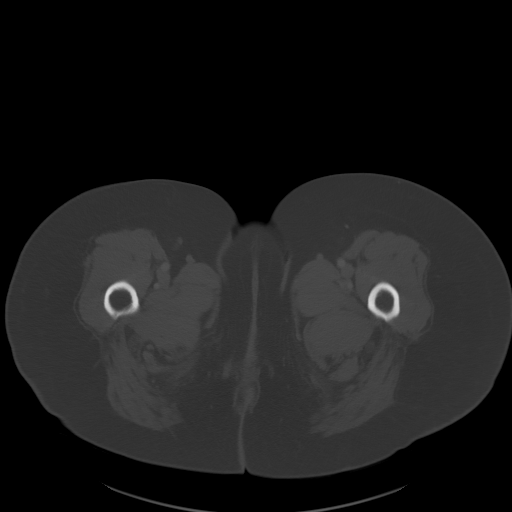
[im 11/103  soft-tissue]
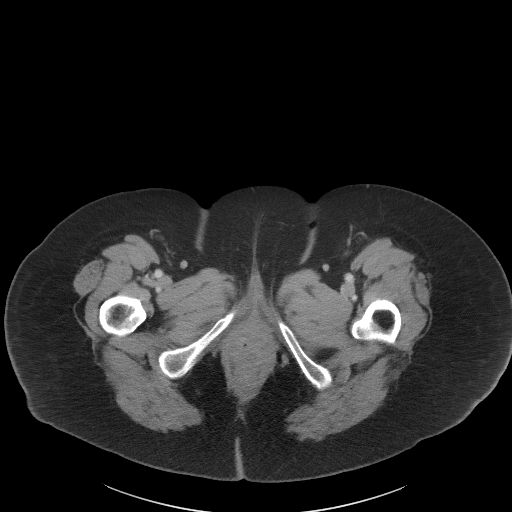
[im 22/103  soft-tissue]
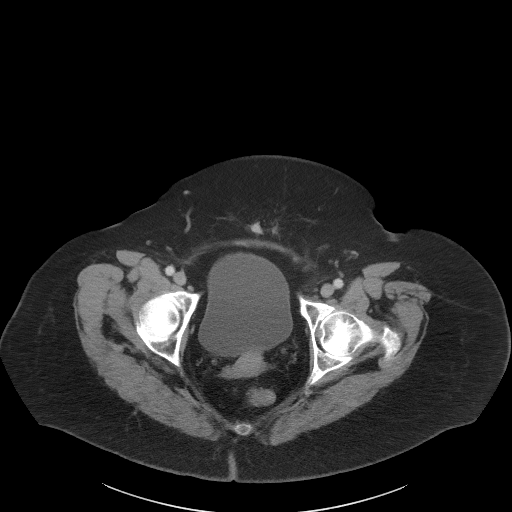
[im 27/103  soft-tissue]
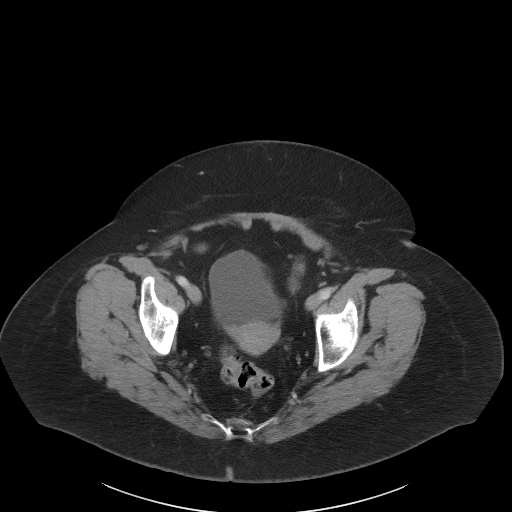
[im 33/103  soft-tissue]
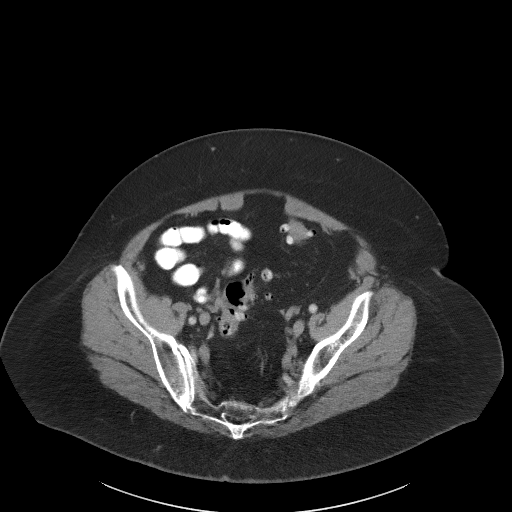
[im 43/103  soft-tissue]
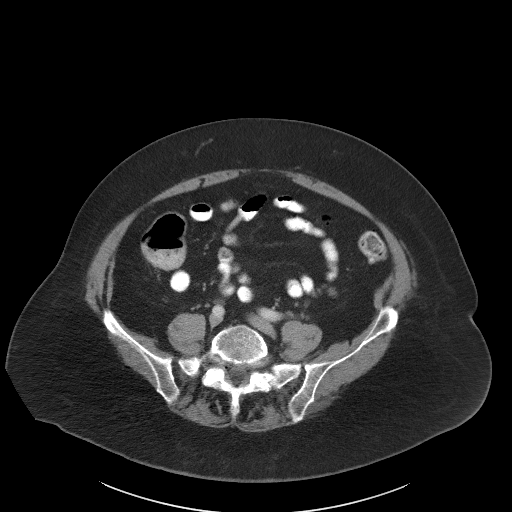
[im 49/103  soft-tissue]
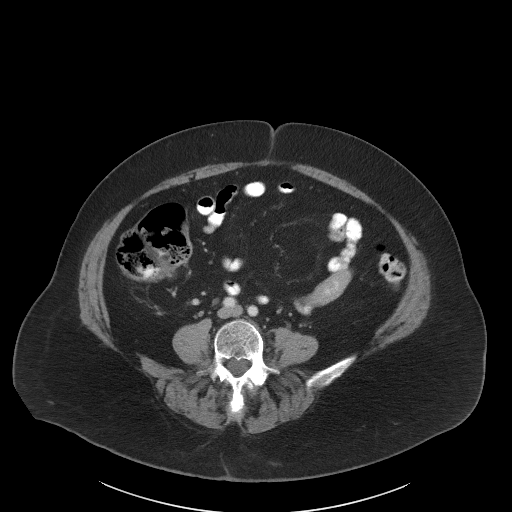
[im 54/103  soft-tissue]
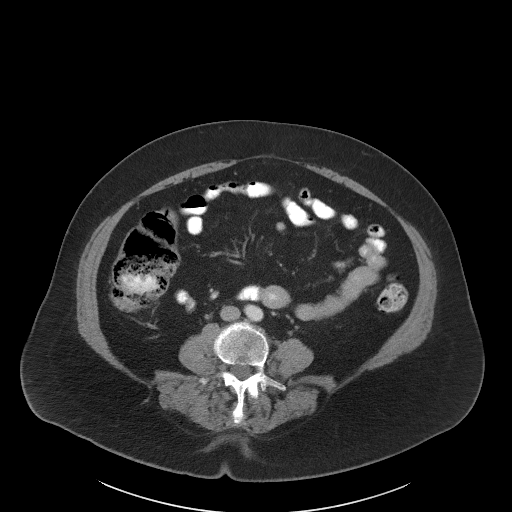
[im 60/103  soft-tissue]
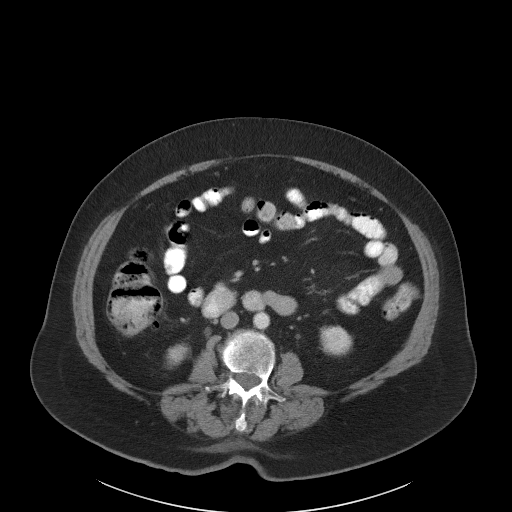
[im 60/103  bone]
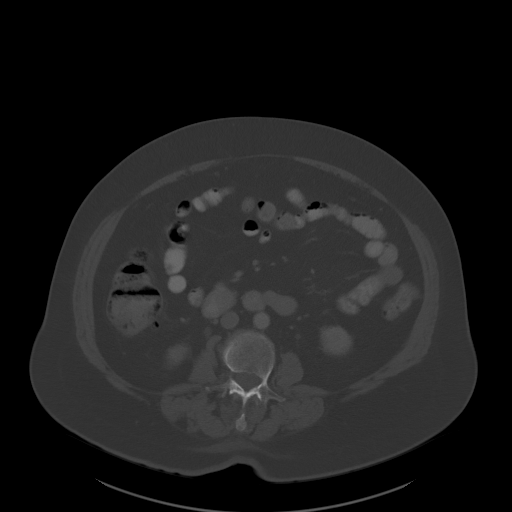
[im 70/103  soft-tissue]
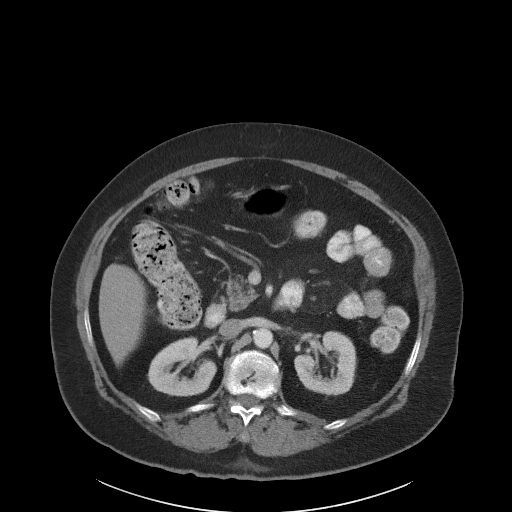
[im 76/103  soft-tissue]
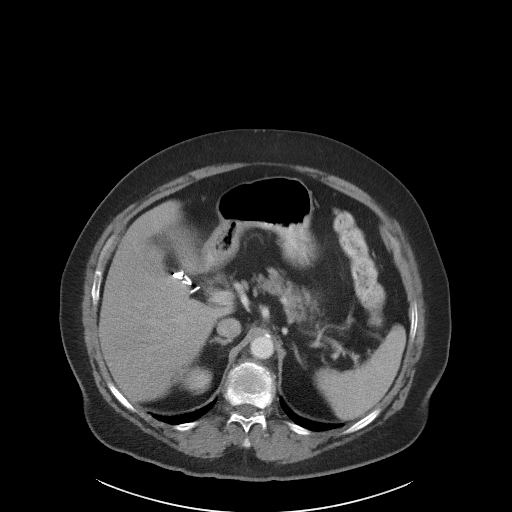
[im 81/103  soft-tissue]
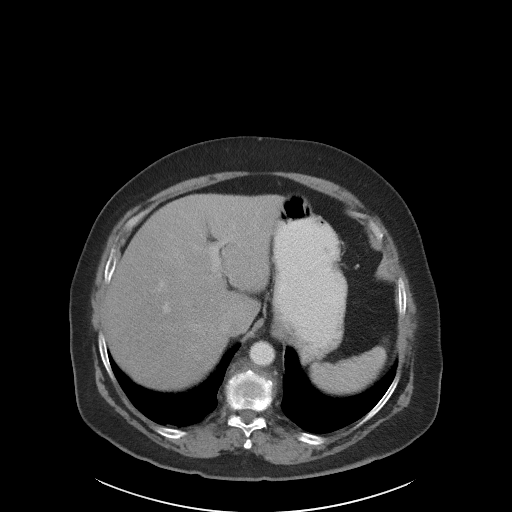
[im 92/103  soft-tissue]
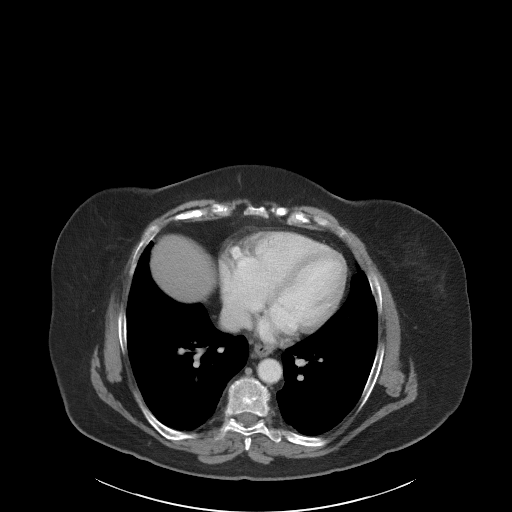
[im 97/103  soft-tissue]
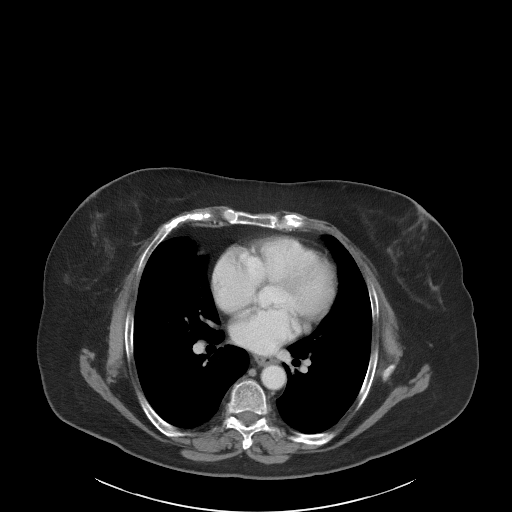

[Series 5: coronal st · coronal · 0.87mm/px · 3 of 102 slices shown]
[im 34/102  soft-tissue]
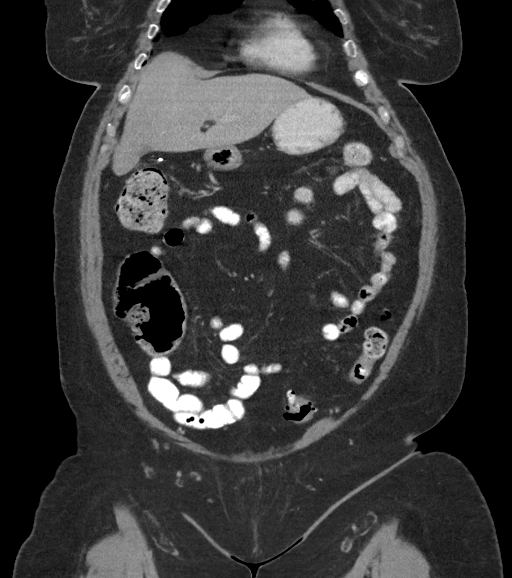
[im 45/102  soft-tissue]
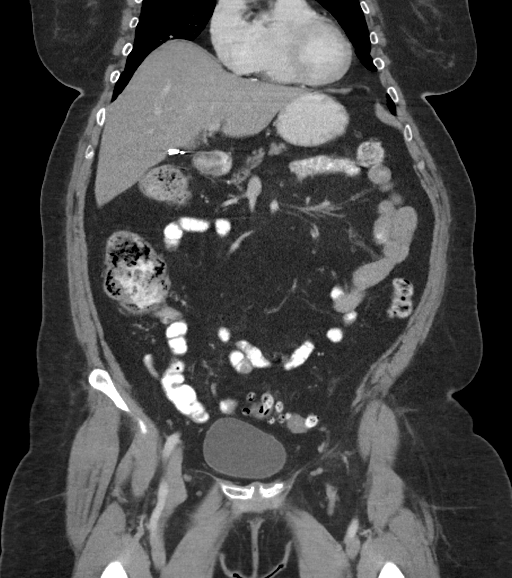
[im 57/102  soft-tissue]
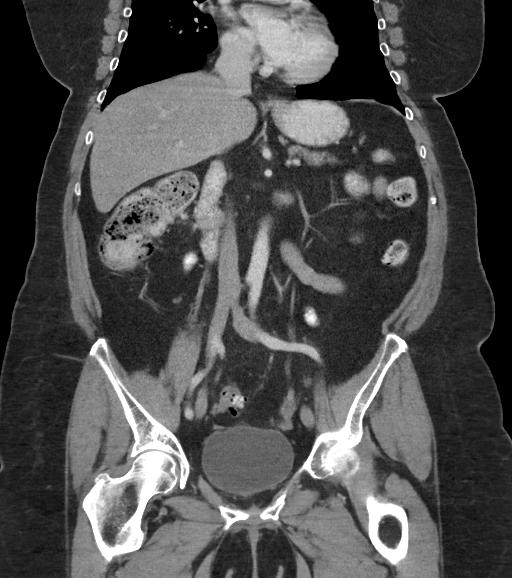

[17 of 46 positions shown; findings below may reference images not displayed]

FINDINGS: Lower chest: No acute abnormality.

Hepatobiliary: No focal liver abnormality is seen. Status post
cholecystectomy. No biliary dilatation.

Pancreas: Unremarkable. No pancreatic ductal dilatation or
surrounding inflammatory changes.

Spleen: Normal in size without focal abnormality.

Adrenals/Urinary Tract: Adrenal glands are unremarkable. Kidneys are
normal, without renal calculi, focal lesion, or hydronephrosis.
Bladder is unremarkable.

Stomach/Bowel: The appendix appears normal. There is no evidence of
bowel obstruction. Diverticulosis of descending and sigmoid colon is
noted without inflammation.

Vascular/Lymphatic: Aortic atherosclerosis. No enlarged abdominal or
pelvic lymph nodes.

Reproductive: Uterus and bilateral adnexa are unremarkable.

Other: No abdominal wall hernia or abnormality. No abdominopelvic
ascites.

Musculoskeletal: Severe multilevel degenerative disc disease is
noted in the lumbar spine.
IMPRESSION: Aortic atherosclerosis.

Diverticulosis of descending and sigmoid colon without inflammation.

No acute abnormality seen in the abdomen or pelvis.

## 2018-04-11 ENCOUNTER — Ambulatory Visit (INDEPENDENT_AMBULATORY_CARE_PROVIDER_SITE_OTHER): Payer: Medicare Other | Admitting: Podiatry

## 2018-04-11 ENCOUNTER — Ambulatory Visit: Payer: Medicare Other

## 2018-04-11 ENCOUNTER — Encounter: Payer: Self-pay | Admitting: Podiatry

## 2018-04-11 DIAGNOSIS — B351 Tinea unguium: Secondary | ICD-10-CM | POA: Diagnosis not present

## 2018-04-11 DIAGNOSIS — M79675 Pain in left toe(s): Secondary | ICD-10-CM | POA: Diagnosis not present

## 2018-04-11 DIAGNOSIS — M722 Plantar fascial fibromatosis: Secondary | ICD-10-CM

## 2018-04-11 DIAGNOSIS — M79674 Pain in right toe(s): Secondary | ICD-10-CM | POA: Diagnosis not present

## 2018-04-11 MED ORDER — MELOXICAM 7.5 MG PO TABS: 7.5000 mg | ORAL_TABLET | Freq: Every day | ORAL | 0 refills | Status: DC

## 2018-04-11 NOTE — Progress Notes (Signed)
This patient presents to the office with chief complaint of a painful left heel.  Patient states the pain has become severe over the last week.  She says it is painful and she is unable to bear weight on her left heel.  She denies any history of trauma or injury to the foot.  She says that her pain level is approximately 10 out of 10.  She says she has soaked her foot and Epson salts but pain persists.  She presents to the office today for evaluation and treatment of her painful left heel.  Patient also states that she has developed pain from the long thick nails on both feet.  These nails are painful walking and wearing shoes.  She presents the office today for an evaluation and treatment of her feet.    General Appearance  Alert, conversant and in no acute stress.  Vascular  Dorsalis pedis and posterior tibial  pulses are palpable  bilaterally.  Capillary return is within normal limits  bilaterally. Temperature is within normal limits  bilaterally.  Neurologic  Senn-Weinstein monofilament wire test within normal limits  bilaterally. Muscle power within normal limits bilaterally.  Nails Thick disfigured discolored nails with subungual debris  from hallux to fifth toes bilaterally. No evidence of bacterial infection or drainage bilaterally.  Orthopedic  No limitations of motion  feet .  No crepitus or effusions noted.  No bony pathology or digital deformities noted. Palpable pain noted at the insertion of the plantar fascia left heel.  Skin  normotropic skin with no porokeratosis noted bilaterally.  No signs of infections or ulcers noted.    Plantar fasciitis left heel.  Onychomycosis.  Debridement of nails  X 10.  Discussed stretching and icing of her left foot with this patient.  Prescribed Mobic to be daily.  Injection therapy.  Injection therapy using 1.0 cc. Of 2% xylocaine( 20 mg.) plus 1 cc. of kenalog-la ( 10 mg) plus 1/2 cc. of dexamethazone phosphate ( 2 mg).  RTC 10 days.   Helane Gunther DPM

## 2018-04-11 NOTE — Patient Instructions (Addendum)

## 2018-04-13 ENCOUNTER — Telehealth: Payer: Self-pay | Admitting: Podiatry

## 2018-04-13 ENCOUNTER — Other Ambulatory Visit: Payer: Self-pay | Admitting: Podiatry

## 2018-04-13 NOTE — Telephone Encounter (Signed)
Dr. Stacie Acres prescribed me meloxicam on Monday and I cannot take this medication because it's making her sick. Requested a call back at [phone].

## 2018-04-21 ENCOUNTER — Encounter: Payer: Self-pay | Admitting: Podiatry

## 2018-04-21 ENCOUNTER — Ambulatory Visit (INDEPENDENT_AMBULATORY_CARE_PROVIDER_SITE_OTHER): Payer: Medicare Other | Admitting: Podiatry

## 2018-04-21 DIAGNOSIS — M722 Plantar fascial fibromatosis: Secondary | ICD-10-CM

## 2018-04-21 NOTE — Progress Notes (Signed)
This patient returns to the office for continued evaluation and treatment of her left heel.  She says that her foot has improved with the injection from last visit.  She says she difficulty  taking the Mobi.  She says she has purchased a new pair of shoes and insoles for her feet.  She says her pain  is approximately 8 out of 10 and is pleased with her improvement.  She presents the office today for continued evaluation and treatment of her left heel.  Vascular  Dorsalis pedis and posterior tibial pulses are palpable  B/L.  Capillary return  WNL.  Temperature gradient is  WNL.  Skin turgor  WNL  Sensorium  Senn Weinstein monofilament wire  WNL. Normal tactile sensation.  Nail Exam  Patient has normal nails with no evidence of bacterial or fungal infection. Pincer nails hallux  B/L.  Orthopedic  Exam  Muscle tone and muscle strength  WNL.  No limitations of motion feet  B/L.  No crepitus or joint effusion noted.  Foot type is unremarkable and digits show no abnormalities.  Bony prominences are unremarkable. Palpable pain at the insertion plantar fascia left foot.  Skin  No open lesions.  Normal skin texture and turgor.  Plantar fascitis left foot.    ROV.  Discussed this condition with this patient.  She says the meloxicam caused her to become dizzy and then she discontinued the medication.  She says she purchased a new pair of shoes and insoles and has just started wearing them.  We discussed her condition and treated her with another injection into her left heel.  Injection therapy using 1.0 cc. Of 2% xylocaine( 20 mg.) plus 1 cc. of kenalog-la ( 10 mg) plus 1/2 cc. of dexamethazone phosphate ( 2 mg).  Told her she could take Advil 200 mg to tolerance.  RTC 4 weeks.      Helane GuntherGregory Alexander Mcauley DPM

## 2018-04-21 NOTE — Telephone Encounter (Signed)
Patient being seen in office today.

## 2018-05-26 ENCOUNTER — Ambulatory Visit: Payer: Medicare Other | Admitting: Podiatry

## 2018-06-10 ENCOUNTER — Encounter: Payer: Self-pay | Admitting: Podiatry

## 2018-06-10 ENCOUNTER — Ambulatory Visit (INDEPENDENT_AMBULATORY_CARE_PROVIDER_SITE_OTHER): Payer: Medicare Other | Admitting: Podiatry

## 2018-06-10 DIAGNOSIS — M722 Plantar fascial fibromatosis: Secondary | ICD-10-CM | POA: Diagnosis not present

## 2018-06-10 MED ORDER — METHYLPREDNISOLONE 4 MG PO TBPK: ORAL_TABLET | ORAL | 0 refills | Status: DC

## 2018-06-10 NOTE — Progress Notes (Signed)
   Subjective: 77 year old female presenting today for follow up evaluation of left foot pain. She reports continued pain that is worsened by walking. She has been taking OTC Motrin and Tylenol for treatment. Patient is here for further evaluation and treatment.   Past Medical History:  Diagnosis Date  . Arthritis   . At high risk for falls   . GERD (gastroesophageal reflux disease)   . Glaucoma of both eyes   . Heart murmur   . History of gastric ulcer   . History of kidney stones   . Hyperlipidemia   . Left ureteral calculus   . Urgency of urination      Objective: Physical Exam General: The patient is alert and oriented x3 in no acute distress.  Dermatology: Skin is warm, dry and supple bilateral lower extremities. Negative for open lesions or macerations bilateral.   Vascular: Dorsalis Pedis and Posterior Tibial pulses palpable bilateral.  Capillary fill time is immediate to all digits.  Neurological: Epicritic and protective threshold intact bilateral.   Musculoskeletal: Tenderness to palpation to the plantar aspect of the left heel along the plantar fascia. All other joints range of motion within normal limits bilateral. Strength 5/5 in all groups bilateral.    Assessment: 1. Plantar fasciitis left foot  Plan of Care:  1. Patient evaluated. Xrays reviewed.   2. Injection of 0.5cc Celestone soluspan injected into the left plantar fascia.  3. Rx for Medrol Dose Pak placed 4. Continue taking OTC Motrin and Tylenol as needed.  5. Plantar fascial band(s) dispensed  6. Instructed patient regarding therapies and modalities at home to alleviate symptoms.  7. Return to clinic in 5 weeks.     Felecia Shelling, DPM [facility]  Dr. Felecia Shelling, DPM    [address] [place], [place] [address]                Office [phone]  Fax [phone]

## 2018-06-29 ENCOUNTER — Ambulatory Visit (INDEPENDENT_AMBULATORY_CARE_PROVIDER_SITE_OTHER): Payer: Medicare Other | Admitting: Podiatry

## 2018-06-29 ENCOUNTER — Encounter: Payer: Self-pay | Admitting: Podiatry

## 2018-06-29 DIAGNOSIS — M722 Plantar fascial fibromatosis: Secondary | ICD-10-CM

## 2018-06-29 NOTE — Patient Instructions (Signed)
Pre-Operative Instructions  Congratulations, you have decided to take an important step towards improving your quality of life.  You can be assured that the doctors and staff at [facility] will be with you every step of the way.  Here are some important things you should know:  1. Plan to be at the surgery center/hospital at least 1 (one) hour prior to your scheduled time, unless otherwise directed by the surgical center/hospital staff.  You must have a responsible adult accompany you, remain during the surgery and drive you home.  Make sure you have directions to the surgical center/hospital to ensure you arrive on time. 2. If you are having surgery at [facility] or [facility], you will need a copy of your medical history and physical form from your family physician within one month prior to the date of surgery. We will give you a form for your primary physician to complete.  3. We make every effort to accommodate the date you request for surgery.  However, there are times where surgery dates or times have to be moved.  We will contact you as soon as possible if a change in schedule is required.   4. No aspirin/ibuprofen for one week before surgery.  If you are on aspirin, any non-steroidal anti-inflammatory medications (Mobic, Aleve, Ibuprofen) should not be taken seven (7) days prior to your surgery.  You make take Tylenol for pain prior to surgery.  5. Medications - If you are taking daily heart and blood pressure medications, seizure, reflux, allergy, asthma, anxiety, pain or diabetes medications, make sure you notify the surgery center/hospital before the day of surgery so they can tell you which medications you should take or avoid the day of surgery. 6. No food or drink after midnight the night before surgery unless directed otherwise by surgical center/hospital staff. 7. No alcoholic beverages 24-hours prior to surgery.  No smoking 24-hours prior or 24-hours after  surgery. 8. Wear loose pants or shorts. They should be loose enough to fit over bandages, boots, and casts. 9. Don't wear slip-on shoes. Sneakers are preferred. 10. Bring your boot with you to the surgery center/hospital.  Also bring crutches or a walker if your physician has prescribed it for you.  If you do not have this equipment, it will be provided for you after surgery. 11. If you have not been contacted by the surgery center/hospital by the day before your surgery, call to confirm the date and time of your surgery. 12. Leave-time from work may vary depending on the type of surgery you have.  Appropriate arrangements should be made prior to surgery with your employer. 13. Prescriptions will be provided immediately following surgery by your doctor.  Fill these as soon as possible after surgery and take the medication as directed. Pain medications will not be refilled on weekends and must be approved by the doctor. 14. Remove nail polish on the operative foot and avoid getting pedicures prior to surgery. 15. Wash the night before surgery.  The night before surgery wash the foot and leg well with water and the antibacterial soap provided. Be sure to pay special attention to beneath the toenails and in between the toes.  Wash for at least three (3) minutes. Rinse thoroughly with water and dry well with a towel.  Perform this wash unless told not to do so by your physician.  Enclosed: 1 Ice pack (please put in freezer the night before surgery)   1 Hibiclens skin cleaner     Pre-op instructions  If you have any questions regarding the instructions, please do not hesitate to call our office.  [place]: [address], [place], [place] [address] -- [phone]  [place]: [address] [place], [place] [address] -- [phone]  [place]: [address]  [place], [place] [address] -- [phone]  [place]: [address], [place], [place] [address] -- [phone]  Website:  [email or web address] 

## 2018-07-04 NOTE — Progress Notes (Signed)
   Subjective: 77 year old female presenting today for follow up evaluation of plantar fasciitis of the left foot. She states her pain has not changed since her last visit. She states she has been using the brace with no relief. She reports taking Prednisone but states it made her sick. Patient is here for further evaluation and treatment.   Past Medical History:  Diagnosis Date  . Arthritis   . At high risk for falls   . GERD (gastroesophageal reflux disease)   . Glaucoma of both eyes   . Heart murmur   . History of gastric ulcer   . History of kidney stones   . Hyperlipidemia   . Left ureteral calculus   . Urgency of urination      Objective: Physical Exam General: The patient is alert and oriented x3 in no acute distress.  Dermatology: Skin is warm, dry and supple bilateral lower extremities. Negative for open lesions or macerations bilateral.   Vascular: Dorsalis Pedis and Posterior Tibial pulses palpable bilateral.  Capillary fill time is immediate to all digits.  Neurological: Epicritic and protective threshold intact bilateral.   Musculoskeletal: Tenderness to palpation to the plantar aspect of the left heel along the plantar fascia. All other joints range of motion within normal limits bilateral. Strength 5/5 in all groups bilateral.    Assessment: 1. Plantar fasciitis left foot  Plan of Care:  1. Patient evaluated.  2. Today we discussed the conservative versus surgical management of the presenting pathology. The patient opts for surgical management. All possible complications and details of the procedure were explained. All patient questions were answered. No guarantees were expressed or implied. 3. Authorization for surgery was initiated today. Surgery will consist of EPF left.  4. Return to clinic one week post op.    Felecia ShellingBrent M. Maricruz Lucero, DPM [facility]  Dr. Felecia ShellingBrent M. Daryle Amis, DPM    [address]                                     [place],  [place] [address]                Office [phone]  Fax [phone]

## 2018-07-05 ENCOUNTER — Telehealth: Payer: Self-pay | Admitting: *Deleted

## 2018-07-05 NOTE — Telephone Encounter (Signed)
"  I'm a patient of Dr. Logan BoresEvans.  I came in there last week and scheduled surgery.  I was scheduled for January 30.  I can't wait for two months to have this done.  My toe is killing me.  Does Dr. Logan BoresEvans have anything sooner?"  He does not have anything sooner.  "Can I get Dr. Charlsie Merlesegal or Dr. Stacie AcresMayer to do the surgery?"  Dr. Stacie AcresMayer doesn't do surgery and Dr. Charlsie Merlesegal does not have anything available.  "How do you know, did you even look?"  I know because I am the scheduler for all the doctors.  "Did they write on my paper to call if there's any cancellations for a sooner date?"  Yes, they did.

## 2018-07-12 ENCOUNTER — Telehealth: Payer: Self-pay | Admitting: Podiatry

## 2018-07-12 NOTE — Telephone Encounter (Signed)
Sure, if there is a spot available.

## 2018-07-12 NOTE — Telephone Encounter (Signed)
I'm scheduled for surgery on 22 August 2018. Since I have already seen Dr. Logan BoresEvans, can I have my surgery on 13 th? I'm having a lot of pains in my foot and its running up and down my leg. Its going to be hard waiting until 20 January, I need to have it done before then. Please call me back at [phone].

## 2018-07-12 NOTE — Telephone Encounter (Signed)
As of right now, you have 3 cases scheduled for Friday afternoon for a total of 3 hours and 45 minutes. Would you like me to schedule Bonita QuinLinda this Friday?

## 2018-07-12 NOTE — Telephone Encounter (Signed)
Andreas BlowerHey Jessica, This surgery is just an EPF and takes like 15 minutes. Is there any way I could squeeze her in sooner than Jan 30. Thanks, Dr. Logan BoresEvans

## 2018-07-13 NOTE — Telephone Encounter (Signed)
Called pt to get her scheduled for tomorrow with Dr. Logan BoresEvans. Pt stated to go ahead and get her scheduled. I told her no eating or drinking anything after midnight and she will be getting a call from the surgical center today with what time to arrive.

## 2018-07-14 ENCOUNTER — Other Ambulatory Visit: Payer: Self-pay | Admitting: Podiatry

## 2018-07-14 DIAGNOSIS — M722 Plantar fascial fibromatosis: Secondary | ICD-10-CM | POA: Diagnosis not present

## 2018-07-14 MED ORDER — TRAMADOL HCL 50 MG PO TABS: 50.0000 mg | ORAL_TABLET | Freq: Three times a day (TID) | ORAL | 0 refills | Status: DC | PRN

## 2018-07-14 NOTE — Progress Notes (Signed)
.  postop

## 2018-07-15 ENCOUNTER — Ambulatory Visit: Payer: Medicare Other | Admitting: Podiatry

## 2018-07-22 ENCOUNTER — Ambulatory Visit (INDEPENDENT_AMBULATORY_CARE_PROVIDER_SITE_OTHER): Payer: Medicare Other | Admitting: Podiatry

## 2018-07-22 ENCOUNTER — Encounter: Payer: Self-pay | Admitting: Podiatry

## 2018-07-22 VITALS — BP 189/105 | HR 69 | Temp 96.5°F

## 2018-07-22 DIAGNOSIS — Z9889 Other specified postprocedural states: Secondary | ICD-10-CM

## 2018-07-22 DIAGNOSIS — M722 Plantar fascial fibromatosis: Secondary | ICD-10-CM

## 2018-07-25 NOTE — Progress Notes (Signed)
   Subjective:  Patient presents today status post EPF left. DOS: 07/14/18. She states she is doing well. She denies any pain or modifying factors. Patient is here for further evaluation and treatment.    Past Medical History:  Diagnosis Date  . Arthritis   . At high risk for falls   . GERD (gastroesophageal reflux disease)   . Glaucoma of both eyes   . Heart murmur   . History of gastric ulcer   . History of kidney stones   . Hyperlipidemia   . Left ureteral calculus   . Urgency of urination       Objective/Physical Exam Neurovascular status intact.  Skin incisions appear to be well coapted with sutures and staples intact. No sign of infectious process noted. No dehiscence. No active bleeding noted. Moderate edema noted to the surgical extremity.  Assessment: 1. s/p EPF left. DOS: 07/14/18   Plan of Care:  1. Patient was evaluated. 2. Recommended antibiotic ointment daily to incision site with a bandage.  3. Continue weightbearing in post op shoe.  4. Return to clinic in one week for suture removal.    Felecia ShellingBrent M. Evans, DPM [facility]  Dr. Felecia ShellingBrent M. Evans, DPM    [address]                                        [place], [place] [address]                Office [phone]  Fax [phone]([phone]

## 2018-08-02 ENCOUNTER — Ambulatory Visit (INDEPENDENT_AMBULATORY_CARE_PROVIDER_SITE_OTHER): Payer: Medicare Other | Admitting: Podiatry

## 2018-08-02 ENCOUNTER — Encounter: Payer: Self-pay | Admitting: Podiatry

## 2018-08-02 DIAGNOSIS — Z9889 Other specified postprocedural states: Secondary | ICD-10-CM

## 2018-08-02 DIAGNOSIS — M722 Plantar fascial fibromatosis: Secondary | ICD-10-CM

## 2018-08-05 NOTE — Progress Notes (Signed)
   Subjective:  Patient presents today status post EPF left. DOS: 07/14/18. She states she is doing well overall. She reports minimal soreness. She has been using the post op shoe as directed. She denies modifying factors. Patient is here for further evaluation and treatment.    Past Medical History:  Diagnosis Date  . Arthritis   . At high risk for falls   . GERD (gastroesophageal reflux disease)   . Glaucoma of both eyes   . Heart murmur   . History of gastric ulcer   . History of kidney stones   . Hyperlipidemia   . Left ureteral calculus   . Urgency of urination       Objective/Physical Exam Neurovascular status intact.  Skin incisions appear to be well coapted with sutures and staples intact. No sign of infectious process noted. No dehiscence. No active bleeding noted. Moderate edema noted to the surgical extremity.  Assessment: 1. s/p EPF left. DOS: 07/14/18   Plan of Care:  1. Patient was evaluated. 2. Sutures removed.  3. Transition out of post op shoe into good sneakers.  4. Return to clinic in 4 weeks.     Felecia Shelling, DPM [facility]  Dr. Felecia Shelling, DPM    [address]                                        [place], [place] [address]                Office [phone]  Fax [phone]

## 2018-08-30 ENCOUNTER — Encounter: Payer: Self-pay | Admitting: Podiatry

## 2018-08-30 ENCOUNTER — Ambulatory Visit (INDEPENDENT_AMBULATORY_CARE_PROVIDER_SITE_OTHER): Payer: Medicare Other | Admitting: Podiatry

## 2018-08-30 DIAGNOSIS — Z9889 Other specified postprocedural states: Secondary | ICD-10-CM

## 2018-08-30 DIAGNOSIS — M79675 Pain in left toe(s): Secondary | ICD-10-CM | POA: Diagnosis not present

## 2018-08-30 DIAGNOSIS — M722 Plantar fascial fibromatosis: Secondary | ICD-10-CM

## 2018-08-30 DIAGNOSIS — B351 Tinea unguium: Secondary | ICD-10-CM | POA: Diagnosis not present

## 2018-08-30 DIAGNOSIS — M79674 Pain in right toe(s): Secondary | ICD-10-CM

## 2018-09-06 NOTE — Progress Notes (Signed)
   Subjective:  Patient presents today status post EPF left. DOS: 07/14/18. She states she is improving. She reports some mild intermittent soreness. She denies any modifying factors.  She also complains of elongated, thickened nails of the bilateral feet that cause pain while ambulating in shoes. She is unable to trim her own nails. Patient is here for further evaluation and treatment.    Past Medical History:  Diagnosis Date  . Arthritis   . At high risk for falls   . GERD (gastroesophageal reflux disease)   . Glaucoma of both eyes   . Heart murmur   . History of gastric ulcer   . History of kidney stones   . Hyperlipidemia   . Left ureteral calculus   . Urgency of urination       Objective/Physical Exam Neurovascular status intact.  Skin incisions appear to be well coapted. No sign of infectious process noted. No dehiscence. No active bleeding noted. Moderate edema noted to the surgical extremity. Nails are tender, long, thickened and dystrophic with subungual debris, consistent with onychomycosis, 1-5 bilateral.   Assessment: 1. s/p EPF left. DOS: 07/14/18 2. Onychodystrophic nails 1-5 bilateral with hyperkeratosis of nails.  3. Onychomycosis of nail due to dermatophyte bilateral   Plan of Care:  1. Patient was evaluated. 2. Mechanical debridement of nails 1-5 bilaterally performed using a nail nipper. Filed with dremel without incident.  3. May resume full activity with no restrictions.  4. Return to clinic in 3 months for routine care.      Felecia ShellingBrent M. Alexios Keown, DPM [facility]  Dr. Felecia ShellingBrent M. Trason Shifflet, DPM    [address]                                        [place], [place] [address]                Office [phone](336) [phone]  Fax [phone]

## 2018-09-20 ENCOUNTER — Other Ambulatory Visit: Payer: Self-pay | Admitting: Podiatry

## 2018-09-20 ENCOUNTER — Ambulatory Visit (INDEPENDENT_AMBULATORY_CARE_PROVIDER_SITE_OTHER): Payer: Medicare Other | Admitting: Podiatry

## 2018-09-20 ENCOUNTER — Ambulatory Visit (INDEPENDENT_AMBULATORY_CARE_PROVIDER_SITE_OTHER): Payer: Medicare Other

## 2018-09-20 ENCOUNTER — Encounter: Payer: Self-pay | Admitting: Podiatry

## 2018-09-20 DIAGNOSIS — M7752 Other enthesopathy of left foot: Secondary | ICD-10-CM

## 2018-09-20 DIAGNOSIS — M779 Enthesopathy, unspecified: Principal | ICD-10-CM

## 2018-09-20 DIAGNOSIS — M778 Other enthesopathies, not elsewhere classified: Secondary | ICD-10-CM

## 2018-09-20 DIAGNOSIS — M722 Plantar fascial fibromatosis: Secondary | ICD-10-CM

## 2018-09-20 DIAGNOSIS — M79672 Pain in left foot: Secondary | ICD-10-CM

## 2018-09-20 DIAGNOSIS — Z9889 Other specified postprocedural states: Secondary | ICD-10-CM

## 2018-09-22 NOTE — Progress Notes (Signed)
   Subjective:  Patient presents today status post EPF left. DOS: 07/14/18. She states she is doing well overall. She reports some soreness of the dorsal left foot that began 1-2 weeks ago. She denies any modifying factors. She has been taking Tylenol for treatment. Patient is here for further evaluation and treatment.   Past Medical History:  Diagnosis Date  . Arthritis   . At high risk for falls   . GERD (gastroesophageal reflux disease)   . Glaucoma of both eyes   . Heart murmur   . History of gastric ulcer   . History of kidney stones   . Hyperlipidemia   . Left ureteral calculus   . Urgency of urination       Objective/Physical Exam Neurovascular status intact.  Skin incisions appear to be well coapted. No sign of infectious process noted. No dehiscence. No active bleeding noted.  Pain with palpation noted to the left dorsal midfoot.   Radiographic Exam:  Normal osseous mineralization. Joint spaces preserved. No fracture/dislocation/boney destruction.    Assessment: 1. s/p EPF left. DOS: 07/14/18 2. Left midfoot capsulitis    Plan of Care:  1. Patient was evaluated. X-Rays reviewed.  2. Injection of 0.5 mLs Celestone Soluspan injected into the left midfoot.  3. Continue taking OTC Tylenol Arthritis.  4. Patient cannot take Medrol or NSAIDs.  5. Recommended good shoe gear.  6. Return to clinic as needed.      Felecia Shelling, DPM [facility]  Dr. Felecia Shelling, DPM    [address]                                        [place], [place] [address]                Office [phone]  Fax [phone]

## 2018-10-12 ENCOUNTER — Encounter: Payer: Self-pay | Admitting: Podiatry

## 2018-10-14 ENCOUNTER — Ambulatory Visit: Payer: Medicare Other | Admitting: Podiatry

## 2018-11-01 ENCOUNTER — Ambulatory Visit: Payer: Medicare Other | Admitting: Podiatry

## 2018-11-25 ENCOUNTER — Ambulatory Visit (INDEPENDENT_AMBULATORY_CARE_PROVIDER_SITE_OTHER): Payer: Medicare Other | Admitting: Podiatry

## 2018-11-25 ENCOUNTER — Other Ambulatory Visit: Payer: Self-pay

## 2018-11-25 ENCOUNTER — Encounter: Payer: Self-pay | Admitting: Podiatry

## 2018-11-25 VITALS — Temp 97.8°F

## 2018-11-25 DIAGNOSIS — B351 Tinea unguium: Secondary | ICD-10-CM | POA: Diagnosis not present

## 2018-11-25 DIAGNOSIS — M779 Enthesopathy, unspecified: Principal | ICD-10-CM

## 2018-11-25 DIAGNOSIS — M79675 Pain in left toe(s): Secondary | ICD-10-CM | POA: Diagnosis not present

## 2018-11-25 DIAGNOSIS — M778 Other enthesopathies, not elsewhere classified: Secondary | ICD-10-CM

## 2018-11-25 DIAGNOSIS — M7752 Other enthesopathy of left foot: Secondary | ICD-10-CM

## 2018-11-25 DIAGNOSIS — M79674 Pain in right toe(s): Secondary | ICD-10-CM

## 2018-11-25 NOTE — Progress Notes (Signed)
   SUBJECTIVE Patient presents to office today complaining of elongated, thickened nails that cause pain while ambulating in shoes.  She is unable to trim her own nails.  Patient also has some residual left midfoot pain.  Last visit on 09/20/2018 she received an injection which helped significantly.  Pain is slowly recurred.  Patient is here for further evaluation and treatment.  Past Medical History:  Diagnosis Date  . Arthritis   . At high risk for falls   . GERD (gastroesophageal reflux disease)   . Glaucoma of both eyes   . Heart murmur   . History of gastric ulcer   . History of kidney stones   . Hyperlipidemia   . Left ureteral calculus   . Urgency of urination     OBJECTIVE General Patient is awake, alert, and oriented x 3 and in no acute distress. Derm Skin is dry and supple bilateral. Negative open lesions or macerations. Remaining integument unremarkable. Nails are tender, long, thickened and dystrophic with subungual debris, consistent with onychomycosis, 1-5 bilateral. No signs of infection noted. Vasc  DP and PT pedal pulses palpable bilaterally. Temperature gradient within normal limits.  Neuro Epicritic and protective threshold sensation grossly intact bilaterally.  Musculoskeletal Exam No symptomatic pedal deformities noted bilateral. Muscular strength within normal limits.  There is some pain on palpation to the left dorsal midfoot at the Lisfranc joint.  ASSESSMENT 1. Onychodystrophic nails 1-5 bilateral with hyperkeratosis of nails.  2. Onychomycosis of nail due to dermatophyte bilateral 3.  Capsulitis left foot  PLAN OF CARE 1. Patient evaluated today.  2. Instructed to maintain good pedal hygiene and foot care.  3. Mechanical debridement of nails 1-5 bilaterally performed using a nail nipper. Filed with dremel without incident.  4.  Injection of 0.5 cc Celestone Soluspan injected in the left midfoot  5.  Continue taking OTC Tylenol arthritis  6.  Patient cannot  take Medrol or NSAIDs  7.  Return to clinic in 3 mos. for routine care   Felecia Shelling, DPM [facility]  Dr. Felecia Shelling, DPM    [address]                                        [place], [place] [address]                Office [phone]  Fax [phone]

## 2018-11-29 ENCOUNTER — Ambulatory Visit: Payer: Medicare Other | Admitting: Podiatry

## 2019-02-17 ENCOUNTER — Encounter: Payer: Self-pay | Admitting: Podiatry

## 2019-02-17 ENCOUNTER — Ambulatory Visit (INDEPENDENT_AMBULATORY_CARE_PROVIDER_SITE_OTHER): Payer: Medicare Other | Admitting: Podiatry

## 2019-02-17 ENCOUNTER — Other Ambulatory Visit: Payer: Self-pay

## 2019-02-17 VITALS — Temp 97.1°F

## 2019-02-17 DIAGNOSIS — B351 Tinea unguium: Secondary | ICD-10-CM | POA: Diagnosis not present

## 2019-02-17 DIAGNOSIS — M79675 Pain in left toe(s): Secondary | ICD-10-CM | POA: Diagnosis not present

## 2019-02-17 DIAGNOSIS — M779 Enthesopathy, unspecified: Secondary | ICD-10-CM

## 2019-02-17 DIAGNOSIS — M79674 Pain in right toe(s): Secondary | ICD-10-CM | POA: Diagnosis not present

## 2019-02-17 DIAGNOSIS — M778 Other enthesopathies, not elsewhere classified: Secondary | ICD-10-CM

## 2019-02-20 NOTE — Progress Notes (Signed)
   SUBJECTIVE Patient presents to office today complaining of elongated, thickened nails that cause pain while ambulating in shoes. She is unable to trim her own nails. She is also here for follow up evaluation of left foot pain. She has been taking Tylenol for treatment and states the injections also help alleviate the pain. There are no worsening factors noted. Patient is here for further evaluation and treatment.  Past Medical History:  Diagnosis Date  . Arthritis   . At high risk for falls   . GERD (gastroesophageal reflux disease)   . Glaucoma of both eyes   . Heart murmur   . History of gastric ulcer   . History of kidney stones   . Hyperlipidemia   . Left ureteral calculus   . Urgency of urination     OBJECTIVE General Patient is awake, alert, and oriented x 3 and in no acute distress. Derm Skin is dry and supple bilateral. Negative open lesions or macerations. Remaining integument unremarkable. Nails are tender, long, thickened and dystrophic with subungual debris, consistent with onychomycosis, 1-5 bilateral. No signs of infection noted. Vasc  DP and PT pedal pulses palpable bilaterally. Temperature gradient within normal limits.  Neuro Epicritic and protective threshold sensation grossly intact bilaterally.  Musculoskeletal Exam No symptomatic pedal deformities noted bilateral. Muscular strength within normal limits.  There is some pain on palpation to the left dorsal midfoot at the Lisfranc joint.  ASSESSMENT 1. Onychodystrophic nails 1-5 bilateral with hyperkeratosis of nails.  2. Onychomycosis of nail due to dermatophyte bilateral 3. Capsulitis left foot  PLAN OF CARE 1. Patient evaluated today.  2. Instructed to maintain good pedal hygiene and foot care.  3. Mechanical debridement of nails 1-5 bilaterally performed using a nail nipper. Filed with dremel without incident.  4. Injection of 0.5 cc Celestone Soluspan injected in the left midfoot  5. Continue taking OTC  Tylenol arthritis  6. Patient cannot take Medrol or NSAIDs  7. Return to clinic in 3 mos. for routine care   Edrick Kins, DPM [facility]  Dr. Edrick Kins, [address]                                        [place], [place] [address]                Office [phone]  Fax [phone]

## 2019-03-14 ENCOUNTER — Other Ambulatory Visit: Payer: Self-pay | Admitting: Sports Medicine

## 2019-03-14 DIAGNOSIS — W19XXXA Unspecified fall, initial encounter: Secondary | ICD-10-CM

## 2019-03-14 DIAGNOSIS — M545 Low back pain, unspecified: Secondary | ICD-10-CM

## 2019-03-14 DIAGNOSIS — M47816 Spondylosis without myelopathy or radiculopathy, lumbar region: Secondary | ICD-10-CM

## 2019-03-14 DIAGNOSIS — M5136 Other intervertebral disc degeneration, lumbar region: Secondary | ICD-10-CM

## 2019-03-15 ENCOUNTER — Ambulatory Visit
Admission: RE | Admit: 2019-03-15 | Discharge: 2019-03-15 | Disposition: A | Discharge status: Home / Self Care | Payer: Medicare Other | Source: Ambulatory Visit | Attending: Sports Medicine | Admitting: Sports Medicine

## 2019-03-15 ENCOUNTER — Other Ambulatory Visit: Payer: Self-pay

## 2019-03-15 DIAGNOSIS — M47816 Spondylosis without myelopathy or radiculopathy, lumbar region: Secondary | ICD-10-CM | POA: Insufficient documentation

## 2019-03-15 DIAGNOSIS — M5136 Other intervertebral disc degeneration, lumbar region: Secondary | ICD-10-CM

## 2019-03-15 DIAGNOSIS — M48061 Spinal stenosis, lumbar region without neurogenic claudication: Secondary | ICD-10-CM | POA: Diagnosis not present

## 2019-03-15 DIAGNOSIS — M545 Low back pain, unspecified: Secondary | ICD-10-CM

## 2019-03-15 DIAGNOSIS — W19XXXA Unspecified fall, initial encounter: Secondary | ICD-10-CM | POA: Diagnosis not present

## 2019-03-20 ENCOUNTER — Other Ambulatory Visit
Admission: RE | Admit: 2019-03-20 | Discharge: 2019-03-20 | Disposition: A | Discharge status: Home / Self Care | Payer: Medicare Other | Source: Ambulatory Visit | Attending: Orthopedic Surgery | Admitting: Orthopedic Surgery

## 2019-03-20 ENCOUNTER — Other Ambulatory Visit: Payer: Self-pay

## 2019-03-20 DIAGNOSIS — Z01812 Encounter for preprocedural laboratory examination: Secondary | ICD-10-CM | POA: Insufficient documentation

## 2019-03-20 DIAGNOSIS — Z20828 Contact with and (suspected) exposure to other viral communicable diseases: Secondary | ICD-10-CM | POA: Diagnosis not present

## 2019-03-20 DIAGNOSIS — S32030A Wedge compression fracture of third lumbar vertebra, initial encounter for closed fracture: Secondary | ICD-10-CM | POA: Insufficient documentation

## 2019-03-20 LAB — SARS CORONAVIRUS 2 (TAT 6-24 HRS): SARS Coronavirus 2: NEGATIVE

## 2019-03-21 ENCOUNTER — Ambulatory Visit: Payer: Medicare Other

## 2019-03-21 ENCOUNTER — Encounter: Payer: Self-pay | Admitting: *Deleted

## 2019-03-21 ENCOUNTER — Encounter
Admission: RE | Disposition: A | Discharge status: Home / Self Care | Payer: Self-pay | Source: Home / Self Care | Attending: Orthopedic Surgery

## 2019-03-21 ENCOUNTER — Other Ambulatory Visit: Payer: Self-pay

## 2019-03-21 ENCOUNTER — Ambulatory Visit
Admission: RE | Admit: 2019-03-21 | Discharge: 2019-03-21 | Disposition: A | Discharge status: Home / Self Care | Payer: Medicare Other | Attending: Orthopedic Surgery | Admitting: Orthopedic Surgery

## 2019-03-21 ENCOUNTER — Ambulatory Visit: Payer: Medicare Other | Admitting: Anesthesiology

## 2019-03-21 DIAGNOSIS — Z8249 Family history of ischemic heart disease and other diseases of the circulatory system: Secondary | ICD-10-CM | POA: Diagnosis not present

## 2019-03-21 DIAGNOSIS — Z87442 Personal history of urinary calculi: Secondary | ICD-10-CM | POA: Diagnosis not present

## 2019-03-21 DIAGNOSIS — E7800 Pure hypercholesterolemia, unspecified: Secondary | ICD-10-CM | POA: Diagnosis not present

## 2019-03-21 DIAGNOSIS — E538 Deficiency of other specified B group vitamins: Secondary | ICD-10-CM | POA: Diagnosis present

## 2019-03-21 DIAGNOSIS — Z79899 Other long term (current) drug therapy: Secondary | ICD-10-CM | POA: Diagnosis not present

## 2019-03-21 DIAGNOSIS — E785 Hyperlipidemia, unspecified: Secondary | ICD-10-CM | POA: Diagnosis present

## 2019-03-21 DIAGNOSIS — M1990 Unspecified osteoarthritis, unspecified site: Secondary | ICD-10-CM | POA: Diagnosis not present

## 2019-03-21 DIAGNOSIS — Z419 Encounter for procedure for purposes other than remedying health state, unspecified: Secondary | ICD-10-CM

## 2019-03-21 DIAGNOSIS — E669 Obesity, unspecified: Secondary | ICD-10-CM | POA: Insufficient documentation

## 2019-03-21 DIAGNOSIS — Z8042 Family history of malignant neoplasm of prostate: Secondary | ICD-10-CM | POA: Diagnosis not present

## 2019-03-21 DIAGNOSIS — I10 Essential (primary) hypertension: Secondary | ICD-10-CM | POA: Insufficient documentation

## 2019-03-21 DIAGNOSIS — R011 Cardiac murmur, unspecified: Secondary | ICD-10-CM | POA: Insufficient documentation

## 2019-03-21 DIAGNOSIS — K219 Gastro-esophageal reflux disease without esophagitis: Secondary | ICD-10-CM | POA: Diagnosis not present

## 2019-03-21 DIAGNOSIS — S32030A Wedge compression fracture of third lumbar vertebra, initial encounter for closed fracture: Secondary | ICD-10-CM | POA: Insufficient documentation

## 2019-03-21 DIAGNOSIS — Z9049 Acquired absence of other specified parts of digestive tract: Secondary | ICD-10-CM | POA: Insufficient documentation

## 2019-03-21 DIAGNOSIS — G43909 Migraine, unspecified, not intractable, without status migrainosus: Secondary | ICD-10-CM | POA: Insufficient documentation

## 2019-03-21 DIAGNOSIS — Z8711 Personal history of peptic ulcer disease: Secondary | ICD-10-CM | POA: Diagnosis not present

## 2019-03-21 DIAGNOSIS — W06XXXA Fall from bed, initial encounter: Secondary | ICD-10-CM | POA: Insufficient documentation

## 2019-03-21 DIAGNOSIS — Z6834 Body mass index (BMI) 34.0-34.9, adult: Secondary | ICD-10-CM | POA: Diagnosis not present

## 2019-03-21 DIAGNOSIS — Y92003 Bedroom of unspecified non-institutional (private) residence as the place of occurrence of the external cause: Secondary | ICD-10-CM | POA: Insufficient documentation

## 2019-03-21 DIAGNOSIS — H409 Unspecified glaucoma: Secondary | ICD-10-CM | POA: Insufficient documentation

## 2019-03-21 DIAGNOSIS — Y9389 Activity, other specified: Secondary | ICD-10-CM | POA: Insufficient documentation

## 2019-03-21 HISTORY — PX: KYPHOPLASTY: SHX5884

## 2019-03-21 SURGERY — KYPHOPLASTY
Anesthesia: Choice

## 2019-03-21 MED ORDER — LIDOCAINE HCL (CARDIAC) PF 100 MG/5ML IV SOSY
PREFILLED_SYRINGE | INTRAVENOUS | Status: DC | PRN
  Administered 2019-03-21: 50 mg via INTRATRACHEAL

## 2019-03-21 MED ORDER — FENTANYL CITRATE (PF) 100 MCG/2ML IJ SOLN
INTRAMUSCULAR | Status: AC
  Administered 2019-03-21: 15:00:00 25 ug via INTRAVENOUS
  Filled 2019-03-21: qty 2

## 2019-03-21 MED ORDER — CEFAZOLIN SODIUM-DEXTROSE 2-4 GM/100ML-% IV SOLN
INTRAVENOUS | Status: AC
  Filled 2019-03-21: qty 100

## 2019-03-21 MED ORDER — LIDOCAINE HCL (PF) 2 % IJ SOLN
INTRAMUSCULAR | Status: AC
  Filled 2019-03-21: qty 10

## 2019-03-21 MED ORDER — LACTATED RINGERS IV SOLN
INTRAVENOUS | Status: DC
  Administered 2019-03-21: 13:00:00 100 mL/h via INTRAVENOUS

## 2019-03-21 MED ORDER — PROPOFOL 500 MG/50ML IV EMUL
INTRAVENOUS | Status: AC
  Filled 2019-03-21: qty 50

## 2019-03-21 MED ORDER — ONDANSETRON HCL 4 MG/2ML IJ SOLN: 4.0000 mg | Freq: Once | INTRAMUSCULAR | Status: DC | PRN

## 2019-03-21 MED ORDER — FENTANYL CITRATE (PF) 100 MCG/2ML IJ SOLN
INTRAMUSCULAR | Status: AC
  Filled 2019-03-21: qty 2

## 2019-03-21 MED ORDER — BUPIVACAINE-EPINEPHRINE (PF) 0.5% -1:200000 IJ SOLN
INTRAMUSCULAR | Status: DC | PRN
  Administered 2019-03-21: 10 mL

## 2019-03-21 MED ORDER — ONDANSETRON HCL 4 MG PO TABS: 4.0000 mg | ORAL_TABLET | Freq: Four times a day (QID) | ORAL | Status: DC | PRN

## 2019-03-21 MED ORDER — PROPOFOL 500 MG/50ML IV EMUL
INTRAVENOUS | Status: DC | PRN
  Administered 2019-03-21: 120 ug/kg/min via INTRAVENOUS

## 2019-03-21 MED ORDER — HYDROCODONE-ACETAMINOPHEN 7.5-325 MG PO TABS: 1.0000 | ORAL_TABLET | ORAL | Status: DC | PRN

## 2019-03-21 MED ORDER — FENTANYL CITRATE (PF) 100 MCG/2ML IJ SOLN
25.0000 ug | INTRAMUSCULAR | Status: DC | PRN
  Administered 2019-03-21 (×3): 25 ug via INTRAVENOUS

## 2019-03-21 MED ORDER — HYDROCODONE-ACETAMINOPHEN 5-325 MG PO TABS: 1.0000 | ORAL_TABLET | ORAL | Status: DC | PRN

## 2019-03-21 MED ORDER — FENTANYL CITRATE (PF) 100 MCG/2ML IJ SOLN
INTRAMUSCULAR | Status: DC | PRN
  Administered 2019-03-21: 50 ug via INTRAVENOUS

## 2019-03-21 MED ORDER — IOHEXOL 180 MG/ML  SOLN
INTRAMUSCULAR | Status: DC | PRN
  Administered 2019-03-21: 10 mL

## 2019-03-21 MED ORDER — METOCLOPRAMIDE HCL 5 MG/ML IJ SOLN: 5.0000 mg | Freq: Three times a day (TID) | INTRAMUSCULAR | Status: DC | PRN

## 2019-03-21 MED ORDER — CEFAZOLIN SODIUM-DEXTROSE 2-4 GM/100ML-% IV SOLN
2.0000 g | Freq: Once | INTRAVENOUS | Status: AC
  Administered 2019-03-21: 2 g via INTRAVENOUS

## 2019-03-21 MED ORDER — SODIUM CHLORIDE 0.9 % IV SOLN: INTRAVENOUS | Status: DC

## 2019-03-21 MED ORDER — ACETAMINOPHEN 500 MG PO TABS: 500.0000 mg | ORAL_TABLET | Freq: Four times a day (QID) | ORAL | Status: DC

## 2019-03-21 MED ORDER — MIDAZOLAM HCL 2 MG/2ML IJ SOLN
INTRAMUSCULAR | Status: DC | PRN
  Administered 2019-03-21: 1 mg via INTRAVENOUS

## 2019-03-21 MED ORDER — ONDANSETRON HCL 4 MG/2ML IJ SOLN: 4.0000 mg | Freq: Four times a day (QID) | INTRAMUSCULAR | Status: DC | PRN

## 2019-03-21 MED ORDER — ACETAMINOPHEN 325 MG PO TABS: 325.0000 mg | ORAL_TABLET | Freq: Four times a day (QID) | ORAL | Status: DC | PRN

## 2019-03-21 MED ORDER — LIDOCAINE HCL (PF) 1 % IJ SOLN
INTRAMUSCULAR | Status: AC
  Filled 2019-03-21: qty 30

## 2019-03-21 MED ORDER — MORPHINE SULFATE (PF) 4 MG/ML IV SOLN: 0.5000 mg | INTRAVENOUS | Status: DC | PRN

## 2019-03-21 MED ORDER — METOCLOPRAMIDE HCL 10 MG PO TABS: 5.0000 mg | ORAL_TABLET | Freq: Three times a day (TID) | ORAL | Status: DC | PRN

## 2019-03-21 MED ORDER — BUPIVACAINE-EPINEPHRINE (PF) 0.5% -1:200000 IJ SOLN
INTRAMUSCULAR | Status: AC
  Filled 2019-03-21: qty 30

## 2019-03-21 MED ORDER — MIDAZOLAM HCL 2 MG/2ML IJ SOLN
INTRAMUSCULAR | Status: AC
  Filled 2019-03-21: qty 2

## 2019-03-21 SURGICAL SUPPLY — 21 items
ADH SKN CLS APL DERMABOND .7 (GAUZE/BANDAGES/DRESSINGS) ×1
CEMENT KYPHON CX01A KIT/MIXER (Cement) ×3 IMPLANT
COVER WAND RF STERILE (DRAPES) ×3 IMPLANT
DERMABOND ADVANCED (GAUZE/BANDAGES/DRESSINGS) ×2
DERMABOND ADVANCED .7 DNX12 (GAUZE/BANDAGES/DRESSINGS) ×1 IMPLANT
DEVICE BIOPSY BONE KYPHX (INSTRUMENTS) ×3 IMPLANT
DRAPE C-ARM XRAY 36X54 (DRAPES) ×3 IMPLANT
DURAPREP 26ML APPLICATOR (WOUND CARE) ×3 IMPLANT
FEE RENTAL RFA GENERATOR (MISCELLANEOUS) IMPLANT
GLOVE SURG SYN 9.0  PF PI (GLOVE) ×2
GLOVE SURG SYN 9.0 PF PI (GLOVE) ×1 IMPLANT
GOWN SRG 2XL LVL 4 RGLN SLV (GOWNS) ×1 IMPLANT
GOWN STRL NON-REIN 2XL LVL4 (GOWNS) ×3
GOWN STRL REUS W/ TWL LRG LVL3 (GOWN DISPOSABLE) ×1 IMPLANT
GOWN STRL REUS W/TWL LRG LVL3 (GOWN DISPOSABLE) ×3
PACK KYPHOPLASTY (MISCELLANEOUS) ×3 IMPLANT
RENTAL RFA  GENERATOR (MISCELLANEOUS)
RENTAL RFA GENERATOR (MISCELLANEOUS) IMPLANT
STRAP SAFETY 5IN WIDE (MISCELLANEOUS) ×3 IMPLANT
TRAY KYPHOPAK 15/3 EXPRESS 1ST (MISCELLANEOUS) ×1 IMPLANT
TRAY KYPHOPAK 20/3 EXPRESS 1ST (MISCELLANEOUS) ×3 IMPLANT

## 2019-03-21 NOTE — Anesthesia Postprocedure Evaluation (Signed)
Anesthesia Post Note  Patient: Tiffany Mcbride  Procedure(s) Performed: L3 KYPHOPLASTY (N/A )  Patient location during evaluation: PACU Anesthesia Type: MAC Level of consciousness: awake and alert Pain management: pain level controlled Vital Signs Assessment: post-procedure vital signs reviewed and stable Respiratory status: spontaneous breathing, nonlabored ventilation, respiratory function stable and patient connected to nasal cannula oxygen Cardiovascular status: stable and blood pressure returned to baseline Postop Assessment: no apparent nausea or vomiting Anesthetic complications: no Comments: Mild nonparticulate emesis while prone and immediately suctioned.  Sats in the low 90s and transferred to right lateral decub postion asap.  Stable and no increased work of breathing in [employer].  JA     Last Vitals:  Vitals:   03/21/19 1420 03/21/19 1430  BP: 138/81 136/83  Pulse: 78 68  Resp: 18 17  Temp: 36.6 C   SpO2: 95% 99%    Last Pain:  Vitals:   03/21/19 1228  TempSrc: Oral  PainSc: 4     LLE Motor Response: Purposeful movement (03/21/19 1430)   RLE Motor Response: Purposeful movement (03/21/19 1430)        Molli Barrows

## 2019-03-21 NOTE — H&P (Signed)
Reviewed paper H+P, will be scanned into chart. No changes noted.  

## 2019-03-21 NOTE — Discharge Instructions (Addendum)
Remove Band-Aid in 2 days.  Okay to shower after that.  Take it easy today and tomorrow and resume more normal activities on Thursday.  Try not to lift anything over 5 pounds for 2 weeks.  Call office if having problems.   AMBULATORY SURGERY  DISCHARGE INSTRUCTIONS   1) The drugs that you were given will stay in your system until tomorrow so for the next 24 hours you should not:  A) Drive an automobile B) Make any legal decisions C) Drink any alcoholic beverage   2) You may resume regular meals tomorrow.  Today it is better to start with liquids and gradually work up to solid foods.  You may eat anything you prefer, but it is better to start with liquids, then soup and crackers, and gradually work up to solid foods.   3) Please notify your doctor immediately if you have any unusual bleeding, trouble breathing, redness and pain at the surgery site, drainage, fever, or pain not relieved by medication.    4) Additional Instructions:        Please contact your physician with any problems or Same Day Surgery at [phone], Monday through Friday 6 am to 4 pm, or [place] at [facility] number at [phone].

## 2019-03-21 NOTE — Op Note (Signed)
Date March 21, 2019  time 2:16 PM   PATIENT:  Tiffany Mcbride   PRE-OPERATIVE DIAGNOSIS:  closed wedge compression fracture of L3   POST-OPERATIVE DIAGNOSIS:  closed wedge compression fracture of L3   PROCEDURE:  Procedure(s): KYPHOPLASTY L3  SURGEON: Laurene Footman, MD   ASSISTANTS: None   ANESTHESIA:   local and MAC   EBL:  No intake/output data recorded.   BLOOD ADMINISTERED:none   DRAINS: none    LOCAL MEDICATIONS USED:  MARCAINE    and XYLOCAINE    SPECIMEN:   L3 vertebral body biopsy   DISPOSITION OF SPECIMEN:  Pathology   COUNTS:  YES   TOURNIQUET:  * No tourniquets in log *   IMPLANTS: Bone cement   DICTATION: .Dragon Dictation  patient was brought to the operating room and after adequate anesthesia was obtained the patient was placed prone.  C arm was brought in in good visualization of the affected level obtained on both AP and lateral projections.  After patient identification and timeout procedures were completed, local anesthetic was infiltrated with 10 cc 1% Xylocaine infiltrated subcutaneously.  This is done the area on the right side of the planned approach.  The back was then prepped and draped in the usual sterile manner and repeat timeout procedure carried out.  A spinal needle was brought down to the pedicle on the right side of  L3 and a 50-50 mix of 1% Xylocaine half percent Sensorcaine with epinephrine total of 40 cc injected.  After allowing this to set a small incision was made and the trocar was advanced into the vertebral body in an extrapedicular fashion.  Biopsy was obtained Drilling was carried out balloon inserted with inflation to  for cc.  When the cement was appropriate consistency 5 cc were injected into the vertebral body with some extravasation into the L3-3 disc space through a fracture line, good fill superior to inferior endplates and from right to left sides along the inferior endplate.  After the cement had set the trochar was removed and  permanent C-arm views obtained.  The wound was closed with Dermabond followed by Band-Aid   PLAN OF CARE: Discharge to home after PACU   PATIENT DISPOSITION:  PACU - hemodynamically stable.

## 2019-03-21 NOTE — Transfer of Care (Signed)
Immediate Anesthesia Transfer of Care Note  Patient: Tiffany Mcbride  Procedure(s) Performed: L3 KYPHOPLASTY (N/A )  Patient Location: PACU  Anesthesia Type:General  Level of Consciousness: awake, alert  and oriented  Airway & Oxygen Therapy: Patient Spontanous Breathing and Patient connected to nasal cannula oxygen  Post-op Assessment: Report given to RN and Post -op Vital signs reviewed and stable  Post vital signs: Reviewed and stable  Last Vitals:  Vitals Value Taken Time  BP 138/81 03/21/19 1420  Temp 36.6 C 03/21/19 1420  Pulse 63 03/21/19 1429  Resp 20 03/21/19 1429  SpO2 97 % 03/21/19 1429  Vitals shown include unvalidated device data.  Last Pain:  Vitals:   03/21/19 1228  TempSrc: Oral  PainSc: 4       Patients Stated Pain Goal: 1 (28/00/34 9179)  Complications: No apparent anesthesia complications

## 2019-03-21 NOTE — Anesthesia Post-op Follow-up Note (Signed)
Anesthesia QCDR form completed.        

## 2019-03-21 NOTE — Pre-Procedure Instructions (Signed)
Faxed request for H&P. 

## 2019-03-21 NOTE — Anesthesia Preprocedure Evaluation (Signed)
Anesthesia Evaluation  Patient identified by MRN, date of birth, ID band Patient awake    Reviewed: Allergy & Precautions, H&P , NPO status , Patient's Chart, lab work & pertinent test results, reviewed documented beta blocker date and time   Airway Mallampati: II  TM Distance: >3 FB Neck ROM: full    Dental no notable dental hx. (+) Teeth Intact   Pulmonary neg pulmonary ROS,    Pulmonary exam normal breath sounds clear to auscultation       Cardiovascular Exercise Tolerance: Poor hypertension, On Medications + Valvular Problems/Murmurs  Rhythm:regular Rate:Normal     Neuro/Psych PSYCHIATRIC DISORDERS negative neurological ROS     GI/Hepatic Neg liver ROS, GERD  Medicated,  Endo/Other  negative endocrine ROSdiabetes, Well Controlled  Renal/GU      Musculoskeletal   Abdominal   Peds  Hematology negative hematology ROS (+)   Anesthesia Other Findings   Reproductive/Obstetrics negative OB ROS                             Anesthesia Physical Anesthesia Plan  ASA: III  Anesthesia Plan: MAC   Post-op Pain Management:    Induction:   PONV Risk Score and Plan:   Airway Management Planned:   Additional Equipment:   Intra-op Plan:   Post-operative Plan:   Informed Consent: I have reviewed the patients History and Physical, chart, labs and discussed the procedure including the risks, benefits and alternatives for the proposed anesthesia with the patient or authorized representative who has indicated his/her understanding and acceptance.       Plan Discussed with: CRNA  Anesthesia Plan Comments:         Anesthesia Quick Evaluation

## 2019-03-23 LAB — SURGICAL PATHOLOGY

## 2019-05-16 ENCOUNTER — Ambulatory Visit (INDEPENDENT_AMBULATORY_CARE_PROVIDER_SITE_OTHER): Payer: Medicare Other | Admitting: Podiatry

## 2019-05-16 ENCOUNTER — Encounter: Payer: Self-pay | Admitting: Podiatry

## 2019-05-16 ENCOUNTER — Other Ambulatory Visit: Payer: Self-pay

## 2019-05-16 DIAGNOSIS — M79674 Pain in right toe(s): Secondary | ICD-10-CM

## 2019-05-16 DIAGNOSIS — B351 Tinea unguium: Secondary | ICD-10-CM

## 2019-05-16 DIAGNOSIS — M79675 Pain in left toe(s): Secondary | ICD-10-CM

## 2019-05-21 NOTE — Progress Notes (Signed)
   SUBJECTIVE Patient presents to office today complaining of elongated, thickened nails that cause pain while ambulating in shoes. She is unable to trim her own nails. Patient is here for further evaluation and treatment.  Past Medical History:  Diagnosis Date  . Arthritis   . At high risk for falls   . GERD (gastroesophageal reflux disease)   . Glaucoma of both eyes   . Heart murmur    wen she was in her 30's  . History of gastric ulcer   . History of kidney stones   . Hyperlipidemia   . Left ureteral calculus   . Urgency of urination     OBJECTIVE General Patient is awake, alert, and oriented x 3 and in no acute distress. Derm Skin is dry and supple bilateral. Negative open lesions or macerations. Remaining integument unremarkable. Nails are tender, long, thickened and dystrophic with subungual debris, consistent with onychomycosis, 1-5 bilateral. No signs of infection noted. Vasc  DP and PT pedal pulses palpable bilaterally. Temperature gradient within normal limits.  Neuro Epicritic and protective threshold sensation grossly intact bilaterally.  Musculoskeletal Exam No symptomatic pedal deformities noted bilateral. Muscular strength within normal limits.  ASSESSMENT 1. Onychodystrophic nails 1-5 bilateral with hyperkeratosis of nails.  2. Onychomycosis of nail due to dermatophyte bilateral 3. Pain in foot bilateral  PLAN OF CARE 1. Patient evaluated today.  2. Instructed to maintain good pedal hygiene and foot care.  3. Mechanical debridement of nails 1-5 bilaterally performed using a nail nipper. Filed with dremel without incident.  4. Return to clinic in 3 mos.    Brent M. Evans, DPM [facility]  Dr. Brent M. Evans, DPM    [address]                                        [place], [place] [address]                Office [phone]  Fax [phone]     

## 2019-08-15 ENCOUNTER — Ambulatory Visit: Payer: Medicare Other | Admitting: Podiatry

## 2019-08-18 ENCOUNTER — Encounter: Payer: Self-pay | Admitting: Podiatry

## 2019-08-18 ENCOUNTER — Ambulatory Visit (INDEPENDENT_AMBULATORY_CARE_PROVIDER_SITE_OTHER): Payer: Medicare Other | Admitting: Podiatry

## 2019-08-18 ENCOUNTER — Other Ambulatory Visit: Payer: Self-pay

## 2019-08-18 DIAGNOSIS — M79675 Pain in left toe(s): Secondary | ICD-10-CM

## 2019-08-18 DIAGNOSIS — B351 Tinea unguium: Secondary | ICD-10-CM

## 2019-08-18 DIAGNOSIS — M79674 Pain in right toe(s): Secondary | ICD-10-CM

## 2019-08-20 NOTE — Progress Notes (Signed)
   SUBJECTIVE Patient presents to office today complaining of elongated, thickened nails that cause pain while ambulating in shoes. She is unable to trim her own nails. Patient is here for further evaluation and treatment.  Past Medical History:  Diagnosis Date  . Arthritis   . At high risk for falls   . GERD (gastroesophageal reflux disease)   . Glaucoma of both eyes   . Heart murmur    wen she was in her 30's  . History of gastric ulcer   . History of kidney stones   . Hyperlipidemia   . Left ureteral calculus   . Urgency of urination     OBJECTIVE General Patient is awake, alert, and oriented x 3 and in no acute distress. Derm Skin is dry and supple bilateral. Negative open lesions or macerations. Remaining integument unremarkable. Nails are tender, long, thickened and dystrophic with subungual debris, consistent with onychomycosis, 1-5 bilateral. No signs of infection noted. Vasc  DP and PT pedal pulses palpable bilaterally. Temperature gradient within normal limits.  Neuro Epicritic and protective threshold sensation grossly intact bilaterally.  Musculoskeletal Exam No symptomatic pedal deformities noted bilateral. Muscular strength within normal limits.  ASSESSMENT 1. Onychodystrophic nails 1-5 bilateral with hyperkeratosis of nails.  2. Onychomycosis of nail due to dermatophyte bilateral 3. Pain in foot bilateral  PLAN OF CARE 1. Patient evaluated today.  2. Instructed to maintain good pedal hygiene and foot care.  3. Mechanical debridement of nails 1-5 bilaterally performed using a nail nipper. Filed with dremel without incident.  4. Return to clinic in 3 mos.    Felecia Shelling, DPM [facility]  Dr. Felecia Shelling, DPM    [address]                                        [place], [place] [address]                Office [phone]  Fax [phone]

## 2019-11-10 ENCOUNTER — Other Ambulatory Visit: Payer: Self-pay

## 2019-11-10 ENCOUNTER — Ambulatory Visit (INDEPENDENT_AMBULATORY_CARE_PROVIDER_SITE_OTHER): Payer: Medicare Other | Admitting: Podiatry

## 2019-11-10 ENCOUNTER — Encounter: Payer: Self-pay | Admitting: Podiatry

## 2019-11-10 VITALS — Temp 97.5°F

## 2019-11-10 DIAGNOSIS — M79674 Pain in right toe(s): Secondary | ICD-10-CM | POA: Diagnosis not present

## 2019-11-10 DIAGNOSIS — B351 Tinea unguium: Secondary | ICD-10-CM | POA: Diagnosis not present

## 2019-11-10 DIAGNOSIS — M79675 Pain in left toe(s): Secondary | ICD-10-CM

## 2019-11-13 NOTE — Progress Notes (Signed)
   SUBJECTIVE Patient presents to office today complaining of elongated, thickened nails that cause pain while ambulating in shoes. She is unable to trim her own nails. Patient is here for further evaluation and treatment.  Past Medical History:  Diagnosis Date  . Arthritis   . At high risk for falls   . GERD (gastroesophageal reflux disease)   . Glaucoma of both eyes   . Heart murmur    wen she was in her 30's  . History of gastric ulcer   . History of kidney stones   . Hyperlipidemia   . Left ureteral calculus   . Urgency of urination     OBJECTIVE General Patient is awake, alert, and oriented x 3 and in no acute distress. Derm Skin is dry and supple bilateral. Negative open lesions or macerations. Remaining integument unremarkable. Nails are tender, long, thickened and dystrophic with subungual debris, consistent with onychomycosis, 1-5 bilateral. No signs of infection noted. Vasc  DP and PT pedal pulses palpable bilaterally. Temperature gradient within normal limits.  Neuro Epicritic and protective threshold sensation grossly intact bilaterally.  Musculoskeletal Exam No symptomatic pedal deformities noted bilateral. Muscular strength within normal limits.  ASSESSMENT 1. Onychodystrophic nails 1-5 bilateral with hyperkeratosis of nails.  2. Onychomycosis of nail due to dermatophyte bilateral 3. Pain in foot bilateral  PLAN OF CARE 1. Patient evaluated today.  2. Instructed to maintain good pedal hygiene and foot care.  3. Mechanical debridement of nails 1-5 bilaterally performed using a nail nipper. Filed with dremel without incident.  4. Return to clinic in 3 mos.    Felecia Shelling, DPM [facility]  Dr. Felecia Shelling, DPM    [address]                                        [place], [place] [address]                Office [phone]  Fax [phone]

## 2019-11-14 ENCOUNTER — Ambulatory Visit: Payer: Medicare Other | Admitting: Podiatry

## 2019-12-29 ENCOUNTER — Other Ambulatory Visit: Payer: Self-pay

## 2019-12-29 ENCOUNTER — Emergency Department
Admission: EM | Admit: 2019-12-29 | Discharge: 2019-12-29 | Disposition: A | Discharge status: Home / Self Care | Payer: Medicare Other | Attending: Emergency Medicine | Admitting: Emergency Medicine

## 2019-12-29 ENCOUNTER — Encounter: Payer: Self-pay | Admitting: Emergency Medicine

## 2019-12-29 DIAGNOSIS — Z5321 Procedure and treatment not carried out due to patient leaving prior to being seen by health care provider: Secondary | ICD-10-CM | POA: Insufficient documentation

## 2019-12-29 DIAGNOSIS — R531 Weakness: Secondary | ICD-10-CM | POA: Diagnosis not present

## 2019-12-29 LAB — CBC
HCT: 43.1 % (ref 36.0–46.0)
Hemoglobin: 14.5 g/dL (ref 12.0–15.0)
MCH: 31.8 pg (ref 26.0–34.0)
MCHC: 33.6 g/dL (ref 30.0–36.0)
MCV: 94.5 fL (ref 80.0–100.0)
Platelets: 288 10*3/uL (ref 150–400)
RBC: 4.56 MIL/uL (ref 3.87–5.11)
RDW: 13 % (ref 11.5–15.5)
WBC: 10 10*3/uL (ref 4.0–10.5)
nRBC: 0 % (ref 0.0–0.2)

## 2019-12-29 LAB — BASIC METABOLIC PANEL
Anion gap: 11 (ref 5–15)
BUN: 18 mg/dL (ref 8–23)
CO2: 24 mmol/L (ref 22–32)
Calcium: 9.6 mg/dL (ref 8.9–10.3)
Chloride: 102 mmol/L (ref 98–111)
Creatinine, Ser: 0.85 mg/dL (ref 0.44–1.00)
GFR calc Af Amer: >60 mL/min (ref >60)
GFR calc non Af Amer: >60 mL/min (ref >60)
Glucose, Bld: 98 mg/dL (ref 70–99)
Potassium: 3.5 mmol/L (ref 3.5–5.1)
Sodium: 137 mmol/L (ref 135–145)

## 2019-12-29 NOTE — ED Triage Notes (Signed)
Pt in via EMS from [facility] with c/o weakness and dizziness for the past week and a half. 160/90, FSBS 102, temp 98.2. #20g to left hand, positional

## 2019-12-29 NOTE — ED Notes (Signed)
Pt alerted front desk staff that her family was here and she as leaving

## 2019-12-29 NOTE — ED Triage Notes (Signed)
Pt here for intermittent episodes where her legs get weak.  Had syncope one week ago.  Reports the problem is from her hips down.  No pain. No headaches. Speech clear. NAD. VSS.  No fevers.

## 2020-01-04 ENCOUNTER — Other Ambulatory Visit: Payer: Self-pay | Admitting: Internal Medicine

## 2020-01-04 ENCOUNTER — Ambulatory Visit
Admission: RE | Admit: 2020-01-04 | Discharge: 2020-01-04 | Disposition: A | Discharge status: Home / Self Care | Payer: Medicare Other | Source: Ambulatory Visit | Attending: Internal Medicine | Admitting: Internal Medicine

## 2020-01-04 ENCOUNTER — Other Ambulatory Visit: Payer: Self-pay

## 2020-01-04 DIAGNOSIS — Y929 Unspecified place or not applicable: Secondary | ICD-10-CM | POA: Diagnosis not present

## 2020-01-04 DIAGNOSIS — R519 Headache, unspecified: Secondary | ICD-10-CM | POA: Diagnosis not present

## 2020-01-04 DIAGNOSIS — Y939 Activity, unspecified: Secondary | ICD-10-CM | POA: Diagnosis not present

## 2020-01-04 DIAGNOSIS — W19XXXA Unspecified fall, initial encounter: Secondary | ICD-10-CM

## 2020-01-04 DIAGNOSIS — M542 Cervicalgia: Secondary | ICD-10-CM | POA: Diagnosis not present

## 2020-01-04 DIAGNOSIS — S0990XA Unspecified injury of head, initial encounter: Secondary | ICD-10-CM | POA: Insufficient documentation

## 2020-01-04 DIAGNOSIS — Y999 Unspecified external cause status: Secondary | ICD-10-CM | POA: Diagnosis not present

## 2020-02-09 ENCOUNTER — Ambulatory Visit: Payer: Medicare Other | Admitting: Podiatry

## 2020-02-20 ENCOUNTER — Ambulatory Visit: Payer: Medicare Other | Admitting: Podiatry

## 2020-03-04 ENCOUNTER — Other Ambulatory Visit: Payer: Self-pay

## 2020-03-04 ENCOUNTER — Ambulatory Visit (INDEPENDENT_AMBULATORY_CARE_PROVIDER_SITE_OTHER): Payer: Medicare Other | Admitting: Podiatry

## 2020-03-04 ENCOUNTER — Encounter: Payer: Self-pay | Admitting: Podiatry

## 2020-03-04 DIAGNOSIS — B351 Tinea unguium: Secondary | ICD-10-CM | POA: Diagnosis not present

## 2020-03-04 DIAGNOSIS — M79675 Pain in left toe(s): Secondary | ICD-10-CM

## 2020-03-04 DIAGNOSIS — N289 Disorder of kidney and ureter, unspecified: Secondary | ICD-10-CM

## 2020-03-04 DIAGNOSIS — M79674 Pain in right toe(s): Secondary | ICD-10-CM

## 2020-03-04 NOTE — Progress Notes (Signed)
This patient returns to my office for at risk foot care.  This patient requires this care by a professional since this patient will be at risk due to having mild renal insufficiency.  This patient is unable to cut nails herself since the patient cannot reach her nails.These nails are painful walking and wearing shoes.  This patient presents for at risk foot care today.  General Appearance  Alert, conversant and in no acute stress.  Vascular  Dorsalis pedis and posterior tibial  pulses are palpable  bilaterally.  Capillary return is within normal limits  bilaterally. Temperature is within normal limits  bilaterally.  Neurologic  Senn-Weinstein monofilament wire test within normal limits  bilaterally. Muscle power within normal limits bilaterally.  Nails Thick disfigured discolored nails with subungual debris  from hallux to fifth toes bilaterally. No evidence of bacterial infection or drainage bilaterally.  Orthopedic  No limitations of motion  feet .  No crepitus or effusions noted.  No bony pathology or digital deformities noted.  Skin  normotropic skin with no porokeratosis noted bilaterally.  No signs of infections or ulcers noted.     Onychomycosis  Pain in right toes  Pain in left toes  Consent was obtained for treatment procedures.   Mechanical debridement of nails 1-5  bilaterally performed with a nail nipper.  Filed with dremel without incident.    Return office visit    3 months                  Told patient to return for periodic foot care and evaluation due to potential at risk complications.   Helane Gunther DPM

## 2020-04-09 ENCOUNTER — Ambulatory Visit
Admission: RE | Admit: 2020-04-09 | Discharge: 2020-04-09 | Disposition: A | Discharge status: Home / Self Care | Payer: Medicare Other | Source: Ambulatory Visit | Attending: Internal Medicine | Admitting: Internal Medicine

## 2020-04-09 ENCOUNTER — Other Ambulatory Visit: Payer: Self-pay | Admitting: Internal Medicine

## 2020-04-09 DIAGNOSIS — R296 Repeated falls: Secondary | ICD-10-CM | POA: Diagnosis present

## 2020-04-09 DIAGNOSIS — G44311 Acute post-traumatic headache, intractable: Secondary | ICD-10-CM

## 2020-04-18 ENCOUNTER — Other Ambulatory Visit (HOSPITAL_COMMUNITY): Payer: Self-pay | Admitting: Internal Medicine

## 2020-04-18 ENCOUNTER — Other Ambulatory Visit: Payer: Self-pay | Admitting: Internal Medicine

## 2020-04-18 DIAGNOSIS — M5442 Lumbago with sciatica, left side: Secondary | ICD-10-CM

## 2020-05-07 ENCOUNTER — Ambulatory Visit
Admission: RE | Admit: 2020-05-07 | Discharge: 2020-05-07 | Disposition: A | Discharge status: Home / Self Care | Payer: Medicare Other | Source: Ambulatory Visit | Attending: Internal Medicine | Admitting: Internal Medicine

## 2020-05-07 ENCOUNTER — Other Ambulatory Visit: Payer: Self-pay

## 2020-05-07 DIAGNOSIS — M5442 Lumbago with sciatica, left side: Secondary | ICD-10-CM | POA: Diagnosis present

## 2020-05-07 DIAGNOSIS — M5441 Lumbago with sciatica, right side: Secondary | ICD-10-CM | POA: Diagnosis present

## 2020-06-04 ENCOUNTER — Ambulatory Visit (INDEPENDENT_AMBULATORY_CARE_PROVIDER_SITE_OTHER): Payer: Medicare Other | Admitting: Podiatry

## 2020-06-04 ENCOUNTER — Other Ambulatory Visit: Payer: Self-pay

## 2020-06-04 DIAGNOSIS — M79674 Pain in right toe(s): Secondary | ICD-10-CM

## 2020-06-04 DIAGNOSIS — M79675 Pain in left toe(s): Secondary | ICD-10-CM

## 2020-06-04 DIAGNOSIS — L989 Disorder of the skin and subcutaneous tissue, unspecified: Secondary | ICD-10-CM

## 2020-06-04 DIAGNOSIS — B351 Tinea unguium: Secondary | ICD-10-CM

## 2020-06-04 NOTE — Progress Notes (Signed)
   SUBJECTIVE Patient presents to office today complaining of elongated, thickened nails that cause pain while ambulating in shoes. She is unable to trim her own nails. Patient is here for further evaluation and treatment.  Past Medical History:  Diagnosis Date  . Arthritis   . At high risk for falls   . GERD (gastroesophageal reflux disease)   . Glaucoma of both eyes   . Heart murmur    wen she was in her 30's  . History of gastric ulcer   . History of kidney stones   . Hyperlipidemia   . Left ureteral calculus   . Urgency of urination     OBJECTIVE General Patient is awake, alert, and oriented x 3 and in no acute distress. Derm Skin is dry and supple bilateral. Negative open lesions or macerations. Remaining integument unremarkable. Nails are tender, long, thickened and dystrophic with subungual debris, consistent with onychomycosis, 1-5 bilateral. No signs of infection noted.  Hyperkeratotic preulcerative callus tissue also noted to the distal tips of the toes bilateral Vasc  DP and PT pedal pulses palpable bilaterally. Temperature gradient within normal limits.  Neuro Epicritic and protective threshold sensation grossly intact bilaterally.  Musculoskeletal Exam No symptomatic pedal deformities noted bilateral. Muscular strength within normal limits.  ASSESSMENT 1. Onychodystrophic nails 1-5 bilateral with hyperkeratosis of nails.  2. Onychomycosis of nail due to dermatophyte bilateral 3. Pain in foot bilateral 4. Preulcerative callus tissue bilateral feet  PLAN OF CARE 1. Patient evaluated today.  2. Instructed to maintain good pedal hygiene and foot care.  3. Mechanical debridement of nails 1-5 bilaterally performed using a nail nipper. Filed with dremel without incident.  4.  Excisional debridement of the hyperkeratotic preulcerative callus tissue was performed using a tissue nipper without incident or bleeding  5.  Return to clinic in 3 mos.    Lissy Deuser M. Sherard Sutch,  DPM [facility]  Dr. Gatsby Chismar M. Latia Mataya, DPM    [address]                                     [place], [place] [address]                Office [phone]  Fax [phone]     

## 2020-08-27 ENCOUNTER — Other Ambulatory Visit: Payer: Self-pay

## 2020-08-27 ENCOUNTER — Ambulatory Visit (INDEPENDENT_AMBULATORY_CARE_PROVIDER_SITE_OTHER): Payer: Medicare Other | Admitting: Podiatry

## 2020-08-27 DIAGNOSIS — M79674 Pain in right toe(s): Secondary | ICD-10-CM | POA: Diagnosis not present

## 2020-08-27 DIAGNOSIS — M79675 Pain in left toe(s): Secondary | ICD-10-CM

## 2020-08-27 DIAGNOSIS — L989 Disorder of the skin and subcutaneous tissue, unspecified: Secondary | ICD-10-CM

## 2020-08-27 DIAGNOSIS — B351 Tinea unguium: Secondary | ICD-10-CM

## 2020-08-27 NOTE — Progress Notes (Signed)
   SUBJECTIVE Patient presents to office today complaining of elongated, thickened nails that cause pain while ambulating in shoes. She is unable to trim her own nails. Patient is here for further evaluation and treatment.  Past Medical History:  Diagnosis Date  . Arthritis   . At high risk for falls   . GERD (gastroesophageal reflux disease)   . Glaucoma of both eyes   . Heart murmur    wen she was in her 30's  . History of gastric ulcer   . History of kidney stones   . Hyperlipidemia   . Left ureteral calculus   . Urgency of urination     OBJECTIVE General Patient is awake, alert, and oriented x 3 and in no acute distress. Derm Skin is dry and supple bilateral. Negative open lesions or macerations. Remaining integument unremarkable. Nails are tender, long, thickened and dystrophic with subungual debris, consistent with onychomycosis, 1-5 bilateral. No signs of infection noted.  Hyperkeratotic preulcerative callus tissue also noted to the distal tips of the toes bilateral Vasc  DP and PT pedal pulses palpable bilaterally. Temperature gradient within normal limits.  Neuro Epicritic and protective threshold sensation grossly intact bilaterally.  Musculoskeletal Exam No symptomatic pedal deformities noted bilateral. Muscular strength within normal limits.  ASSESSMENT 1. Onychodystrophic nails 1-5 bilateral with hyperkeratosis of nails.  2. Onychomycosis of nail due to dermatophyte bilateral 3. Pain in foot bilateral 4. Preulcerative callus tissue bilateral feet  PLAN OF CARE 1. Patient evaluated today.  2. Instructed to maintain good pedal hygiene and foot care.  3. Mechanical debridement of nails 1-5 bilaterally performed using a nail nipper. Filed with dremel without incident.  4.  Excisional debridement of the hyperkeratotic preulcerative callus tissue was performed using a tissue nipper without incident or bleeding  5.  Return to clinic in 3 mos.    Felecia Shelling,  DPM [facility]  Dr. Felecia Shelling, DPM    [address] [place], [place] [address]                Office [phone]  Fax [phone]

## 2020-11-26 ENCOUNTER — Ambulatory Visit (INDEPENDENT_AMBULATORY_CARE_PROVIDER_SITE_OTHER): Payer: Medicare Other | Admitting: Podiatry

## 2020-11-26 ENCOUNTER — Other Ambulatory Visit: Payer: Self-pay

## 2020-11-26 DIAGNOSIS — M79675 Pain in left toe(s): Secondary | ICD-10-CM | POA: Diagnosis not present

## 2020-11-26 DIAGNOSIS — B351 Tinea unguium: Secondary | ICD-10-CM | POA: Diagnosis not present

## 2020-11-26 DIAGNOSIS — M79674 Pain in right toe(s): Secondary | ICD-10-CM | POA: Diagnosis not present

## 2020-11-26 NOTE — Progress Notes (Signed)
   SUBJECTIVE Patient presents to office today complaining of elongated, thickened nails that cause pain while ambulating in shoes. She is unable to trim her own nails. Patient is here for further evaluation and treatment.  Past Medical History:  Diagnosis Date  . Arthritis   . At high risk for falls   . GERD (gastroesophageal reflux disease)   . Glaucoma of both eyes   . Heart murmur    wen she was in her 30's  . History of gastric ulcer   . History of kidney stones   . Hyperlipidemia   . Left ureteral calculus   . Urgency of urination     OBJECTIVE General Patient is awake, alert, and oriented x 3 and in no acute distress. Derm Skin is dry and supple bilateral. Negative open lesions or macerations. Remaining integument unremarkable. Nails are tender, long, thickened and dystrophic with subungual debris, consistent with onychomycosis, 1-5 bilateral. No signs of infection noted.  Hyperkeratotic preulcerative callus tissue also noted to the distal tips of the toes bilateral Vasc  DP and PT pedal pulses palpable bilaterally. Temperature gradient within normal limits.  Neuro Epicritic and protective threshold sensation grossly intact bilaterally.  Musculoskeletal Exam No symptomatic pedal deformities noted bilateral. Muscular strength within normal limits.  ASSESSMENT 1. Onychodystrophic nails 1-5 bilateral with hyperkeratosis of nails.  2. Onychomycosis of nail due to dermatophyte bilateral 3. Pain in foot bilateral 4. Preulcerative callus tissue bilateral feet  PLAN OF CARE 1. Patient evaluated today.  2. Instructed to maintain good pedal hygiene and foot care.  3. Mechanical debridement of nails 1-5 bilaterally performed using a nail nipper. Filed with dremel without incident.  4.  Excisional debridement of the hyperkeratotic preulcerative callus tissue was performed using a tissue nipper without incident or bleeding  5.  Return to clinic in 3 mos.    Felecia Shelling,  DPM [facility]  Dr. Felecia Shelling, DPM    [address] [place], [place] [address]                Office [phone]  Fax [phone]

## 2021-01-31 DIAGNOSIS — G459 Transient cerebral ischemic attack, unspecified: Secondary | ICD-10-CM

## 2021-01-31 HISTORY — DX: Transient cerebral ischemic attack, unspecified: G45.9

## 2021-02-25 ENCOUNTER — Ambulatory Visit (INDEPENDENT_AMBULATORY_CARE_PROVIDER_SITE_OTHER): Payer: Medicare Other | Admitting: Podiatry

## 2021-02-25 ENCOUNTER — Other Ambulatory Visit: Payer: Self-pay

## 2021-02-25 DIAGNOSIS — M659 Synovitis and tenosynovitis, unspecified: Secondary | ICD-10-CM | POA: Diagnosis not present

## 2021-02-25 DIAGNOSIS — M79674 Pain in right toe(s): Secondary | ICD-10-CM

## 2021-02-25 DIAGNOSIS — L989 Disorder of the skin and subcutaneous tissue, unspecified: Secondary | ICD-10-CM

## 2021-02-25 DIAGNOSIS — B351 Tinea unguium: Secondary | ICD-10-CM

## 2021-02-25 DIAGNOSIS — M79675 Pain in left toe(s): Secondary | ICD-10-CM

## 2021-02-25 MED ORDER — BETAMETHASONE SOD PHOS & ACET 6 (3-3) MG/ML IJ SUSP: 3.0000 mg | Freq: Once | INTRAMUSCULAR | Status: DC

## 2021-02-25 NOTE — Progress Notes (Signed)
   SUBJECTIVE Patient presents to office today complaining of elongated, thickened nails that cause pain while ambulating in shoes. She is unable to trim her own nails.   Patient also states that most recently she has had some pain and tenderness to her left ankle joint.  She denies a history of injury.  She has not done anything for treatment.  Patient is here for further evaluation and treatment.  Past Medical History:  Diagnosis Date   Arthritis    At high risk for falls    GERD (gastroesophageal reflux disease)    Glaucoma of both eyes    Heart murmur    wen she was in her 30's   History of gastric ulcer    History of kidney stones    Hyperlipidemia    Left ureteral calculus    Urgency of urination     OBJECTIVE General Patient is awake, alert, and oriented x 3 and in no acute distress. Derm Skin is dry and supple bilateral. Negative open lesions or macerations. Remaining integument unremarkable. Nails are tender, long, thickened and dystrophic with subungual debris, consistent with onychomycosis, 1-5 bilateral. No signs of infection noted.  Hyperkeratotic preulcerative callus tissue also noted to the distal tips of the toes bilateral Vasc  DP and PT pedal pulses palpable bilaterally. Temperature gradient within normal limits.  Neuro Epicritic and protective threshold sensation grossly intact bilaterally.  Musculoskeletal Exam No symptomatic pedal deformities noted bilateral. Muscular strength within normal limits.  There is some pain on palpation to the lateral aspect of the left ankle joint today  ASSESSMENT 1. Onychodystrophic nails 1-5 bilateral with hyperkeratosis of nails.  2. Onychomycosis of nail due to dermatophyte bilateral 3. Pain in foot bilateral 4. Preulcerative callus tissue bilateral feet 5.  Synovitis left ankle; lateral aspect  PLAN OF CARE 1. Patient evaluated today.  2. Instructed to maintain good pedal hygiene and foot care.  3. Mechanical debridement of  nails 1-5 bilaterally performed using a nail nipper. Filed with dremel without incident.  4.  Excisional debridement of the hyperkeratotic preulcerative callus tissue was performed using a tissue nipper without incident or bleeding  5.  Injection of 0.5 cc Celestone Soluspan injection of the left ankle joint  6.  Return to clinic in 3 mos.    Felecia Shelling, DPM [facility]  Dr. Felecia Shelling, DPM    [address] [place], [place] [address]                Office [phone]  Fax [phone]

## 2021-02-27 ENCOUNTER — Observation Stay: Payer: Medicare Other

## 2021-02-27 ENCOUNTER — Encounter: Payer: Self-pay | Admitting: Internal Medicine

## 2021-02-27 ENCOUNTER — Emergency Department: Payer: Medicare Other

## 2021-02-27 ENCOUNTER — Other Ambulatory Visit: Payer: Self-pay

## 2021-02-27 ENCOUNTER — Observation Stay
Admission: EM | Admit: 2021-02-27 | Discharge: 2021-03-01 | Disposition: A | Discharge status: Home / Self Care | Payer: Medicare Other | Attending: Internal Medicine | Admitting: Internal Medicine

## 2021-02-27 DIAGNOSIS — Z882 Allergy status to sulfonamides status: Secondary | ICD-10-CM | POA: Insufficient documentation

## 2021-02-27 DIAGNOSIS — Z20822 Contact with and (suspected) exposure to covid-19: Secondary | ICD-10-CM | POA: Insufficient documentation

## 2021-02-27 DIAGNOSIS — I10 Essential (primary) hypertension: Secondary | ICD-10-CM | POA: Diagnosis not present

## 2021-02-27 DIAGNOSIS — Y900 Blood alcohol level of less than 20 mg/100 ml: Secondary | ICD-10-CM | POA: Insufficient documentation

## 2021-02-27 DIAGNOSIS — R42 Dizziness and giddiness: Secondary | ICD-10-CM | POA: Diagnosis present

## 2021-02-27 DIAGNOSIS — Z885 Allergy status to narcotic agent status: Secondary | ICD-10-CM | POA: Diagnosis not present

## 2021-02-27 DIAGNOSIS — H53462 Homonymous bilateral field defects, left side: Secondary | ICD-10-CM

## 2021-02-27 DIAGNOSIS — Z79899 Other long term (current) drug therapy: Secondary | ICD-10-CM | POA: Insufficient documentation

## 2021-02-27 DIAGNOSIS — E538 Deficiency of other specified B group vitamins: Secondary | ICD-10-CM

## 2021-02-27 DIAGNOSIS — D72829 Elevated white blood cell count, unspecified: Secondary | ICD-10-CM | POA: Diagnosis present

## 2021-02-27 DIAGNOSIS — Z888 Allergy status to other drugs, medicaments and biological substances status: Secondary | ICD-10-CM | POA: Insufficient documentation

## 2021-02-27 DIAGNOSIS — E785 Hyperlipidemia, unspecified: Secondary | ICD-10-CM | POA: Diagnosis not present

## 2021-02-27 DIAGNOSIS — Z8781 Personal history of (healed) traumatic fracture: Secondary | ICD-10-CM | POA: Diagnosis not present

## 2021-02-27 DIAGNOSIS — I639 Cerebral infarction, unspecified: Principal | ICD-10-CM | POA: Diagnosis present

## 2021-02-27 DIAGNOSIS — R0602 Shortness of breath: Secondary | ICD-10-CM

## 2021-02-27 DIAGNOSIS — E782 Mixed hyperlipidemia: Secondary | ICD-10-CM

## 2021-02-27 DIAGNOSIS — Z8249 Family history of ischemic heart disease and other diseases of the circulatory system: Secondary | ICD-10-CM | POA: Insufficient documentation

## 2021-02-27 DIAGNOSIS — I6389 Other cerebral infarction: Secondary | ICD-10-CM

## 2021-02-27 LAB — URINALYSIS, ROUTINE W REFLEX MICROSCOPIC
Bacteria, UA: NONE SEEN
Bilirubin Urine: NEGATIVE
Glucose, UA: NEGATIVE mg/dL
Hgb urine dipstick: NEGATIVE
Ketones, ur: NEGATIVE mg/dL
Nitrite: NEGATIVE
Protein, ur: NEGATIVE mg/dL
Specific Gravity, Urine: 1.008 (ref 1.005–1.030)
pH: 5 (ref 5.0–8.0)

## 2021-02-27 LAB — CBC WITH DIFFERENTIAL/PLATELET
Abs Immature Granulocytes: 0.08 10*3/uL — ABNORMAL HIGH (ref 0.00–0.07)
Basophils Absolute: 0.1 10*3/uL (ref 0.0–0.1)
Basophils Relative: 0 %
Eosinophils Absolute: 0 10*3/uL (ref 0.0–0.5)
Eosinophils Relative: 0 %
HCT: 45 % (ref 36.0–46.0)
Hemoglobin: 14.9 g/dL (ref 12.0–15.0)
Immature Granulocytes: 1 %
Lymphocytes Relative: 31 %
Lymphs Abs: 3.9 10*3/uL (ref 0.7–4.0)
MCH: 32.7 pg (ref 26.0–34.0)
MCHC: 33.1 g/dL (ref 30.0–36.0)
MCV: 98.9 fL (ref 80.0–100.0)
Monocytes Absolute: 0.7 10*3/uL (ref 0.1–1.0)
Monocytes Relative: 6 %
Neutro Abs: 7.9 10*3/uL — ABNORMAL HIGH (ref 1.7–7.7)
Neutrophils Relative %: 62 %
Platelets: 330 10*3/uL (ref 150–400)
RBC: 4.55 MIL/uL (ref 3.87–5.11)
RDW: 13.3 % (ref 11.5–15.5)
WBC: 12.7 10*3/uL — ABNORMAL HIGH (ref 4.0–10.5)
nRBC: 0 % (ref 0.0–0.2)

## 2021-02-27 LAB — BASIC METABOLIC PANEL
Anion gap: 7 (ref 5–15)
BUN: 22 mg/dL (ref 8–23)
CO2: 26 mmol/L (ref 22–32)
Calcium: 9.5 mg/dL (ref 8.9–10.3)
Chloride: 105 mmol/L (ref 98–111)
Creatinine, Ser: 0.84 mg/dL (ref 0.44–1.00)
GFR, Estimated: >60 mL/min (ref >60)
Glucose, Bld: 105 mg/dL — ABNORMAL HIGH (ref 70–99)
Potassium: 3.8 mmol/L (ref 3.5–5.1)
Sodium: 138 mmol/L (ref 135–145)

## 2021-02-27 LAB — URINE DRUG SCREEN, QUALITATIVE (ARMC ONLY)
Amphetamines, Ur Screen: NOT DETECTED
Barbiturates, Ur Screen: NOT DETECTED
Benzodiazepine, Ur Scrn: NOT DETECTED
Cannabinoid 50 Ng, Ur ~~LOC~~: NOT DETECTED
Cocaine Metabolite,Ur ~~LOC~~: NOT DETECTED
MDMA (Ecstasy)Ur Screen: NOT DETECTED
Methadone Scn, Ur: NOT DETECTED
Opiate, Ur Screen: NOT DETECTED
Phencyclidine (PCP) Ur S: NOT DETECTED
Tricyclic, Ur Screen: NOT DETECTED

## 2021-02-27 LAB — URINALYSIS, COMPLETE (UACMP) WITH MICROSCOPIC
Bacteria, UA: NONE SEEN
Bilirubin Urine: NEGATIVE
Glucose, UA: NEGATIVE mg/dL
Hgb urine dipstick: NEGATIVE
Ketones, ur: NEGATIVE mg/dL
Nitrite: NEGATIVE
Protein, ur: NEGATIVE mg/dL
Specific Gravity, Urine: 1.019 (ref 1.005–1.030)
pH: 5 (ref 5.0–8.0)

## 2021-02-27 LAB — TROPONIN I (HIGH SENSITIVITY)
Troponin I (High Sensitivity): 9 ng/L (ref <18)
Troponin I (High Sensitivity): 9 ng/L

## 2021-02-27 LAB — PROTIME-INR
INR: 1 (ref 0.8–1.2)
Prothrombin Time: 12.6 seconds (ref 11.4–15.2)

## 2021-02-27 LAB — RESP PANEL BY RT-PCR (FLU A&B, COVID) ARPGX2
Influenza A by PCR: NEGATIVE
Influenza B by PCR: NEGATIVE
SARS Coronavirus 2 by RT PCR: NEGATIVE

## 2021-02-27 LAB — APTT: aPTT: 24 seconds (ref 24–36)

## 2021-02-27 LAB — ETHANOL: Alcohol, Ethyl (B): <10 mg/dL (ref <10)

## 2021-02-27 LAB — VITAMIN B12: Vitamin B-12: 1139 pg/mL — ABNORMAL HIGH (ref 180–914)

## 2021-02-27 MED ORDER — STROKE: EARLY STAGES OF RECOVERY BOOK: Freq: Once | Status: DC

## 2021-02-27 MED ORDER — HYDRALAZINE HCL 20 MG/ML IJ SOLN: 5.0000 mg | Freq: Four times a day (QID) | INTRAMUSCULAR | Status: DC | PRN

## 2021-02-27 MED ORDER — LOSARTAN POTASSIUM 25 MG PO TABS
25.0000 mg | ORAL_TABLET | Freq: Every day | ORAL | Status: DC
  Administered 2021-02-28: 08:00:00 25 mg via ORAL
  Filled 2021-02-27: qty 1

## 2021-02-27 MED ORDER — ASPIRIN 81 MG PO CHEW
324.0000 mg | CHEWABLE_TABLET | Freq: Once | ORAL | Status: AC
  Administered 2021-02-27: 324 mg via ORAL
  Filled 2021-02-27: qty 4

## 2021-02-27 MED ORDER — ACETAMINOPHEN 650 MG RE SUPP: 650.0000 mg | RECTAL | Status: DC | PRN

## 2021-02-27 MED ORDER — HYDRALAZINE HCL 50 MG PO TABS
25.0000 mg | ORAL_TABLET | Freq: Once | ORAL | Status: DC
  Filled 2021-02-27: qty 1

## 2021-02-27 MED ORDER — ACETAMINOPHEN 325 MG PO TABS
650.0000 mg | ORAL_TABLET | ORAL | Status: DC | PRN
  Administered 2021-02-27 – 2021-02-28 (×2): 650 mg via ORAL
  Filled 2021-02-27 (×2): qty 2

## 2021-02-27 MED ORDER — ENOXAPARIN SODIUM 40 MG/0.4ML IJ SOSY
40.0000 mg | PREFILLED_SYRINGE | INTRAMUSCULAR | Status: DC
  Administered 2021-02-27: 23:00:00 40 mg via SUBCUTANEOUS
  Filled 2021-02-27: qty 0.4

## 2021-02-27 MED ORDER — ASPIRIN EC 81 MG PO TBEC
81.0000 mg | DELAYED_RELEASE_TABLET | Freq: Every day | ORAL | Status: DC
  Administered 2021-02-28 – 2021-03-01 (×2): 81 mg via ORAL
  Filled 2021-02-27 (×2): qty 1

## 2021-02-27 MED ORDER — ACETAMINOPHEN 160 MG/5ML PO SOLN
650.0000 mg | ORAL | Status: DC | PRN
  Filled 2021-02-27: qty 20.3

## 2021-02-27 NOTE — ED Triage Notes (Signed)
Pt presents to the [facility] from home with c/o dizziness, blurred vision, nausea and headache that has been ongoing for 3 days. Pt denies unilateral weakness, facial droop, or slurred speech. Pt currently A&Ox4 with stable vital signs.

## 2021-02-27 NOTE — ED Provider Notes (Signed)
[facility] Emergency Department Provider Note ____________________________________________   Event Date/Time   First MD Initiated Contact with Patient 02/27/21 1617     (approximate)  I have reviewed the triage vital signs and the nursing notes.  HISTORY  Chief Complaint Dizziness, Headache, and Blurred Vision   HPI Tiffany BameLinda E Mcbride is a 80 y.o. femalewho presents to the ED for evaluation of dizziness and headache.   Chart review indicates hx obesity, HLD. Patient reports that she resides at a local senior citizen apartment complex and lives independently.  Ambulatory with a cane.  Patient presents to the ED for evaluation of about a week of intermittent headaches, and primarily with reports of 2-3 days of vision loss, new dizziness and vertigo.  She reports that she does wear glasses, and she has them with her today, but despite them her vision has gotten drastically worse over the past 2-3 days.  She reports blurry vision without floaters, ocular pain or trauma.  She further reports a vertiginous sensation that is positional and new as she has never felt this before.  She reports a swimming and moving sensation when she moves her head to the right.  Further reports intermittent headaches over the past 1 week that are aching and generalized.  She reports her most recent fall was back in the spring, about 4 months ago and she has had no recent trauma since that time.  She denies fevers, syncopal episodes or recent illnesses.  Past Medical History:  Diagnosis Date   Arthritis    At high risk for falls    GERD (gastroesophageal reflux disease)    Glaucoma of both eyes    Heart murmur    wen she was in her 30's   History of gastric ulcer    History of kidney stones    Hyperlipidemia    Left ureteral calculus    Urgency of urination     Patient Active Problem List   Diagnosis Date Noted   Stroke (HCC) 02/27/2021   History of compression fracture of  spine 12/10/2016   Mild renal insufficiency 12/10/2016   Postmenopausal 12/10/2016   Pure hypercholesterolemia 12/10/2016   Compression fracture of thoracic vertebra (HCC) 07/16/2016   Low vitamin B12 level 11/28/2014   B12 deficiency 11/28/2013   Hip pain, chronic 03/06/2012   Obesity (BMI 30-39.9) 03/06/2012   Postmenopausal atrophic vaginitis 01/26/2012   Screening for cervical cancer 01/26/2012   Screening for colon cancer 01/26/2012   Malaise 01/26/2012   Glaucoma    Osteoarthritis    DYSLIPIDEMIA 04/11/2007   GERD 04/11/2007   DEPRESSION, HX OF 04/11/2007    Past Surgical History:  Procedure Laterality Date   BACK SURGERY  07/2016   kyphoplasty   CERVICAL SPINE SURGERY  1992   posterior diskectomy   COLONOSCOPY     CYSTOSCOPY WITH RETROGRADE PYELOGRAM, URETEROSCOPY AND STENT PLACEMENT Left 03/09/2013   Procedure: CYSTOSCOPY WITH RETROGRADE PYELOGRAM, URETEROSCOPY AND LEFT STENT PLACEMENT;  Surgeon: Sebastian Acheheodore Manny, MD;  Location: [facility] [place];  Service: Urology;  Laterality: Left;   ENDOMETRIAL ABLATION     EYE SURGERY Bilateral    cataract extractions   KYPHOPLASTY N/A 07/23/2016   Procedure: KYPHOPLASTY T11;  Surgeon: Kennedy BuckerMichael Menz, MD;  Location: [facility];  Service: Orthopedics;  Laterality: N/A;   KYPHOPLASTY N/A 03/21/2019   Procedure: L3 KYPHOPLASTY;  Surgeon: Kennedy BuckerMenz, Michael, MD;  Location: [facility];  Service: Orthopedics;  Laterality: N/A;   LAPAROSCOPIC CHOLECYSTECTOMY  1998  PUBOVAGINAL SLING  1995   TUBAL LIGATION     UPPER GI ENDOSCOPY      Prior to Admission medications   Medication Sig Start Date End Date Taking? Authorizing Provider  Calcium Carb-Cholecalciferol (CALCIUM 600 + D PO) Take 2 tablets by mouth daily.    [provider]  Doxylamine Succinate, Sleep, (SLEEP AID PO) Take 2 tablets by mouth at bedtime as needed (sleep).    [provider]  FINACEA 15 % cream Apply 1 application topically 2 (two) times daily.   07/01/16   [provider]  Flaxseed, Linseed, (BIO-FLAX) 1000 MG CAPS Take 1,000 mg by mouth daily.    [provider]  latanoprost (XALATAN) 0.005 % ophthalmic solution Place 1 drop into both eyes at bedtime. 07/18/16   [provider]  magnesium citrate SOLN Take 1 Bottle by mouth as needed for severe constipation.    [provider]  mirabegron ER (MYRBETRIQ) 25 MG TB24 tablet Take 25 mg by mouth daily.  12/14/17   [provider]  naproxen sodium (ALEVE) 220 MG tablet Take 220 mg by mouth 2 (two) times daily as needed (pain).    [provider]  NEXIUM 40 MG capsule TAKE ONE CAPSULE BY MOUTH ONCE A DAY BEFORE BREAKFAST Patient taking differently: Take 40 mg by mouth daily.  05/19/13   Sherlene Shams, MD  senna-docusate (SENOKOT-S) 8.6-50 MG per tablet Take 1 tablet by mouth 2 (two) times daily. While taking pain meds to prevent contipation Patient taking differently: Take 1 tablet by mouth daily as needed for mild constipation. While taking pain meds to prevent contipation 03/09/13   Sebastian Ache, MD  timolol (TIMOPTIC) 0.5 % ophthalmic solution Place 1 drop into both eyes 2 (two) times daily. 06/30/16   [provider]  vitamin B-12 (CYANOCOBALAMIN) 500 MCG tablet Take 500 mcg by mouth daily.    [provider]    Allergies Codeine, Meloxicam, and Sulfa antibiotics  Family History  Problem Relation Age of Onset   Hypertension Mother    Heart disease Father    Cancer Sister        metastatic liver Ca   Heart disease Brother    Cancer Brother        Lung    Social History Social History   Tobacco Use   Smoking status: Never   Smokeless tobacco: Never  Vaping Use   Vaping Use: Never used  Substance Use Topics   Alcohol use: No   Drug use: No    Review of Systems  Constitutional: No fever/chills Eyes: No visual changes. ENT: No sore throat. Cardiovascular: Denies chest pain. Respiratory: Denies  shortness of breath. Gastrointestinal: No abdominal pain.  No nausea, no vomiting.  No diarrhea.  No constipation. Genitourinary: Negative for dysuria. Musculoskeletal: Negative for back pain. Skin: Negative for rash. Neurological: Negative for focal weakness or numbness.  Positive for headache and vision changes and dizziness  ____________________________________________   PHYSICAL EXAM:  VITAL SIGNS: Vitals:   02/27/21 1700 02/27/21 1730  BP: (!) 167/74 (!) 187/93  Pulse: (!) 59 (!) 58  Resp: 17 15  Temp:    SpO2: 97% 100%     Constitutional: Alert and oriented. Well appearing and in no acute distress. Eyes: Conjunctivae are normal. PERRL. EOMI. Head: Atraumatic. Nose: No congestion/rhinnorhea. Mouth/Throat: Mucous membranes are moist.  Oropharynx non-erythematous. Neck: No stridor. No cervical spine tenderness to palpation. Cardiovascular: Normal rate, regular rhythm. Grossly normal heart sounds.  Good  peripheral circulation. Respiratory: Normal respiratory effort.  No retractions. Lungs CTAB. Gastrointestinal: Soft , nondistended, nontender to palpation. No CVA tenderness. Musculoskeletal: No lower extremity tenderness nor edema.  No joint effusions. No signs of acute trauma. Neurologic:  Normal speech and language.  No gait instability noted. Left-sided homonymous hemianopsia is noted on visual field testing. Otherwise, cranial nerves II through XII intact 5/5 strength and sensation in all 4 extremities Skin:  Skin is warm, dry and intact. No rash noted. Psychiatric: Mood and affect are normal. Speech and behavior are normal. ____________________________________________   LABS (all labs ordered are listed, but only abnormal results are displayed)  Labs Reviewed  BASIC METABOLIC PANEL - Abnormal; Notable for the following components:      Result Value   Glucose, Bld 105 (*)    All other components within normal limits  CBC WITH DIFFERENTIAL/PLATELET - Abnormal;  Notable for the following components:   WBC 12.7 (*)    Neutro Abs 7.9 (*)    Abs Immature Granulocytes 0.08 (*)    All other components within normal limits  URINALYSIS, COMPLETE (UACMP) WITH MICROSCOPIC - Abnormal; Notable for the following components:   Color, Urine YELLOW (*)    APPearance CLEAR (*)    Leukocytes,Ua SMALL (*)    All other components within normal limits  RESP PANEL BY RT-PCR (FLU A&B, COVID) ARPGX2  ETHANOL  PROTIME-INR  APTT  URINE DRUG SCREEN, QUALITATIVE ([facility] ONLY)  URINALYSIS, ROUTINE W REFLEX MICROSCOPIC  HEMOGLOBIN A1C  LIPID PANEL  TROPONIN I (HIGH SENSITIVITY)  TROPONIN I (HIGH SENSITIVITY)   ____________________________________________  12 Lead EKG  Sinus bradycardia, rate of 59 bpm.  Normal axis and intervals.  No evidence of acute ischemia. ____________________________________________  RADIOLOGY  ED MD interpretation:    Official radiology report(s): CT Head Wo Contrast  Result Date: 02/27/2021 CLINICAL DATA:  Headache, intracranial hemorrhage suspected; dizziness, nonspecific EXAM: CT HEAD WITHOUT CONTRAST TECHNIQUE: Contiguous axial images were obtained from the base of the skull through the vertex without intravenous contrast. COMPARISON:  04/09/2020 FINDINGS: Brain: There is an area of low attenuation in the right occipital lobe without volume loss. No acute intracranial hemorrhage or mass effect. Additional patchy hypoattenuation in the supratentorial white matter is nonspecific but probably reflects mild chronic microvascular ischemic changes. Ventricles and sulci are within normal limits in size and configuration. Vascular: No hyperdense vessel or unexpected calcification. Skull: Calvarium is unremarkable. Sinuses/Orbits: No acute finding. Other: None. IMPRESSION: Likely late acute or subacute small infarct of the right occipital lobe. No hemorrhage or mass effect. Electronically Signed   By: Guadlupe Spanish M.D.   On: 02/27/2021 13:59     ____________________________________________   PROCEDURES and INTERVENTIONS  Procedure(s) performed (including Critical Care):  .1-3 Lead EKG Interpretation  Date/Time: 02/27/2021 5:51 PM Performed by: Delton Prairie, MD Authorized by: Delton Prairie, MD     Interpretation: normal     ECG rate:  58   ECG rate assessment: bradycardic     Rhythm: sinus bradycardia     Ectopy: none     Conduction: normal    Medications   stroke: mapping our early stages of recovery book (has no administration in time range)  acetaminophen (TYLENOL) tablet 650 mg (has no administration in time range)    Or  acetaminophen (TYLENOL) 160 MG/5ML solution 650 mg (has no administration in time range)    Or  acetaminophen (TYLENOL) suppository 650 mg (has no administration in time range)  enoxaparin (LOVENOX) injection 40 mg (  has no administration in time range)  hydrALAZINE (APRESOLINE) tablet 25 mg (has no administration in time range)  losartan (COZAAR) tablet 25 mg (has no administration in time range)  hydrALAZINE (APRESOLINE) injection 5 mg (has no administration in time range)  aspirin chewable tablet 324 mg (324 mg Oral Given 02/27/21 1659)    ____________________________________________   MDM / ED COURSE   Pleasant 80 year old woman presents to the ED with few days of vision changes and dizziness, with evidence of a new ischemic stroke requiring medical admission.  Mildly hypertensive, that we will permit.  Exam with vision loss consistent with a left-sided homonymous hemianopsia, but no further acute neurologic deficits are able to be elicited.  Considering the length of time her symptoms have been present, stroke alert was not called.  She has no signs of trauma and denies any recent falls.  No bleed on CT head, and ischemic stroke pattern is noted.  No evidence of ACS, no evidence of acute cystitis and blood work is largely benign.  We will admit to medicine for further work-up and management of  her acute stroke.   Clinical Course as of 02/27/21 1752  Thu Feb 27, 2021  1747 I discuss the case with Dr. Sedalia Muta, who agrees to see for admission [DS]    Clinical Course User Index [DS] Delton Prairie, MD    ____________________________________________   FINAL CLINICAL IMPRESSION(S) / ED DIAGNOSES  Final diagnoses:  Cerebrovascular accident (CVA) due to other mechanism [facility])  Left homonymous hemianopsia  Dizziness     ED Discharge Orders     None        Sharee Sturdy [place]head   Note:  This document was prepared using Dragon voice recognition software and may include unintentional dictation errors.    Delton Prairie, MD 02/27/21 [phone]

## 2021-02-27 NOTE — ED Notes (Signed)
Pt reports fall in march, denies falls since then. C/o dizziness x1 week, blurred vision that started yesterday, and headache.

## 2021-02-27 NOTE — ED Provider Notes (Signed)
Emergency Medicine Provider Triage Evaluation Note  Tiffany Mcbride , a 80 y.o. female  was evaluated in triage.  Pt complains of 3 days of headache with associated blurry vision. She also noted nausea and near-syncope. She is also noting some tingling to the fingers bilaterally. She denies syncope, paralysis, slurred speech, or weakness.   Review of Systems  Positive: Headache, dizziness Negative: Chest pain, SOB  Physical Exam  There were no vitals taken for this visit. Gen:   Awake, no distress  NAD Resp:  Normal effort CTA MSK:   Moves extremities without difficulty  Other:  CVS RRR  Medical Decision Making  Medically screening exam initiated at 12:55 PM.  Appropriate orders placed.  GUDELIA EUGENE was informed that the remainder of the evaluation will be completed by another provider, this initial triage assessment does not replace that evaluation, and the importance of remaining in the ED until their evaluation is complete.  Patient with ED evaluation of 3-day c/o headache and blurry vision.    Lissa Hoard, PA-C 02/27/21 1308    Dionne Bucy, MD 02/27/21 1652

## 2021-02-27 NOTE — H&P (Addendum)
History and Physical   Tiffany Mcbride:[id number] DOB: 1940-12-08 DOA: 02/27/2021  PCP: Lauro Regulus, MD  Patient coming from: Urgent clinic  I have personally briefly reviewed patient's old medical records in [facility] EMR.  Chief Concern: Blurred vision nausea, vomiting  HPI: Tiffany Mcbride is a 80 y.o. female with medical history significant for GERD, history of vaginal bleeding approximately 5 to 10 years ago with aspirin use, history of concussion in fall 2021 after a fall, presents the emergency department for chief concerns of vision changes.  Patient reports that about 2 to 3 days ago patient started developing blurry vision, and double vision.  She also endorses frontal headache.  She denies recent headaches, difficulty speaking, difficulty ambulating, extremity weakness.  She denies recent trauma to her person.  She denies fever, chest pain, shortness of breath, abdominal pain, vomiting, diarrhea.  She does endorse nausea however has not vomited.  She states she has never felt this way before.  She denies personal history of a stroke.  She has not been diagnosed with high blood pressure or diabetes.  Social history: She lives at home by herself.  She denies tobacco, EtOH, recreational drug use.  Vaccination history: She is vaccinated for COVID-19  ROS: Constitutional: no weight change, no fever ENT/Mouth: no sore throat, no rhinorrhea Eyes: no eye pain, no vision changes Cardiovascular: no chest pain, no dyspnea,  no edema, no palpitations Respiratory: no cough, no sputum, no wheezing Gastrointestinal: no nausea, no vomiting, no diarrhea, no constipation Genitourinary: no urinary incontinence, no dysuria, no hematuria Musculoskeletal: no arthralgias, no myalgias Skin: no skin lesions, no pruritus, Neuro: + weakness, no loss of consciousness, no syncope Psych: no anxiety, no depression, + decrease appetite Heme/Lymph: no bruising, no bleeding  ED Course:  Discussed with emergency medicine provider, patient requiring hospitalization for acute stroke.  Vitals in the emergency department was remarkable for temperature 98.1, respiration rate of 18, blood pressure 133/100, heart rate of 58.  Labs in the emergency department was remarkable for sodium 138, potassium 3.8, chloride 105, bicarb 26, BUN 22, serum creatinine of 0.84, nonfasting blood glucose 105, WBC 12.7, hemoglobin 14.9, platelets 330.  UA was remarkable for small leukocytes.  EDP ordered aspirin 324 mg p.o. once.  Assessment/Plan  Principal Problem:   Stroke [facility]) Active Problems:   B12 deficiency   History of compression fracture of spine   Leukocytosis   Hyperlipidemia   # Stroke-like symptoms - CT head without contrast  - Complete echo ordered - MRI of the brain Was read as acute right PCA territory infarct in the right occipital lobe and the medial right temporal lobe.  Mild associated edema without significant mass-effect. - Fasting lipid and A1c ordered, check B12 - Patient is outside the window for permissive hypertension - Patient is outside the window for any thrombectomy - Fall precaution - Frequent neuro monitoring - A.m. team to consult neurology  - Aspirin 81 mg daily started starting 02/28/2021  # Hypertension-patient is not on antihypertensive - Losartan 25 mg one-time dose ordered  - Hydralazine 25 mg once - Hydralazine 5 mg IV every 6 hours as needed for SBP greater than 180, 3 doses ordered  # Leukocytosis-patient denies urinary symptoms - Portable chest x-ray ordered - Procalcitonin - May be reactive - CBC in a.m.,   Chart reviewed.   DVT prophylaxis: SCDs Code Status: Full code Diet: Heart healthy Family Communication: No Disposition Plan: Pending clinical course Consults called: None at this time  Admission status: MedSurg, observation, telemetry  Past Medical History:  Diagnosis Date   Arthritis    At high risk for falls    GERD  (gastroesophageal reflux disease)    Glaucoma of both eyes    Heart murmur    wen she was in her 30's   History of gastric ulcer    History of kidney stones    Hyperlipidemia    Left ureteral calculus    Urgency of urination    Past Surgical History:  Procedure Laterality Date   BACK SURGERY  07/2016   kyphoplasty   CERVICAL SPINE SURGERY  1992   posterior diskectomy   COLONOSCOPY     CYSTOSCOPY WITH RETROGRADE PYELOGRAM, URETEROSCOPY AND STENT PLACEMENT Left 03/09/2013   Procedure: CYSTOSCOPY WITH RETROGRADE PYELOGRAM, URETEROSCOPY AND LEFT STENT PLACEMENT;  Surgeon: Sebastian Ache, MD;  Location: [facility];  Service: Urology;  Laterality: Left;   ENDOMETRIAL ABLATION     EYE SURGERY Bilateral    cataract extractions   KYPHOPLASTY N/A 07/23/2016   Procedure: KYPHOPLASTY T11;  Surgeon: Kennedy Bucker, MD;  Location: [facility];  Service: Orthopedics;  Laterality: N/A;   KYPHOPLASTY N/A 03/21/2019   Procedure: L3 KYPHOPLASTY;  Surgeon: Kennedy Bucker, MD;  Location: [facility];  Service: Orthopedics;  Laterality: N/A;   LAPAROSCOPIC CHOLECYSTECTOMY  1998   PUBOVAGINAL SLING  1995   TUBAL LIGATION     UPPER GI ENDOSCOPY     Social History:  reports that she has never smoked. She has never used smokeless tobacco. She reports that she does not drink alcohol and does not use drugs.  Allergies  Allergen Reactions   Codeine Hives   Meloxicam Other (See Comments)    Causes dizziness   Sulfa Antibiotics Hives   Family History  Problem Relation Age of Onset   Hypertension Mother    Heart disease Father    Cancer Sister        metastatic liver Ca   Heart disease Brother    Cancer Brother        Lung   Family history: Family history reviewed and not pertinent  Prior to Admission medications   Medication Sig Start Date End Date Taking? Authorizing Provider  Calcium Carb-Cholecalciferol (CALCIUM 600 + D PO) Take 2 tablets by mouth daily.    [provider]   Doxylamine Succinate, Sleep, (SLEEP AID PO) Take 2 tablets by mouth at bedtime as needed (sleep).    [provider]  FINACEA 15 % cream Apply 1 application topically 2 (two) times daily.  07/01/16   [provider]  Flaxseed, Linseed, (BIO-FLAX) 1000 MG CAPS Take 1,000 mg by mouth daily.    [provider]  latanoprost (XALATAN) 0.005 % ophthalmic solution Place 1 drop into both eyes at bedtime. 07/18/16   [provider]  magnesium citrate SOLN Take 1 Bottle by mouth as needed for severe constipation.    [provider]  mirabegron ER (MYRBETRIQ) 25 MG TB24 tablet Take 25 mg by mouth daily.  12/14/17   [provider]  naproxen sodium (ALEVE) 220 MG tablet Take 220 mg by mouth 2 (two) times daily as needed (pain).    [provider]  NEXIUM 40 MG capsule TAKE ONE CAPSULE BY MOUTH ONCE A DAY BEFORE BREAKFAST Patient taking differently: Take 40 mg by mouth daily.  05/19/13   Sherlene Shams, MD  senna-docusate (SENOKOT-S) 8.6-50 MG per tablet Take 1 tablet by mouth 2 (two) times daily.  While taking pain meds to prevent contipation Patient taking differently: Take 1 tablet by mouth daily as needed for mild constipation. While taking pain meds to prevent contipation 03/09/13   Sebastian AcheManny, Theodore, MD  timolol (TIMOPTIC) 0.5 % ophthalmic solution Place 1 drop into both eyes 2 (two) times daily. 06/30/16   [provider]  vitamin B-12 (CYANOCOBALAMIN) 500 MCG tablet Take 500 mcg by mouth daily.    [provider]   Physical Exam: Vitals:   02/27/21 1256 02/27/21 1700 02/27/21 1730  BP: (!) 133/100 (!) 167/74 (!) 187/93  Pulse: 60 (!) 59 (!) 58  Resp: 18 17 15   Temp: 98.1 F (36.7 C)    TempSrc: Oral    SpO2: 96% 97% 100%  Weight: 90 kg    Height: 5\' 2"  (1.575 m)     Constitutional: appears age-appropriate, NAD, calm, comfortable Eyes: PERRL, lids and conjunctivae normal ENMT: Mucous membranes are moist. Posterior  pharynx clear of any exudate or lesions. Age-appropriate dentition. Hearing appropriate Neck: normal, supple, no masses, no thyromegaly Respiratory: clear to auscultation bilaterally, no wheezing, no crackles. Normal respiratory effort. No accessory muscle use.  Cardiovascular: Regular rate and rhythm, no murmurs / rubs / gallops. No extremity edema. 2+ pedal pulses. No carotid bruits.  Abdomen: no tenderness, no masses palpated, no hepatosplenomegaly. Bowel sounds positive.  Musculoskeletal: no clubbing / cyanosis. No joint deformity upper and lower extremities. Good ROM, no contractures, no atrophy. Normal muscle tone.  Skin: no rashes, lesions, ulcers. No induration Neurologic: Sensation intact. Strength 5/5 in all 4.  Psychiatric: Normal judgment and insight. Alert and oriented x 3. Normal mood.   EKG: independently reviewed, showing sinus bradycardia with rate of 59, QTC of 392  Chest x-ray on Admission: I personally reviewed and I agree with radiologist reading as below.  CT Head Wo Contrast  Result Date: 02/27/2021 CLINICAL DATA:  Headache, intracranial hemorrhage suspected; dizziness, nonspecific EXAM: CT HEAD WITHOUT CONTRAST TECHNIQUE: Contiguous axial images were obtained from the base of the skull through the vertex without intravenous contrast. COMPARISON:  04/09/2020 FINDINGS: Brain: There is an area of low attenuation in the right occipital lobe without volume loss. No acute intracranial hemorrhage or mass effect. Additional patchy hypoattenuation in the supratentorial white matter is nonspecific but probably reflects mild chronic microvascular ischemic changes. Ventricles and sulci are within normal limits in size and configuration. Vascular: No hyperdense vessel or unexpected calcification. Skull: Calvarium is unremarkable. Sinuses/Orbits: No acute finding. Other: None. IMPRESSION: Likely late acute or subacute small infarct of the right occipital lobe. No hemorrhage or mass effect.  Electronically Signed   By: Guadlupe SpanishPraneil  Patel M.D.   On: 02/27/2021 13:59   MR BRAIN WO CONTRAST  Result Date: 02/27/2021 CLINICAL DATA:  acute cva, eval infarct. primarily dizziness and left sided hemianopsia EXAM: MRI HEAD WITHOUT CONTRAST TECHNIQUE: Multiplanar, multiecho pulse sequences of the brain and surrounding structures were obtained without intravenous contrast. COMPARISON:  Same day CT head.  MRI 03/25/2012. FINDINGS: Brain: Acute right PCA territory infarcts involving the right occipital lobe and the medial right temporal lobe. This includes acute infarcts in the right para hippocampal region. Mild for age additional scattered T2/FLAIR hyperintensities within the white matter, most likely related to chronic microvascular ischemic disease. No evidence of acute hemorrhage, hydrocephalus, extra-axial fluid collection, or mass lesion. Vascular: Major arterial flow voids are maintained skull base. There is a small right intradural vertebral artery flow void, which appears similar to 2013 prior. Skull and upper cervical spine:  Normal marrow signal. Sinuses/Orbits: Clear sinuses.  Unremarkable orbits. Other: No sizable mastoid effusions. IMPRESSION: 1. Acute right PCA territory infarcts in the right occipital lobe and the medial right temporal lobe. Mild associated edema without significant mass effect. 2. Mild for age chronic microvascular ischemic disease. Electronically Signed   By: Feliberto Harts MD   On: 02/27/2021 18:27    Labs on Admission: I have personally reviewed following labs  CBC: Recent Labs  Lab 02/27/21 1301  WBC 12.7*  NEUTROABS 7.9*  HGB 14.9  HCT 45.0  MCV 98.9  PLT 330   Basic Metabolic Panel: Recent Labs  Lab 02/27/21 1301  NA 138  K 3.8  CL 105  CO2 26  GLUCOSE 105*  BUN 22  CREATININE 0.84  CALCIUM 9.5   GFR: Estimated Creatinine Clearance: 58.5 mL/min (by C-G formula based on SCr of 0.84 mg/dL).  Urine analysis:    Component Value Date/Time    COLORURINE YELLOW (A) 02/27/2021 1420   APPEARANCEUR CLEAR (A) 02/27/2021 1420   APPEARANCEUR Cloudy 02/09/2013 1058   LABSPEC 1.019 02/27/2021 1420   LABSPEC 1.030 02/09/2013 1058   PHURINE 5.0 02/27/2021 1420   GLUCOSEU NEGATIVE 02/27/2021 1420   GLUCOSEU Negative 02/09/2013 1058   HGBUR NEGATIVE 02/27/2021 1420   BILIRUBINUR NEGATIVE 02/27/2021 1420   BILIRUBINUR Negative 02/09/2013 1058   KETONESUR NEGATIVE 02/27/2021 1420   PROTEINUR NEGATIVE 02/27/2021 1420   UROBILINOGEN 0.2 02/08/2012 1558   NITRITE NEGATIVE 02/27/2021 1420   LEUKOCYTESUR SMALL (A) 02/27/2021 1420   LEUKOCYTESUR 1+ 02/09/2013 1058   Dr. Sedalia Muta [facility]  If 7PM-7AM, please contact overnight-coverage provider If 7AM-7PM, please contact day coverage provider [email or web address]  02/27/2021, 7:03 PM

## 2021-02-28 ENCOUNTER — Observation Stay: Payer: Medicare Other

## 2021-02-28 ENCOUNTER — Observation Stay (HOSPITAL_BASED_OUTPATIENT_CLINIC_OR_DEPARTMENT_OTHER)
Admit: 2021-02-28 | Discharge: 2021-02-28 | Disposition: A | Discharge status: Home / Self Care | Payer: Medicare Other | Attending: Internal Medicine | Admitting: Internal Medicine

## 2021-02-28 ENCOUNTER — Encounter: Payer: Self-pay | Admitting: Internal Medicine

## 2021-02-28 DIAGNOSIS — I6389 Other cerebral infarction: Secondary | ICD-10-CM | POA: Diagnosis not present

## 2021-02-28 DIAGNOSIS — I639 Cerebral infarction, unspecified: Secondary | ICD-10-CM | POA: Diagnosis not present

## 2021-02-28 DIAGNOSIS — I63431 Cerebral infarction due to embolism of right posterior cerebral artery: Secondary | ICD-10-CM

## 2021-02-28 DIAGNOSIS — E538 Deficiency of other specified B group vitamins: Secondary | ICD-10-CM | POA: Diagnosis not present

## 2021-02-28 LAB — BASIC METABOLIC PANEL
Anion gap: 6 (ref 5–15)
BUN: 19 mg/dL (ref 8–23)
CO2: 27 mmol/L (ref 22–32)
Calcium: 9.1 mg/dL (ref 8.9–10.3)
Chloride: 107 mmol/L (ref 98–111)
Creatinine, Ser: 0.7 mg/dL (ref 0.44–1.00)
GFR, Estimated: >60 mL/min (ref >60)
Glucose, Bld: 100 mg/dL — ABNORMAL HIGH (ref 70–99)
Potassium: 4 mmol/L (ref 3.5–5.1)
Sodium: 140 mmol/L (ref 135–145)

## 2021-02-28 LAB — ECHOCARDIOGRAM COMPLETE
AR max vel: 1.79 cm2
AV Area VTI: 2.02 cm2
AV Area mean vel: 1.96 cm2
AV Mean grad: 8 mmHg
AV Peak grad: 17.1 mmHg
Ao pk vel: 2.07 m/s
Area-P 1/2: 2.57 cm2
Height: 62 in
MV VTI: 3.05 cm2
S' Lateral: 2.8 cm
Weight: 3234.59 oz

## 2021-02-28 LAB — LIPID PANEL
Cholesterol: 226 mg/dL — ABNORMAL HIGH (ref 0–200)
HDL: 60 mg/dL (ref >40)
LDL Cholesterol: 132 mg/dL — ABNORMAL HIGH (ref 0–99)
Total CHOL/HDL Ratio: 3.8 RATIO
Triglycerides: 172 mg/dL — ABNORMAL HIGH (ref <150)
VLDL: 34 mg/dL (ref 0–40)

## 2021-02-28 LAB — CBC
HCT: 39.8 % (ref 36.0–46.0)
Hemoglobin: 13.4 g/dL (ref 12.0–15.0)
MCH: 32.1 pg (ref 26.0–34.0)
MCHC: 33.7 g/dL (ref 30.0–36.0)
MCV: 95.4 fL (ref 80.0–100.0)
Platelets: 265 10*3/uL (ref 150–400)
RBC: 4.17 MIL/uL (ref 3.87–5.11)
RDW: 13.5 % (ref 11.5–15.5)
WBC: 10.5 10*3/uL (ref 4.0–10.5)
nRBC: 0 % (ref 0.0–0.2)

## 2021-02-28 LAB — PROCALCITONIN
Procalcitonin: <0.1 ng/mL
Procalcitonin: <0.1 ng/mL

## 2021-02-28 MED ORDER — TIMOLOL MALEATE 0.5 % OP SOLN
1.0000 [drp] | Freq: Two times a day (BID) | OPHTHALMIC | Status: DC
  Administered 2021-02-28 – 2021-03-01 (×3): 1 [drp] via OPHTHALMIC
  Filled 2021-02-28 (×2): qty 5

## 2021-02-28 MED ORDER — LATANOPROST 0.005 % OP SOLN: 1.0000 [drp] | Freq: Every day | OPHTHALMIC | Status: DC

## 2021-02-28 MED ORDER — PANTOPRAZOLE SODIUM 40 MG PO TBEC
40.0000 mg | DELAYED_RELEASE_TABLET | Freq: Every day | ORAL | Status: DC
  Administered 2021-02-28 – 2021-03-01 (×2): 40 mg via ORAL
  Filled 2021-02-28 (×2): qty 1

## 2021-02-28 MED ORDER — IOHEXOL 350 MG/ML SOLN
75.0000 mL | Freq: Once | INTRAVENOUS | Status: AC | PRN
  Administered 2021-02-28: 21:00:00 75 mL via INTRAVENOUS

## 2021-02-28 MED ORDER — CLOPIDOGREL BISULFATE 75 MG PO TABS
75.0000 mg | ORAL_TABLET | Freq: Every day | ORAL | Status: DC
  Administered 2021-02-28 – 2021-03-01 (×2): 75 mg via ORAL
  Filled 2021-02-28 (×2): qty 1

## 2021-02-28 MED ORDER — ATORVASTATIN CALCIUM 20 MG PO TABS: 40.0000 mg | ORAL_TABLET | Freq: Every day | ORAL | Status: DC

## 2021-02-28 MED ORDER — LATANOPROST 0.005 % OP SOLN
1.0000 [drp] | Freq: Every day | OPHTHALMIC | Status: DC
  Administered 2021-02-28: 1 [drp] via OPHTHALMIC
  Filled 2021-02-28: qty 2.5

## 2021-02-28 MED ORDER — ATORVASTATIN CALCIUM 20 MG PO TABS
40.0000 mg | ORAL_TABLET | Freq: Every day | ORAL | Status: DC
  Administered 2021-02-28: 12:00:00 40 mg via ORAL
  Filled 2021-02-28: qty 2

## 2021-02-28 NOTE — Evaluation (Signed)
Occupational Therapy Evaluation Patient Details Name: Tiffany Mcbride MRN: [id number] DOB: 1940-10-16 Today's Date: 02/28/2021    History of Present Illness Pt is a 80 y/o F admittd on 02/27/21 with c/c of blurred vision, diplopia, & N&V that began 2-3 days ago. MRI reveals acute R PCA infarct in the R occipital lobe & medial R temporal lobe. PMH: GERD, concussion in fall 2021 2/2 fall, arthritis, glaucoma in B eyes, HLD, heart murmur   Clinical Impression   Pt seen for OT evaluation this date in setting of acute hospitalization d/t CVA. Pt presents this date reporting that her vision is much improved and upon vision assessment, pt is able to functionally track in all planes. Pt reports living alone and being INDEP at baseline and using community transportation. On ADL assessment this date: Pt requries increased time to perform self care, but ultimately requires no assistance. Uses SPC at baseline for fxl mobility and uses today to/from restroom. Pt with G standing balance for self care at sink with no AD. transfers to/from commode wtih use of grab bar, but not external assistance. She has h/o arthritis in hands, so some difficulty at baseline opening things, does not appear to be more impacted with [facility] ADL tasks such as condiments/containers despite slight probably dysdiadochokinesia noted to L UE in RAM test. Overall, pt close to her functional baseline with symptoms continuing to improve throughout the day per patient. Based on today's assessment, anticipate pt will not require further OT f/u in acute setting or upon d/c. Will complete order at this time.    Follow Up Recommendations  No OT follow up    Equipment Recommendations  Tub/shower seat    Recommendations for Other Services       Precautions / Restrictions Precautions Precautions: Fall Restrictions Weight Bearing Restrictions: No      Mobility Bed Mobility Overal bed mobility: Modified Independent       Supine to sit:  Modified independent (Device/Increase time)     General bed mobility comments: uses SPC to push trunk upright    Transfers Overall transfer level: Modified independent Equipment used: Straight cane Transfers: Sit to/from Stand Sit to Stand: Modified independent (Device/Increase time);Supervision              Balance Overall balance assessment: Mild deficits observed, not formally tested                                         ADL either performed or assessed with clinical judgement   ADL Overall ADL's : Modified independent                                       General ADL Comments: Pt requries increased time to perform self care, but ultimately requires no assistance. Uses SPC at baseline for fxl mobility and uses today to/from restroom. Pt with G standing balance for self care at sink with no AD. transfers to/from commode wtih use of grab bar, but not external assistance. She has h/o arthritis in hands, so some difficulty at baseline opening things, does not appear to be more impacted with [facility] ADL tasks such as condiments/containers despite slight probably dysdiadochokinesia noted to L UE in RAM test.     Vision Patient Visual Report: Blurring of vision Vision Assessment?: Yes Eye Alignment:  Within Functional Limits Ocular Range of Motion: Within Functional Limits Alignment/Gaze Preference: Within Defined Limits Tracking/Visual Pursuits: Able to track stimulus in all quads without difficulty Saccades: Within functional limits Convergence: Within functional limits Additional Comments: reporting her blurring vision is going away, much improved today, feels close to her baseline vision     Perception     Praxis      Pertinent Vitals/Pain Pain Assessment: Faces Faces Pain Scale: Hurts a little bit Pain Location: chronic arthritic L knee pain Pain Descriptors / Indicators: Aching Pain Intervention(s): Monitored during session      Hand Dominance Right   Extremity/Trunk Assessment Upper Extremity Assessment Upper Extremity Assessment: RUE deficits/detail;LUE deficits/detail RUE Deficits / Details: WFL LUE Deficits / Details: very slight difference noted on rapid alternating movements test with L UE slightly slower than R. Overall, L UE still very functionally appropriate, pt able to compelte all tasks.   Lower Extremity Assessment Lower Extremity Assessment: Defer to PT evaluation       Communication Communication Communication: No difficulties   Cognition Arousal/Alertness: Awake/alert Behavior During Therapy: WFL for tasks assessed/performed Overall Cognitive Status: Within Functional Limits for tasks assessed                                     General Comments       Exercises Other Exercises Other Exercises: OT ed re: role of OT in acute setting. Pt with good understanding.   Shoulder Instructions      Home Living Family/patient expects to be discharged to:: Private residence Living Arrangements: Alone Available Help at Discharge: Family;Available PRN/intermittently Type of Home: Apartment Home Access: Level entry     Home Layout: One level               Home Equipment: Cane - single point;Walker - 4 wheels          Prior Functioning/Environment Level of Independence: Independent with assistive device(s)        Comments: Independent with SPC in home, rollator in community. Uses community transportation.        OT Problem List: Impaired vision/perception      OT Treatment/Interventions: Self-care/ADL training;Therapeutic activities    OT Goals(Current goals can be found in the care plan section) Acute Rehab OT Goals Patient Stated Goal: return home OT Goal Formulation: All assessment and education complete, DC therapy  OT Frequency:     Barriers to D/C:            Co-evaluation              AM-PAC OT "6 Clicks" Daily Activity      Outcome Measure Help from another person eating meals?: None Help from another person taking care of personal grooming?: None Help from another person toileting, which includes using toliet, bedpan, or urinal?: None Help from another person bathing (including washing, rinsing, drying)?: A Little (SUPV d/t wet floor) Help from another person to put on and taking off regular upper body clothing?: None Help from another person to put on and taking off regular lower body clothing?: None 6 Click Score: 23   End of Session Nurse Communication: Mobility status;Other (comment) (noted to RN and CNA that pt required no assistance for ADL mobility to/from restroom and she is requesting that she be allowed to get herself to the restroom, RN/CNA okay'ed)  Activity Tolerance: Patient tolerated treatment well Patient left: in bed;with call  bell/phone within reach  OT Visit Diagnosis: Other symptoms and signs involving the nervous system (R67.893)                Time: 8101-7510 OT Time Calculation (min): 20 min Charges:  OT General Charges $OT Visit: 1 Visit OT Evaluation $OT Eval Low Complexity: 1 Low OT Treatments $Self Care/Home Management : 8-22 mins  Rejeana Brock, MS, OTR/L ascom [phone] 02/28/21, 6:31 PM

## 2021-02-28 NOTE — Progress Notes (Signed)
PROGRESS NOTE  Tiffany Mcbride  DOB: 22-Apr-1941  PCP: Lauro Regulus, MD OJJ:[id number]  DOA: 02/27/2021  LOS: 0 days  Hospital Day: 2   Chief Complaint  Patient presents with   Dizziness   Headache   Blurred Vision    Brief narrative: Tiffany Mcbride is a 80 y.o. female with PMH significant for GERD, history of vaginal bleeding approximately 5 to 10 years ago with aspirin use, history of concussion in fall 2021 after a fall. 7/28, patient presented from home with complaint of dizziness, blurred vision, nausea and headache for 3 days. Patient reported that about 2 to 3 days ago patient started developing blurry vision, and double vision, stated with frontal headache without history of trauma, fever and other focal deficits.  In the ED, patient was hemodynamically stable.  Labs unremarkable.  CT head showed probable late acute or subacute small infarct of the right occipital lobe without hemorrhage or mass effect. MRI brain showed an acute right PCA territory infarcts in the right occipital lobe and the medial right temporal lobe with mild associated edema without significant mass effect. Admitted to hospitalist service for further evaluation management of stroke  Subjective: Patient was seen and examined this morning.  Pleasant elderly Caucasian female.  Propped up in bed.  Not in distress.  No new symptoms.  Patient states that her double vision essentially resolved.  Assessment/Plan: Acute right PCA territory stroke -Presented with 2 to 3 days history of blurry vision, double vision, dizziness -Imaging as above showing acute right PCA territory infarct in the right occipital lobe and medial right temporal lobe -Stroke work-up initiated -HDL 60, LDL 132, pending A1c -Echocardiogram with EF 60 to 65%, normal right ventricular size and systolic function. -carotid duplex showed less than 50% stenosis on both internal carotid arteries. -Neurology, PT/OT/ST eval requested. -It  seems that patient was not on any antiplatelet or statin prior to presentation.  We will start her on aspirin 81 mg daily, Plavix 75 mg daily and Lipitor. No results found for: HGBA1C   Elevated blood pressure -No history of hypertension.  Blood pressure was elevated as high as 187/93 last night.  Permissive hypertension for first 48 hours. -Blood pressure seems to have spontaneously improved today.  Leukocytosis -WBC count was initially elevated.  No other evidence of infection.  Procalcitonin level normal.  WC count improved this morning. Recent Labs  Lab 02/27/21 1301 02/27/21 1702 02/28/21 0501  WBC 12.7*  --  10.5  PROCALCITON  --  <0.10 <0.10   Mobility: Current ambulation.  PT eval pending Code Status:   Code Status: Full Code  Nutritional status: Body mass index is 36.98 kg/m.     Diet:  Diet Order             Diet Heart Room service appropriate? Yes; Fluid consistency: Thin  Diet effective now                  DVT prophylaxis:  SCD's Start: 02/27/21 1748   Antimicrobials: None Fluid: None Consultants: Neurology Family Communication: None at bedside  Status is: Inpatient  Remains inpatient appropriate because: Stroke work-up ongoing  Dispo: The patient is from: [facility] living community              Anticipated d/c is to: Pending PT eval              Patient currently is not medically stable to d/c.   Difficult to place patient No  Infusions:    Scheduled Meds:   stroke: mapping our early stages of recovery book   Does not apply Once   aspirin EC  81 mg Oral Daily   atorvastatin  40 mg Oral Daily   clopidogrel  75 mg Oral Daily   hydrALAZINE  25 mg Oral Once   latanoprost  1 drop Both Eyes QHS   pantoprazole  40 mg Oral Daily   timolol  1 drop Both Eyes BID    Antimicrobials: Anti-infectives (From admission, onward)    None       PRN meds: acetaminophen **OR** acetaminophen (TYLENOL) oral liquid 160 mg/5 mL **OR** acetaminophen,  hydrALAZINE   Objective: Vitals:   02/28/21 0749 02/28/21 1150  BP: (!) 150/65 136/70  Pulse: 66 63  Resp: 16 20  Temp: 97.7 F (36.5 C) 98.3 F (36.8 C)  SpO2: 98% 96%    Intake/Output Summary (Last 24 hours) at 02/28/2021 1502 Last data filed at 02/28/2021 1158 Gross per 24 hour  Intake 680 ml  Output 775 ml  Net -95 ml   Filed Weights   02/27/21 1256 02/27/21 1959  Weight: 90 kg 91.7 kg   Weight change:  Body mass index is 36.98 kg/m.   Physical Exam: General exam: Pleasant, elderly Caucasian female.  Not in physical distress Skin: No rashes, lesions or ulcers. HEENT: Atraumatic, normocephalic, no obvious bleeding Lungs: Clear to auscultation bilaterally CVS: Regular rate and rhythm, no murmur GI/Abd soft, nontender, nondistended, bowel sound present CNS: Alert, awake, oriented x3 Psychiatry: Mood appropriate Extremities: No pedal edema, no calf tenderness  Data Review: I have personally reviewed the laboratory data and studies available.  Recent Labs  Lab 02/27/21 1301 02/28/21 0501  WBC 12.7* 10.5  NEUTROABS 7.9*  --   HGB 14.9 13.4  HCT 45.0 39.8  MCV 98.9 95.4  PLT 330 265   Recent Labs  Lab 02/27/21 1301 02/28/21 0501  NA 138 140  K 3.8 4.0  CL 105 107  CO2 26 27  GLUCOSE 105* 100*  BUN 22 19  CREATININE 0.84 0.70  CALCIUM 9.5 9.1    F/u labs ordered Unresulted Labs (From admission, onward)     Start     Ordered   02/28/21 0500  Hemoglobin A1c  (Labs)  Tomorrow morning,   STAT        02/27/21 1749   02/28/21 0500  Procalcitonin  Daily,   R      02/27/21 2309            Signed, Lorin Glass, MD [facility] 02/28/2021

## 2021-02-28 NOTE — Progress Notes (Signed)
*  PRELIMINARY RESULTS* Echocardiogram 2D Echocardiogram has been performed.  Tiffany Mcbride 02/28/2021, 10:00 AM

## 2021-02-28 NOTE — Progress Notes (Signed)
SLP Cancellation Note  Patient Details Name: Tiffany Mcbride MRN: [id number] DOB: [birth year]   Cancelled treatment:       Reason Eval/Treat Not Completed: SLP screened, no needs identified, will sign off  Kenidy Crossland B. Dreama Saa M.S., CCC-SLP, [facility] Speech-Language Pathologist Rehabilitation Services Office [phone]  Reuel Derby 02/28/2021, 2:48 PM

## 2021-02-28 NOTE — Evaluation (Addendum)
Physical Therapy Evaluation Patient Details Name: Tiffany Mcbride MRN: [id number] DOB: 1941/02/18 Today's Date: 02/28/2021   History of Present Illness  Pt is a 80 y/o F admittd on 02/27/21 with c/c of blurred vision, diplopia, & N&V that began 2-3 days ago. MRI reveals acute R PCA infarct in the R occipital lobe & medial R temporal lobe. PMH: GERD, concussion in fall 2021 2/2 fall, arthritis, glaucoma in B eyes, HLD, heart murmur  Clinical Impression  Pt seen for PT evaluation with pt reporting she lives on the 4th floor apartment with elevator access. On this date, pt completes bed mobility mod I, transfers with supervision, and is able to ambulate 1 lap around nurses station with [facility] & close supervision. Pt endorses chronic L knee pain during session, monitored throughout session. Will continue to follow pt acutely to address endurance & balance. Do not anticipate pt will need PT f/u upon d/c.     Follow Up Recommendations No PT follow up;Supervision - Intermittent    Equipment Recommendations  None recommended by PT    Recommendations for Other Services       Precautions / Restrictions Precautions Precautions: Fall Restrictions Weight Bearing Restrictions: No      Mobility  Bed Mobility Overal bed mobility: Modified Independent       Supine to sit: Modified independent (Device/Increase time)     General bed mobility comments: uses SPC to push trunk upright    Transfers Overall transfer level: Needs assistance Equipment used: Straight cane Transfers: Sit to/from Stand Sit to Stand: Supervision            Ambulation/Gait Ambulation/Gait assistance: Supervision Gait Distance (Feet): 180 Feet Assistive device: Straight cane Gait Pattern/deviations: Decreased weight shift to left        Stairs            Wheelchair Mobility    Modified Rankin (Stroke Patients Only)       Balance Overall balance assessment: Mild deficits observed, not formally  tested                                           Pertinent Vitals/Pain Pain Assessment: Faces Faces Pain Scale: Hurts a little bit Pain Location: chronic arthritic L knee pain Pain Descriptors / Indicators: Aching Pain Intervention(s): Limited activity within patient's tolerance;Monitored during session    Home Living Family/patient expects to be discharged to:: Private residence Living Arrangements: Alone Available Help at Discharge: Family;Available PRN/intermittently Type of Home: Apartment Home Access: Level entry     Home Layout: One level Home Equipment: Cane - single point (rollator)      Prior Function Level of Independence: Independent with assistive device(s)         Comments: Independent with SPC in home, rollator in community. Uses community transportation.     Hand Dominance   Dominant Hand: Right    Extremity/Trunk Assessment        Lower Extremity Assessment Lower Extremity Assessment: Generalized weakness       Communication   Communication: No difficulties  Cognition Arousal/Alertness: Awake/alert Behavior During Therapy: WFL for tasks assessed/performed Overall Cognitive Status: Within Functional Limits for tasks assessed  General Comments      Exercises     Assessment/Plan    PT Assessment Patient needs continued PT services  PT Problem List Decreased strength;Decreased mobility;Decreased balance;Decreased activity tolerance       PT Treatment Interventions Therapeutic activities;DME instruction;Gait training;Therapeutic exercise;Patient/family education;Balance training;Functional mobility training;Neuromuscular re-education    PT Goals (Current goals can be found in the Care Plan section)  Acute Rehab PT Goals Patient Stated Goal: return home PT Goal Formulation: With patient Time For Goal Achievement: 03/14/21 Potential to Achieve Goals: Good     Frequency Min 5X/week   Barriers to discharge Decreased caregiver support lives alone    Co-evaluation               AM-PAC PT "6 Clicks" Mobility  Outcome Measure Help needed turning from your back to your side while in a flat bed without using bedrails?: None Help needed moving from lying on your back to sitting on the side of a flat bed without using bedrails?: None Help needed moving to and from a bed to a chair (including a wheelchair)?: A Little Help needed standing up from a chair using your arms (e.g., wheelchair or bedside chair)?: A Little Help needed to walk in hospital room?: A Little Help needed climbing 3-5 steps with a railing? : A Little 6 Click Score: 20    End of Session Equipment Utilized During Treatment: Gait belt Activity Tolerance: Patient tolerated treatment well Patient left: in chair;with call bell/phone within reach;with chair alarm set Nurse Communication: Mobility status PT Visit Diagnosis: Muscle weakness (generalized) (M62.81);Unsteadiness on feet (R26.81)    Time: 1350-1411 PT Time Calculation (min) (ACUTE ONLY): 21 min   Charges:   PT Evaluation $PT Eval Low Complexity: 1 Low          Aleda Grana, PT, DPT 02/28/21, 4:55 PM   Sandi Mariscal 02/28/2021, 4:55 PM

## 2021-02-28 NOTE — Progress Notes (Signed)
PT Cancellation Note  Patient Details Name: [name] MRN: [id number] DOB: 1940-10-05   Cancelled Treatment:    Reason Eval/Treat Not Completed: Other (comment) PT orders received, chart reviewed. Attempted to see pt for PT evaluation but transport present & taking pt off unit for [place]. Will f/u as able & as pt is available.  Aleda Grana, PT, DPT 02/28/21, 10:19 AM    Sandi Mariscal 02/28/2021, 10:19 AM

## 2021-02-28 NOTE — Consult Note (Signed)
Neurology Consultation Reason for Consult: Stroke on imaging Requesting Physician: Lorin Glass  CC: Vision changes  History is obtained from: Patient and chart review   HPI: Tiffany Mcbride is a 80 y.o. female with past medical history significant for hyperlipidemia (managed with diet), osteoarthritis, obesity (BMI 36.98), B12 deficiency, spinal compression fracture, cervical discectomy and T11 and L3 kyphoplasty  She reports that about 2 to 3 days prior to admission she did not have a headache as well as dizziness with position changes associated with some nausea but no emesis.  She had been out shopping on Wednesday and either on the bus on the way home or shortly after she got home she began to have some vision difficulty, noticing that she can see the bottom of her TV with a closed captions are.  She went and took a nap and did not think too much of it but in the morning when she woke up again on Thursday her vision was worse and therefore she presented to the ED for further evaluation.  Review of systems she notes that she has some chronic arthritis and had had some swelling in her left leg which improved with pain management shot for her arthritis.  Additionally she has been driving as recently as 3 to 4 weeks ago when she took a long 5-hour drive which did severely exacerbate her arthritis and convince her to give up driving.  She notes she sees a doctor regularly but was not aware she has hypertension, hyperlipidemia (which she notes she had many years ago but is managed with flaxseed oil due to not tolerating other medications).  She notes she had a sleep study many years ago which was negative but has gained weight and has not had it repeated.  She denies symptoms of sleep apnea such as excessive daytime fatigue although she does frequently take naps during the day, she denies frequent headaches, waking up gasping for air or being told that she snores.  She does note that she has had several  falls and most recently hit the back of her head about 5 months ago with a minor concussion but no other consequences to her knowledge.  She reports that she has been try to cut back on sugar and exercise more  LKW: 2-3 days PTA tPA given?: No, due to out of the window Premorbid modified rankin scale:     0 - No symptoms.  ROS: All other review of systems was negative except as noted in the HPI.   Past Medical History:  Diagnosis Date   Arthritis    At high risk for falls    GERD (gastroesophageal reflux disease)    Glaucoma of both eyes    Heart murmur    wen she was in her 30's   History of gastric ulcer    History of kidney stones    Hyperlipidemia    Left ureteral calculus    Urgency of urination    Past Surgical History:  Procedure Laterality Date   BACK SURGERY  07/2016   kyphoplasty   CERVICAL SPINE SURGERY  1992   posterior diskectomy   COLONOSCOPY     CYSTOSCOPY WITH RETROGRADE PYELOGRAM, URETEROSCOPY AND STENT PLACEMENT Left 03/09/2013   Procedure: CYSTOSCOPY WITH RETROGRADE PYELOGRAM, URETEROSCOPY AND LEFT STENT PLACEMENT;  Surgeon: Sebastian Ache, MD;  Location: [facility];  Service: Urology;  Laterality: Left;   ENDOMETRIAL ABLATION     EYE SURGERY Bilateral    cataract extractions  KYPHOPLASTY N/A 07/23/2016   Procedure: KYPHOPLASTY T11;  Surgeon: Kennedy BuckerMichael Menz, MD;  Location: [facility];  Service: Orthopedics;  Laterality: N/A;   KYPHOPLASTY N/A 03/21/2019   Procedure: L3 KYPHOPLASTY;  Surgeon: Kennedy BuckerMenz, Michael, MD;  Location: [facility];  Service: Orthopedics;  Laterality: N/A;   LAPAROSCOPIC CHOLECYSTECTOMY  1998   PUBOVAGINAL SLING  1995   TUBAL LIGATION     UPPER GI ENDOSCOPY     Current Outpatient Medications  Medication Instructions   Azelaic Acid 15 % gel 1 application, Topical, 2 times daily   Bio-Flax 1,000 mg, Oral, Daily   Calcium Carb-Cholecalciferol (CALCIUM 600 + D PO) 2 tablets, Oral, Daily   latanoprost (XALATAN) 0.005 % ophthalmic  solution 1 drop, Both Eyes, Daily at bedtime   mirabegron ER (MYRBETRIQ) 25 mg, Oral, Daily   NEXIUM 40 MG capsule TAKE ONE CAPSULE BY MOUTH ONCE A DAY BEFORE BREAKFAST   senna-docusate (SENOKOT-S) 8.6-50 MG per tablet 1 tablet, Oral, 2 times daily, While taking pain meds to prevent contipation   timolol (TIMOPTIC) 0.5 % ophthalmic solution 1 drop, Both Eyes, 2 times daily   vitamin B-12 (CYANOCOBALAMIN) 500 mcg, Oral, Daily    Current Facility-Administered Medications:     stroke: mapping our early stages of recovery book, , Does not apply, Once, Cox, Amy N, DO   acetaminophen (TYLENOL) tablet 650 mg, 650 mg, Oral, Q4H PRN, 650 mg at 02/28/21 0635 **OR** acetaminophen (TYLENOL) 160 MG/5ML solution 650 mg, 650 mg, Per Tube, Q4H PRN **OR** acetaminophen (TYLENOL) suppository 650 mg, 650 mg, Rectal, Q4H PRN, Cox, Amy N, DO   aspirin EC tablet 81 mg, 81 mg, Oral, Daily, Cox, Amy N, DO, 81 mg at 02/28/21 0813   atorvastatin (LIPITOR) tablet 40 mg, 40 mg, Oral, Daily, Dahal, Binaya, MD, 40 mg at 02/28/21 1218   clopidogrel (PLAVIX) tablet 75 mg, 75 mg, Oral, Daily, Dahal, Binaya, MD, 75 mg at 02/28/21 1218   hydrALAZINE (APRESOLINE) injection 5 mg, 5 mg, Intravenous, Q6H PRN, Cox, Amy N, DO   hydrALAZINE (APRESOLINE) tablet 25 mg, 25 mg, Oral, Once, Cox, Amy N, DO   latanoprost (XALATAN) 0.005 % ophthalmic solution 1 drop, 1 drop, Both Eyes, QHS, Dahal, Binaya, MD   pantoprazole (PROTONIX) EC tablet 40 mg, 40 mg, Oral, Daily, Dahal, Binaya, MD, 40 mg at 02/28/21 1218   timolol (TIMOPTIC) 0.5 % ophthalmic solution 1 drop, 1 drop, Both Eyes, BID, Dahal, Binaya, MD, 1 drop at 02/28/21 1606   Family History  Problem Relation Age of Onset   Hypertension Mother    Heart disease Father    Cancer Sister        metastatic liver Ca   Heart disease Brother    Cancer Brother        Lung    Social History:  reports that she has never smoked. She has never used smokeless tobacco. She reports that she  does not drink alcohol and does not use drugs.   Exam: Current vital signs: BP 136/70 (BP Location: Right Arm)   Pulse 63   Temp 98.3 F (36.8 C)   Resp 20   Ht 5\' 2"  (1.575 m)   Wt 91.7 kg   SpO2 96%   BMI 36.98 kg/m  Vital signs in last 24 hours: Temp:  [97.7 F (36.5 C)-98.3 F (36.8 C)] 98.3 F (36.8 C) (07/29 1150) Pulse Rate:  [56-70] 63 (07/29 1150) Resp:  [15-20] 20 (07/29 1150) BP: (115-187)/(44-93) 136/70 (07/29 1150) SpO2:  [96 %-100 %]  96 % (07/29 1150) Weight:  [91.7 kg] 91.7 kg (07/28 1959)   Physical Exam  Constitutional: Appears well-developed and well-nourished.  Psych: Affect appropriate to situation, pleasant and cooperative Eyes: No scleral injection HENT: No oropharyngeal obstruction.  MSK: no joint deformities other than expected arthritic changes.  Cardiovascular: Normal rate and regular rhythm.  Respiratory: Effort normal, non-labored breathing GI: Soft.  No distension. There is no tenderness.  Skin: Warm dry and intact visible skin  Neuro: Mental Status: Patient is awake, alert, oriented to person, place, month, year, and situation. Patient is able to give a clear and coherent history. No signs of aphasia or neglect Cranial Nerves: II: Visual Fields are full though she complains of mild blurriness in some areas still, she is able to count fingers in all 4 quadrants accurately. Pupils are equal, round, and reactive to light.   III,IV, VI: EOMI without ptosis or diploplia.  V: Facial sensation is symmetric to temperature VII: Facial movement is symmetric.  VIII: hearing is intact to voice X: Uvula elevates symmetrically XI: Shoulder shrug is symmetric. XII: tongue is midline without atrophy or fasciculations.  Motor: Tone is normal. Bulk is normal. 5/5 strength was present in all four extremities except mild bilateral hip flexor weakness 4+/5, somewhat pain limited.  Sensory: Sensation is symmetric to light touch in the arms and  legs. Deep Tendon Reflexes: 3+ and symmetric in the biceps, brachioradialis and 2+ in the bilateral patellae.  Plantars: Toes are downgoing bilaterally.  Cerebellar: FNF and HKS are intact bilaterally  NIHSS total 0     I have reviewed labs in epic and the results pertinent to this consultation are:  Basic Metabolic Panel: Recent Labs  Lab 02/27/21 1301 02/28/21 0501  NA 138 140  K 3.8 4.0  CL 105 107  CO2 26 27  GLUCOSE 105* 100*  BUN 22 19  CREATININE 0.84 0.70  CALCIUM 9.5 9.1    CBC: Recent Labs  Lab 02/27/21 1301 02/28/21 0501  WBC 12.7* 10.5  NEUTROABS 7.9*  --   HGB 14.9 13.4  HCT 45.0 39.8  MCV 98.9 95.4  PLT 330 265    Coagulation Studies: Recent Labs    02/27/21 1739  LABPROT 12.6  INR 1.0     Lab Results  Component Value Date   CHOL 226 (H) 02/28/2021   HDL 60 02/28/2021   LDLCALC 132 (H) 02/28/2021   LDLDIRECT 151.5 02/01/2012   TRIG 172 (H) 02/28/2021   CHOLHDL 3.8 02/28/2021   No results found for: HGBA1C  UA negative, UDS neg, EtOH neg  PT/INR/PTT WNL   I have reviewed the images obtained:  Personally reviewed MRI brain, agree with radiology read and note that the appearance is concerning for an embolic phenomenon IMPRESSION: 1. Acute right PCA territory infarcts in the right occipital lobe and the medial right temporal lobe. Mild associated edema without significant mass effect. 2. Mild for age chronic microvascular ischemic disease.   ECHO:  1. Left ventricular ejection fraction, by estimation, is 60 to 65%. The  left ventricle has normal function. Left ventricular endocardial border  not optimally defined to evaluate regional wall motion. Left ventricular  diastolic parameters are indeterminate.  (Notably no left ventricular hypertrophy and normal cavity size)  2. Right ventricular systolic function is normal. The right ventricular  size is normal.   3. The mitral valve is grossly normal. No evidence of mitral valve   regurgitation. No evidence of mitral stenosis.   4. The aortic  valve was not well visualized. Aortic valve regurgitation  is not visualized. No aortic stenosis is present.   5. The inferior vena cava is normal in size with greater than 50%  respiratory variability, suggesting right atrial pressure of 3 mmHg.  Left Atrium: Left atrial size was normal in size.  Right Atrium: Right atrial size was normal in size.   Carotid US: 1. Mild atherosclerotic disease involving the bilateral carotid arteries. Estimated degree of stenosis in the internal carotid arteries is less than 50% bilaterally. 2. Patent vertebral arteries with antegrade flow.  Impression: This is a 80 year old woman with past medical history significant for obesity, hyperlipidemia, presenting with an embolic appearing right PCA territory multifocal stroke of undetermined etiology.  While carotid ultrasound does not show significant flow-limiting atherosclerosis, she does have some atherosclerotic disease.  Will obtain CTA to better clarify extra and intracranial vasculature, as significant posterior circulation stenosis would change duration of dual antiplatelet therapy.  If this is negative for a culprit stenosis, recommend 30-day event monitor on discharge  Recommendations: -CTA head and neck which will be followed up by neurology tomorrow -Agree with atorvastatin 40 mg nightly -Agree with Dual antiplatelet therapy, 21 days versus 90 days pending CTA, followed by lifelong aspirin monotherapy unless indication for anticoagulation is found during which time would discontinue the aspirin -Risk factor modification counseling completed with the patient, consider outpatient sleep study due to her body habitus, neck size and Mallampati score -Long-term blood pressure goal is normotension -Neurology will follow up CTA head and neck and comment on final duration of dual antiplatelet therapy but otherwise will be available on an as-needed  basis going forward.  Please reach out if new questions arise  Brooke Dare MD-PhD [facility] [phone] [facility] coverage for [facility] is from 8 AM to 4 AM in-house and 4 PM to 8 PM by telephone/video. 8 PM to 8 AM emergent questions or overnight urgent questions should be addressed to Teleneurology On-call or [facility] neurohospitalist; contact information can be found on AMION

## 2021-02-28 NOTE — Plan of Care (Signed)
  Problem: Education: Goal: Knowledge of disease or condition will improve Outcome: Progressing Goal: Knowledge of secondary prevention will improve Outcome: Progressing Goal: Knowledge of patient specific risk factors addressed and post discharge goals established will improve Outcome: Progressing   Problem: Health Behavior/Discharge Planning: Goal: Ability to manage health-related needs will improve Outcome: Progressing   Problem: Self-Care: Goal: Ability to participate in self-care as condition permits will improve Outcome: Progressing   Problem: Nutrition: Goal: Risk of aspiration will decrease Outcome: Progressing   Problem: Education: Goal: Knowledge of General Education information will improve Description: Including pain rating scale, medication(s)/side effects and non-pharmacologic comfort measures Outcome: Progressing   Problem: Health Behavior/Discharge Planning: Goal: Ability to manage health-related needs will improve Outcome: Progressing   Problem: Clinical Measurements: Goal: Ability to maintain clinical measurements within normal limits will improve Outcome: Progressing Goal: Respiratory complications will improve Outcome: Progressing   Problem: Pain Managment: Goal: General experience of comfort will improve Outcome: Progressing   Problem: Safety: Goal: Ability to remain free from injury will improve Outcome: Progressing

## 2021-03-01 DIAGNOSIS — I639 Cerebral infarction, unspecified: Secondary | ICD-10-CM | POA: Diagnosis not present

## 2021-03-01 DIAGNOSIS — E538 Deficiency of other specified B group vitamins: Secondary | ICD-10-CM | POA: Diagnosis not present

## 2021-03-01 LAB — HEMOGLOBIN A1C
Hgb A1c MFr Bld: 5.9 % — ABNORMAL HIGH (ref 4.8–5.6)
Mean Plasma Glucose: 123 mg/dL

## 2021-03-01 LAB — PROCALCITONIN: Procalcitonin: <0.1 ng/mL

## 2021-03-01 MED ORDER — CLOPIDOGREL BISULFATE 75 MG PO TABS: 75.0000 mg | ORAL_TABLET | Freq: Every day | ORAL | 0 refills | Status: AC

## 2021-03-01 MED ORDER — ASPIRIN 81 MG PO TBEC: 81.0000 mg | DELAYED_RELEASE_TABLET | Freq: Every day | ORAL | 0 refills | Status: AC

## 2021-03-01 MED ORDER — ATORVASTATIN CALCIUM 20 MG PO TABS: 20.0000 mg | ORAL_TABLET | Freq: Every day | ORAL | 0 refills | Status: DC

## 2021-03-01 NOTE — Progress Notes (Signed)
Patient verbalized understanding of all discharge instructions including follow up appointments and new medications.

## 2021-03-01 NOTE — Discharge Summary (Signed)
Physician Discharge Summary  Tiffany Mcbride FBP:[id number] DOB: Feb 17, 1941 DOA: 02/27/2021  PCP: Tiffany Regulus, MD  Admit date: 02/27/2021 Discharge date: 03/01/2021  Admitted From: Home Discharge disposition: Home   Code Status: Full Code   Discharge Diagnosis:   Principal Problem:   Stroke [facility]) Active Problems:   B12 deficiency   History of compression fracture of spine   Leukocytosis   Hyperlipidemia    Chief Complaint  Patient presents with   Dizziness   Headache   Blurred Vision    Brief narrative: Tiffany Mcbride is a 80 y.o. female with PMH significant for GERD, history of vaginal bleeding approximately 5 to 10 years ago with aspirin use, history of concussion in fall 2021 after a fall. 7/28, patient presented from home with complaint of dizziness, blurred vision, nausea and headache for 3 days. Patient reported that about 2 to 3 days ago patient started developing blurry vision, and double vision, stated with frontal headache without history of trauma, fever and other focal deficits.  In the ED, patient was hemodynamically stable.  Labs unremarkable.  CT head showed probable late acute or subacute small infarct of the right occipital lobe without hemorrhage or mass effect. MRI brain showed an acute right PCA territory infarcts in the right occipital lobe and the medial right temporal lobe with mild associated edema without significant mass effect. CTA head and neck showed acute right PCA occlusion at the proximal right P2 segment. Otherwise wide patency of the major arterial vasculature of the head and neck. No other hemodynamically significant or correctable stenosis Admitted to hospitalist service for further evaluation management of stroke Neurology consultation was obtained  Subjective: Patient was seen and examined this morning.  Not in distress.  Double vision resolved.  Hospital course: Acute right PCA territory stroke -Presented with 2 to 3 days  history of blurry vision, double vision, dizziness-Stroke work-up initiated. -Imaging as above showing acute right PCA territory infarct in the right occipital lobe and medial right temporal lobe. -Per neurology, there is minimal burden of atherosclerotic disease given her age.  Acute right PCA occlusion likely had an embolic obstruction from an unknown source.  Neurology recommended a 30-day monitor.  Discussed with cardiologist Dr. Lady Gary.  Unable to put the monitor in the weekend.  Patient will contact his office on Monday to set up the procedure.  Information given to patient. -Other stroke work-up as below: -HDL 60, LDL 132, A1c 5.9 -Echocardiogram with EF 60 to 65%, normal right ventricular size and systolic function. -carotid duplex showed less than 50% stenosis on both internal carotid arteries. -PT/OT/ST eval obtained.  No follow-up needed. -Per neurology recommendation, patient will take aspirin 81 mg daily for lifelong and also Plavix 75 mg daily for the first 3 weeks.  Lipitor 40 mg daily started as well.  Elevated blood pressure -No history of hypertension.  Blood pressure initially was elevated as high as 187/93, probably related to stroke. -Blood pressure improved now.  No need to start on blood pressure medicine at discharge.  Leukocytosis -WBC count was initially elevated.  No other evidence of infection.  Procalcitonin level normal.  WC count improved this morning. Recent Labs  Lab 02/27/21 1301 02/27/21 1702 02/28/21 0501 03/01/21 0628  WBC 12.7*  --  10.5  --   PROCALCITON  --  <0.10 <0.10 <0.10    Allergies as of 03/01/2021       Reactions   Codeine Hives   Meloxicam Other (See Comments)  Causes dizziness   Sulfa Antibiotics Hives        Medication List     STOP taking these medications    senna-docusate 8.6-50 MG tablet Commonly known as: Senokot-S       TAKE these medications    aspirin 81 MG EC tablet Take 1 tablet (81 mg total) by mouth daily.  Swallow whole. Start taking on: March 02, 2021   atorvastatin 20 MG tablet Commonly known as: LIPITOR Take 1 tablet (20 mg total) by mouth at bedtime.   Azelaic Acid 15 % gel Apply 1 application topically 2 (two) times daily.   Bio-Flax 1000 MG Caps Take 1,000 mg by mouth daily.   CALCIUM 600 + D PO Take 2 tablets by mouth daily.   clopidogrel 75 MG tablet Commonly known as: PLAVIX Take 1 tablet (75 mg total) by mouth daily for 21 days. Start taking on: March 02, 2021   latanoprost 0.005 % ophthalmic solution Commonly known as: XALATAN Place 1 drop into both eyes at bedtime.   mirabegron ER 25 MG Tb24 tablet Commonly known as: MYRBETRIQ Take 25 mg by mouth daily.   NexIUM 40 MG capsule Generic drug: esomeprazole TAKE ONE CAPSULE BY MOUTH ONCE A DAY BEFORE BREAKFAST   timolol 0.5 % ophthalmic solution Commonly known as: TIMOPTIC Place 1 drop into both eyes 2 (two) times daily.   vitamin B-12 500 MCG tablet Commonly known as: CYANOCOBALAMIN Take 500 mcg by mouth daily.        Discharge Instructions:  Diet Recommendation: Cardiac diet   Follow with Primary MD Tiffany Regulus, MD in 7 days   Get CBC/BMP checked in next visit within 1 week by PCP or SNF MD ( we routinely change or add medications that can affect your baseline labs and fluid status, therefore we recommend that you get the mentioned basic workup next visit with your PCP, your PCP may decide not to get them or add new tests based on their clinical decision)  On your next visit with your PCP, please Get Medicines reviewed and adjusted.  Please request your PCP  to go over all Hospital Tests and Procedure/Radiological results at the follow up, please get all Hospital records sent to your Prim MD by signing hospital release before you go home.  Activity: As tolerated with Full fall precautions use walker/cane & assistance as needed  For Heart failure patients - Check your Weight same time everyday,  if you gain over 2 pounds, or you develop in leg swelling, experience more shortness of breath or chest pain, call your Primary MD immediately. Follow Cardiac Low Salt Diet and 1.5 lit/day fluid restriction.  If you have smoked or chewed Tobacco in the last 2 yrs please stop smoking, stop any regular Alcohol  and or any Recreational drug use.  If you experience worsening of your admission symptoms, develop shortness of breath, life threatening emergency, suicidal or homicidal thoughts you must seek medical attention immediately by calling 911 or calling your MD immediately  if symptoms less severe.  You Must read complete instructions/literature along with all the possible adverse reactions/side effects for all the Medicines you take and that have been prescribed to you. Take any new Medicines after you have completely understood and accpet all the possible adverse reactions/side effects.   Do not drive, operate heavy machinery, perform activities at heights, swimming or participation in water activities or provide baby sitting services if your were admitted for syncope or siezures until you have  seen by Primary MD or a Neurologist and advised to do so again.  Do not drive when taking Pain medications.  Do not take more than prescribed Pain, Sleep and Anxiety Medications  Wear Seat belts while driving.   Please note You were cared for by a hospitalist during your hospital stay. If you have any questions about your discharge medications or the care you received while you were in the hospital after you are discharged, you can call the unit and asked to speak with the hospitalist on call if the hospitalist that took care of you is not available. Once you are discharged, your primary care physician will handle any further medical issues. Please note that NO REFILLS for any discharge medications will be authorized once you are discharged, as it is imperative that you return to your primary care physician  (or establish a relationship with a primary care physician if you do not have one) for your aftercare needs so that they can reassess your need for medications and monitor your lab values.    Follow ups:    Follow-up Information     Tiffany Regulus, MD Follow up.   Specialty: Internal Medicine Contact information: [address] [facility] [place] [address] [phone]         Dalia Heading, MD. Call.   Specialty: Cardiology Why: Call the office on Monday 8/1 after 8:30 AM and ask for [facility]. Contact information: [address] [place] [address] [phone]                 Wound care:     Discharge Exam:   Vitals:   02/28/21 1511 02/28/21 2018 03/01/21 0511 03/01/21 0747  BP: (!) 132/42 (!) 149/65 127/65 122/62  Pulse: 64 64 64 69  Resp: Temp: 97.9 F (36.6 C) 97.7 F (36.5 C) 97.8 F (36.6 C) 98 F (36.7 C)  TempSrc:  Oral Oral Oral  SpO2: 98% 97% 97% 98%  Weight:      Height:        Body mass index is 36.98 kg/m.  General exam: Pleasant, elderly Caucasian female.  Not in distress Skin: No rashes, lesions or ulcers. HEENT: Atraumatic, normocephalic, no obvious bleeding Lungs: Clear to auscultation bilaterally CVS: Regular rate and rhythm, no murmur GI/Abd  soft, nontender, nondistended, bowel sound present CNS: Alert, awake monitor x3.  Double vision improved. Psychiatry: Mood appropriate Extremities: No edema, no calf tenderness  Time coordinating discharge: 35 minutes   The results of significant diagnostics from this hospitalization (including imaging, microbiology, ancillary and laboratory) are listed below for reference.    Procedures and Diagnostic Studies:   CT ANGIO HEAD NECK W WO CM  Result Date: 02/28/2021 CLINICAL DATA:  Initial evaluation for stroke. EXAM: CT ANGIOGRAPHY HEAD AND NECK TECHNIQUE: Multidetector CT imaging of the head and neck was performed using the  standard protocol during bolus administration of intravenous contrast. Multiplanar CT image reconstructions and MIPs were obtained to evaluate the vascular anatomy. Carotid stenosis measurements (when applicable) are obtained utilizing NASCET criteria, using the distal internal carotid diameter as the denominator. CONTRAST:  75mL OMNIPAQUE IOHEXOL 350 MG/ML SOLN COMPARISON:  CT and MRI from 02/27/2021. FINDINGS: CT HEAD FINDINGS Brain: Evolving acute to early subacute right PCA distribution infarcts again seen, relatively stable in size and distribution from previous exams. No hemorrhagic transformation or significant regional mass effect. No other new large vessel territory infarct or intracranial  hemorrhage. No mass lesion, mass effect or midline shift. No hydrocephalus or extra-axial fluid collection. Vascular: No hyperdense vessel. Skull: Scalp soft tissues and calvarium within normal limits. Sinuses: Mild left sphenoid sinus disease. Visualized paranasal sinuses and mastoid air cells are otherwise clear. Orbits: Globes and orbital soft tissues demonstrate no acute finding. Review of the MIP images confirms the above findings CTA NECK FINDINGS Aortic arch: Visualized aortic arch normal caliber with normal branch pattern. No stenosis about the origin of the great vessels. Right carotid system: Right common and internal carotid arteries patent without significant stenosis, dissection or occlusion. Minimal plaque about the proximal cervical right ICA without stenosis. Left carotid system: Left common and internal carotid arteries patent without stenosis, dissection or occlusion. Minimal plaque about the proximal cervical left ICA without stenosis. Vertebral arteries: Both vertebral arteries arise from the subclavian arteries. Strongly dominant left vertebral artery with a diffusely hypoplastic right vertebral artery. No proximal subclavian artery stenosis. Vertebral arteries patent without stenosis, dissection or  occlusion. Skeleton: Mild-to-moderate multilevel cervical spondylosis without significant spinal stenosis. No discrete or worrisome osseous lesions. Degenerative changes noted about the left TMJ. Other neck: No other acute soft tissue abnormality within the neck. No mass or adenopathy. Incidental note made of a 9 mm right thyroid nodule, of doubtful significance given size and patient age, no follow-up imaging recommended (ref: J Am Coll Radiol. 2015 Feb;12(2): 143-50). Upper chest: Visualized upper chest demonstrates no acute finding. Review of the MIP images confirms the above findings CTA HEAD FINDINGS Anterior circulation: Petrous, cavernous, and supraclinoid segments patent without stenosis. A1 segments widely patent. Normal anterior communicating artery complex. Anterior cerebral and middle cerebral arteries widely patent and well perfused bilaterally. Posterior circulation: Dominant left vertebral artery widely patent to the vertebrobasilar junction. Hypoplastic right vertebral artery largely terminates in PICA. Both PICA are patent. Basilar patent to its distal aspect without stenosis. Superior cerebral arteries patent bilaterally. Both PCAs primarily supplied via the basilar. Left PCA widely patent and well perfused to its distal aspect. There is acute occlusion of the right PCA at the proximal right P2 segment (series 11, image 99). Venous sinuses: Patent allowing for timing the contrast bolus. Anatomic variants: None significant. Review of the MIP images confirms the above findings IMPRESSION: CT HEAD IMPRESSION: 1. Continued interval evolution of acute to subacute right PCA distribution infarcts. No evidence for interval hemorrhage or other complication. 2. No other new acute intracranial abnormality. CTA HEAD AND NECK IMPRESSION: 1. Acute right PCA occlusion at the proximal right P2 segment. 2. Otherwise wide patency of the major arterial vasculature of the head and neck. No other hemodynamically  significant or correctable stenosis. Electronically Signed   By: Rise Mu M.D.   On: 02/28/2021 22:12   US Carotid Bilateral  Result Date: 02/28/2021 CLINICAL DATA:  Stroke-like symptoms. EXAM: BILATERAL CAROTID DUPLEX ULTRASOUND TECHNIQUE: Wallace Cullens scale imaging, color Doppler and duplex ultrasound were performed of bilateral carotid and vertebral arteries in the neck. COMPARISON:  None. FINDINGS: Criteria: Quantification of carotid stenosis is based on velocity parameters that correlate the residual internal carotid diameter with NASCET-based stenosis levels, using the diameter of the distal internal carotid lumen as the denominator for stenosis measurement. The following velocity measurements were obtained: RIGHT ICA: 114/27 cm/sec CCA: 119/13 cm/sec SYSTOLIC ICA/CCA RATIO:  1.0 ECA: 178 cm/sec LEFT ICA: 110/20 cm/sec CCA: 113/13 cm/sec SYSTOLIC ICA/CCA RATIO:  1.0 ECA: 108 cm/sec RIGHT CAROTID ARTERY: Small amount of echogenic plaque at the right carotid bulb. External carotid  artery is patent with normal waveform. Small amount of echogenic plaque in the proximal internal carotid artery. Normal waveforms and velocities in the internal carotid artery. RIGHT VERTEBRAL ARTERY: Antegrade flow and normal waveform in the right vertebral artery. LEFT CAROTID ARTERY: External carotid artery is patent with normal waveform. Small amount of plaque in the proximal internal carotid artery. Normal waveforms and velocities in the internal carotid artery. LEFT VERTEBRAL ARTERY: Antegrade flow and normal waveform in the left vertebral artery. IMPRESSION: 1. Mild atherosclerotic disease involving the bilateral carotid arteries. Estimated degree of stenosis in the internal carotid arteries is less than 50% bilaterally. 2. Patent vertebral arteries with antegrade flow. Electronically Signed   By: Richarda Overlie M.D.   On: 02/28/2021 12:02   ECHOCARDIOGRAM COMPLETE  Result Date: 02/28/2021    ECHOCARDIOGRAM REPORT    Patient Name:   Tiffany Mcbride Date of Exam: 02/28/2021 Medical Rec #:  [id number]     Height:       62.0 in Accession #:    [id number]    Weight:       202.2 lb Date of Birth:  07-14-1941    BSA:          1.920 m Patient Age:    79 years      BP:           150/65 mmHg Patient Gender: F             HR:           67 bpm. Exam Location:  [facility] Procedure: 2D Echo, Color Doppler and Cardiac Doppler Indications:     I63.9 Stroke  History:         Patient has no prior history of Echocardiogram examinations.                  Risk Factors:Dyslipidemia.  Sonographer:     Humphrey Rolls RDCS (AE) Referring Phys:  [id number] AMY N COX Diagnosing Phys: Cristal Deer End MD  Sonographer Comments: Suboptimal apical window and no subcostal window. IMPRESSIONS  1. Left ventricular ejection fraction, by estimation, is 60 to 65%. The left ventricle has normal function. Left ventricular endocardial border not optimally defined to evaluate regional wall motion. Left ventricular diastolic parameters are indeterminate.  2. Right ventricular systolic function is normal. The right ventricular size is normal.  3. The mitral valve is grossly normal. No evidence of mitral valve regurgitation. No evidence of mitral stenosis.  4. The aortic valve was not well visualized. Aortic valve regurgitation is not visualized. No aortic stenosis is present.  5. The inferior vena cava is normal in size with greater than 50% respiratory variability, suggesting right atrial pressure of 3 mmHg. FINDINGS  Left Ventricle: Left ventricular ejection fraction, by estimation, is 60 to 65%. The left ventricle has normal function. Left ventricular endocardial border not optimally defined to evaluate regional wall motion. The left ventricular internal cavity size was normal in size. There is no left ventricular hypertrophy. Left ventricular diastolic parameters are indeterminate. Right Ventricle: The right ventricular size is normal. No increase in right ventricular wall  thickness. Right ventricular systolic function is normal. Left Atrium: Left atrial size was normal in size. Right Atrium: Right atrial size was normal in size. Pericardium: The pericardium was not well visualized. Mitral Valve: The mitral valve is grossly normal. No evidence of mitral valve regurgitation. No evidence of mitral valve stenosis. MV peak gradient, 6.2 mmHg. The mean mitral valve gradient is 2.0 mmHg. Tricuspid Valve:  The tricuspid valve is not well visualized. Tricuspid valve regurgitation is not demonstrated. Aortic Valve: The aortic valve was not well visualized. Aortic valve regurgitation is not visualized. No aortic stenosis is present. Aortic valve mean gradient measures 8.0 mmHg. Aortic valve peak gradient measures 17.1 mmHg. Aortic valve area, by VTI measures 2.02 cm. Pulmonic Valve: The pulmonic valve was not well visualized. Pulmonic valve regurgitation is not visualized. No evidence of pulmonic stenosis. Aorta: The aortic root is normal in size and structure. Pulmonary Artery: The pulmonary artery is not well seen. Venous: The inferior vena cava is normal in size with greater than 50% respiratory variability, suggesting right atrial pressure of 3 mmHg. IAS/Shunts: The interatrial septum was not well visualized.  LEFT VENTRICLE PLAX 2D LVIDd:         4.40 cm  Diastology LVIDs:         2.80 cm  LV e' medial:    6.20 cm/s LV PW:         0.90 cm  LV E/e' medial:  13.5 LV IVS:        0.70 cm  LV e' lateral:   9.79 cm/s LVOT diam:     1.80 cm  LV E/e' lateral: 8.6 LV SV:         77 LV SV Index:   40 LVOT Area:     2.54 cm  RIGHT VENTRICLE RV Basal diam:  3.20 cm LEFT ATRIUM           Index       RIGHT ATRIUM           Index LA diam:      3.80 cm 1.98 cm/m  RA Area:     10.40 cm LA Vol (A4C): 51.6 ml 26.87 ml/m RA Volume:   21.30 ml  11.09 ml/m  AORTIC VALVE                    PULMONIC VALVE AV Area (Vmax):    1.79 cm     PV Vmax:       1.23 m/s AV Area (Vmean):   1.96 cm     PV Vmean:       93.600 cm/s AV Area (VTI):     2.02 cm     PV VTI:        0.242 m AV Vmax:           207.00 cm/s  PV Peak grad:  6.1 mmHg AV Vmean:          136.000 cm/s PV Mean grad:  4.0 mmHg AV VTI:            0.381 m AV Peak Grad:      17.1 mmHg AV Mean Grad:      8.0 mmHg LVOT Vmax:         146.00 cm/s LVOT Vmean:        105.000 cm/s LVOT VTI:          0.303 m LVOT/AV VTI ratio: 0.80  AORTA Ao Root diam: 2.50 cm MITRAL VALVE MV Area (PHT): 2.57 cm    SHUNTS MV Area VTI:   3.05 cm    Systemic VTI:  0.30 m MV Peak grad:  6.2 mmHg    Systemic Diam: 1.80 cm MV Mean grad:  2.0 mmHg MV Vmax:       1.25 m/s MV Vmean:      58.2 cm/s MV Decel Time: 295 msec MV E velocity: 84.00  cm/s MV A velocity: 75.00 cm/s MV E/A ratio:  1.12 Cristal Deer End MD Electronically signed by Yvonne Kendall MD Signature Date/Time: 02/28/2021/2:38:42 PM    Final      Labs:   Basic Metabolic Panel: Recent Labs  Lab 02/27/21 1301 02/28/21 0501  NA 138 140  K 3.8 4.0  CL 105 107  CO2 26 27  GLUCOSE 105* 100*  BUN 22 19  CREATININE 0.84 0.70  CALCIUM 9.5 9.1   GFR Estimated Creatinine Clearance: 62 mL/min (by C-G formula based on SCr of 0.7 mg/dL). Liver Function Tests: No results for input(s): AST, ALT, ALKPHOS, BILITOT, PROT, ALBUMIN in the last 168 hours. No results for input(s): LIPASE, AMYLASE in the last 168 hours. No results for input(s): AMMONIA in the last 168 hours. Coagulation profile Recent Labs  Lab 02/27/21 1739  INR 1.0    CBC: Recent Labs  Lab 02/27/21 1301 02/28/21 0501  WBC 12.7* 10.5  NEUTROABS 7.9*  --   HGB 14.9 13.4  HCT 45.0 39.8  MCV 98.9 95.4  PLT 330 265   Cardiac Enzymes: No results for input(s): CKTOTAL, CKMB, CKMBINDEX, TROPONINI in the last 168 hours. BNP: Invalid input(s): POCBNP CBG: No results for input(s): GLUCAP in the last 168 hours. D-Dimer No results for input(s): DDIMER in the last 72 hours. Hgb A1c Recent Labs    02/28/21 0501  HGBA1C 5.9*   Lipid  Profile Recent Labs    02/28/21 0501  CHOL 226*  HDL 60  LDLCALC 132*  TRIG 172*  CHOLHDL 3.8   Thyroid function studies No results for input(s): TSH, T4TOTAL, T3FREE, THYROIDAB in the last 72 hours.  Invalid input(s): FREET3 Anemia work up Recent Labs    02/27/21 1739  VITAMINB12 1,139*   Microbiology Recent Results (from the past 240 hour(s))  Resp Panel by RT-PCR (Flu A&B, Covid) Nasopharyngeal Swab     Status: None   Collection Time: 02/27/21  5:02 PM   Specimen: Nasopharyngeal Swab; Nasopharyngeal(NP) swabs in vial transport medium  Result Value Ref Range Status   SARS Coronavirus 2 by RT PCR NEGATIVE NEGATIVE Final    Comment: (NOTE) SARS-CoV-2 target nucleic acids are NOT DETECTED.  The SARS-CoV-2 RNA is generally detectable in upper respiratory specimens during the acute phase of infection. The lowest concentration of SARS-CoV-2 viral copies this assay can detect is 138 copies/mL. A negative result does not preclude SARS-Cov-2 infection and should not be used as the sole basis for treatment or other patient management decisions. A negative result may occur with  improper specimen collection/handling, submission of specimen other than nasopharyngeal swab, presence of viral mutation(s) within the areas targeted by this assay, and inadequate number of viral copies(<138 copies/mL). A negative result must be combined with clinical observations, patient history, and epidemiological information. The expected result is Negative.  Fact Sheet for Patients:  BloggerCourse.com  Fact Sheet for Healthcare Providers:  SeriousBroker.it  This test is no t yet approved or cleared by the [place] FDA and  has been authorized for detection and/or diagnosis of SARS-CoV-2 by FDA under an Emergency Use Authorization (EUA). This EUA will remain  in effect (meaning this test can be used) for the duration of the COVID-19  declaration under Section 564(b)(1) of the Act, 21 U.S.C.section 360bbb-3(b)(1), unless the authorization is terminated  or revoked sooner.       Influenza A by PCR NEGATIVE NEGATIVE Final   Influenza B by PCR NEGATIVE NEGATIVE Final    Comment: (NOTE) The  Xpert Xpress SARS-CoV-2/FLU/RSV plus assay is intended as an aid in the diagnosis of influenza from Nasopharyngeal swab specimens and should not be used as a sole basis for treatment. Nasal washings and aspirates are unacceptable for Xpert Xpress SARS-CoV-2/FLU/RSV testing.  Fact Sheet for Patients: BloggerCourse.com  Fact Sheet for Healthcare Providers: SeriousBroker.it  This test is not yet approved or cleared by the [place] FDA and has been authorized for detection and/or diagnosis of SARS-CoV-2 by FDA under an Emergency Use Authorization (EUA). This EUA will remain in effect (meaning this test can be used) for the duration of the COVID-19 declaration under Section 564(b)(1) of the Act, 21 U.S.C. section 360bbb-3(b)(1), unless the authorization is terminated or revoked.  Performed at [facility], [address] [place] [place], [place] [address]      Signed: Lorin Glass  [facility] 03/01/2021, 9:57 AM

## 2021-03-01 NOTE — Plan of Care (Signed)
CTA personally reviewed  Agree with radiology, there is minimal burden of atherosclerotic disease given her age that she does have a right PCA occlusion which explains her stroke. Therefore stroke etiology at this time is embolic of unknown source (ESUS) Strong recommendation for 30-day event monitor on discharge, if unable to place prior to discharge due to weekend logistics, recommend close follow-up with PCP/cardiology for this study 21-day course of DAPT per CHANCE/POINT trials followed by 81 mg aspirin daily  Brooke Dare MD-PhD [facility] [phone]  [facility] coverage for [facility] is from 8 AM to 4 AM in-house and 4 PM to 8 PM by telephone/video. 8 PM to 8 AM emergent questions or overnight urgent questions should be addressed to Teleneurology On-call or [facility] neurohospitalist; contact information can be found on AMION

## 2021-04-24 ENCOUNTER — Other Ambulatory Visit: Payer: Self-pay | Admitting: Internal Medicine

## 2021-04-24 ENCOUNTER — Ambulatory Visit
Admission: RE | Admit: 2021-04-24 | Discharge: 2021-04-24 | Disposition: A | Discharge status: Home / Self Care | Payer: Medicare Other | Source: Ambulatory Visit | Attending: Internal Medicine | Admitting: Internal Medicine

## 2021-04-24 ENCOUNTER — Other Ambulatory Visit: Payer: Self-pay

## 2021-04-24 ENCOUNTER — Other Ambulatory Visit (HOSPITAL_COMMUNITY): Payer: Self-pay | Admitting: Internal Medicine

## 2021-04-24 DIAGNOSIS — Z8673 Personal history of transient ischemic attack (TIA), and cerebral infarction without residual deficits: Secondary | ICD-10-CM | POA: Insufficient documentation

## 2021-04-24 DIAGNOSIS — R42 Dizziness and giddiness: Secondary | ICD-10-CM | POA: Insufficient documentation

## 2021-05-30 ENCOUNTER — Ambulatory Visit: Payer: Medicare Other | Admitting: Podiatry

## 2021-06-03 ENCOUNTER — Other Ambulatory Visit: Payer: Self-pay

## 2021-06-03 ENCOUNTER — Ambulatory Visit (INDEPENDENT_AMBULATORY_CARE_PROVIDER_SITE_OTHER): Payer: Medicare Other | Admitting: Podiatry

## 2021-06-03 ENCOUNTER — Encounter (INDEPENDENT_AMBULATORY_CARE_PROVIDER_SITE_OTHER): Payer: Self-pay

## 2021-06-03 DIAGNOSIS — L989 Disorder of the skin and subcutaneous tissue, unspecified: Secondary | ICD-10-CM | POA: Diagnosis not present

## 2021-06-03 DIAGNOSIS — M79674 Pain in right toe(s): Secondary | ICD-10-CM

## 2021-06-03 DIAGNOSIS — B351 Tinea unguium: Secondary | ICD-10-CM | POA: Diagnosis not present

## 2021-06-03 DIAGNOSIS — M79675 Pain in left toe(s): Secondary | ICD-10-CM | POA: Diagnosis not present

## 2021-06-03 NOTE — Progress Notes (Signed)
   SUBJECTIVE Patient presents to office today complaining of elongated, thickened nails that cause pain while ambulating in shoes. She is unable to trim her own nails.   Past Medical History:  Diagnosis Date   Arthritis    At high risk for falls    GERD (gastroesophageal reflux disease)    Glaucoma of both eyes    Heart murmur    wen she was in her 30's   History of gastric ulcer    History of kidney stones    Hyperlipidemia    Left ureteral calculus    Urgency of urination     OBJECTIVE General Patient is awake, alert, and oriented x 3 and in no acute distress. Derm Skin is dry and supple bilateral. Negative open lesions or macerations. Remaining integument unremarkable. Nails are tender, long, thickened and dystrophic with subungual debris, consistent with onychomycosis, 1-5 bilateral. No signs of infection noted.  Hyperkeratotic preulcerative callus tissue also noted to the distal tips of the toes bilateral Vasc  DP and PT pedal pulses palpable bilaterally. Temperature gradient within normal limits.  Neuro Epicritic and protective threshold sensation grossly intact bilaterally.  Musculoskeletal Exam no pedal deformities noted  ASSESSMENT 1.  Pain due to onychomycosis of toenails both 2. Preulcerative callus tissue bilateral feet   PLAN OF CARE 1. Patient evaluated today.  2. Instructed to maintain good pedal hygiene and foot care.  3. Mechanical debridement of nails 1-5 bilaterally performed using a nail nipper. Filed with dremel without incident.  4.  Excisional debridement of the hyperkeratotic preulcerative callus tissue was performed using a tissue nipper without incident or bleeding  5.  Return to clinic in 3 mos.    Felecia Shelling, DPM [facility]  Dr. Felecia Shelling, DPM    [address] [place], [place] [address]                Office [phone]  Fax [phone]

## 2021-08-19 ENCOUNTER — Other Ambulatory Visit: Payer: Self-pay | Admitting: Orthopedic Surgery

## 2021-08-19 DIAGNOSIS — S22060A Wedge compression fracture of T7-T8 vertebra, initial encounter for closed fracture: Secondary | ICD-10-CM

## 2021-08-19 DIAGNOSIS — M549 Dorsalgia, unspecified: Secondary | ICD-10-CM

## 2021-08-21 ENCOUNTER — Other Ambulatory Visit: Payer: Self-pay

## 2021-08-21 ENCOUNTER — Ambulatory Visit
Admission: RE | Admit: 2021-08-21 | Discharge: 2021-08-21 | Disposition: A | Discharge status: Home / Self Care | Payer: Medicare Other | Source: Ambulatory Visit | Attending: Orthopedic Surgery | Admitting: Orthopedic Surgery

## 2021-08-21 DIAGNOSIS — S22060A Wedge compression fracture of T7-T8 vertebra, initial encounter for closed fracture: Secondary | ICD-10-CM | POA: Diagnosis present

## 2021-08-21 DIAGNOSIS — M549 Dorsalgia, unspecified: Secondary | ICD-10-CM | POA: Diagnosis not present

## 2021-08-22 ENCOUNTER — Other Ambulatory Visit: Payer: Self-pay | Admitting: Orthopedic Surgery

## 2021-08-22 DIAGNOSIS — S22000A Wedge compression fracture of unspecified thoracic vertebra, initial encounter for closed fracture: Secondary | ICD-10-CM

## 2021-08-26 ENCOUNTER — Ambulatory Visit
Admission: RE | Admit: 2021-08-26 | Discharge: 2021-08-26 | Disposition: A | Discharge status: Home / Self Care | Payer: Medicare Other | Source: Ambulatory Visit | Attending: Orthopedic Surgery | Admitting: Orthopedic Surgery

## 2021-08-26 DIAGNOSIS — S22000A Wedge compression fracture of unspecified thoracic vertebra, initial encounter for closed fracture: Secondary | ICD-10-CM

## 2021-08-26 HISTORY — PX: IR RADIOLOGIST EVAL & MGMT: IMG5224

## 2021-08-26 NOTE — H&P (Signed)
Interventional Radiology - Clinic Visit, Initial H&P    Referring Provider (current admission): Evon Slack, PA-C  Reason for Visit: Thoracic compression fracture    History of Present Illness  Tiffany Mcbride is a 81 y.o. female with a relevant past medical history of prior compression fractures seen today in Interventional Radiology clinic for new thoracic vertebral body compression fracture.  Patient fell on Jan 10, and had significant back and left sided pain. MRI thoracic spine complete Jan 19 showed new acute fracture with ~20% height loss of the T8 vertebral body. Of note, she had prior fractures of T11 and L3 treated with VP/KP, most recently in Oct 2020. She has tolerated these past procedures well without issue, and they have significantly improved her pain.   Her current pain is severe (10/10) and is requiring both Rx and OTC pain medications. It prevents her from walking and sleeping comfortably, and is mostly limited to her recliner since the fall.    Additional Past Medical History Past Medical History:  Diagnosis Date   Arthritis    At high risk for falls    GERD (gastroesophageal reflux disease)    Glaucoma of both eyes    Heart murmur    wen she was in her 30's   History of gastric ulcer    History of kidney stones    Hyperlipidemia    Left ureteral calculus    Urgency of urination      Surgical History  Past Surgical History:  Procedure Laterality Date   BACK SURGERY  07/2016   kyphoplasty   CERVICAL SPINE SURGERY  1992   posterior diskectomy   COLONOSCOPY     CYSTOSCOPY WITH RETROGRADE PYELOGRAM, URETEROSCOPY AND STENT PLACEMENT Left 03/09/2013   Procedure: CYSTOSCOPY WITH RETROGRADE PYELOGRAM, URETEROSCOPY AND LEFT STENT PLACEMENT;  Surgeon: Sebastian Ache, MD;  Location: [facility];  Service: Urology;  Laterality: Left;   ENDOMETRIAL ABLATION     EYE SURGERY Bilateral    cataract extractions   KYPHOPLASTY N/A 07/23/2016   Procedure:  KYPHOPLASTY T11;  Surgeon: Kennedy Bucker, MD;  Location: [facility];  Service: Orthopedics;  Laterality: N/A;   KYPHOPLASTY N/A 03/21/2019   Procedure: L3 KYPHOPLASTY;  Surgeon: Kennedy Bucker, MD;  Location: [facility];  Service: Orthopedics;  Laterality: N/A;   LAPAROSCOPIC CHOLECYSTECTOMY  1998   PUBOVAGINAL SLING  1995   TUBAL LIGATION     UPPER GI ENDOSCOPY       Medications  I have reviewed the current medication list. Refer to chart for details. Current Outpatient Medications  Medication Instructions   aspirin 81 mg, Oral, Daily, Swallow whole.   atorvastatin (LIPITOR) 20 mg, Oral, Daily at bedtime   Azelaic Acid 15 % gel 1 application, Topical, 2 times daily   Bio-Flax 1,000 mg, Oral, Daily   Calcium Carb-Cholecalciferol (CALCIUM 600 + D PO) 2 tablets, Oral, Daily   latanoprost (XALATAN) 0.005 % ophthalmic solution 1 drop, Both Eyes, Daily at bedtime   mirabegron ER (MYRBETRIQ) 25 mg, Oral, Daily   NEXIUM 40 MG capsule TAKE ONE CAPSULE BY MOUTH ONCE A DAY BEFORE BREAKFAST   timolol (TIMOPTIC) 0.5 % ophthalmic solution 1 drop, Both Eyes, 2 times daily   vitamin B-12 (CYANOCOBALAMIN) 500 mcg, Oral, Daily      Allergies Allergies  Allergen Reactions   Codeine Hives   Meloxicam Other (See Comments)    Causes dizziness   Sulfa Antibiotics Hives   Does patient have contrast allergy: No  Physical Exam Current Vitals Temp: 98.3 F (36.8 C) (Temp Source: Oral)   Pulse Rate: 67       BP: (!) 156/79   SpO2: 98 %           There is no height or weight on file to calculate BMI.  General: Alert and answers questions appropriately. HEENT: Normocephalic, atraumatic. Conjunctivae normal without scleral icterus. Cardiac: Regular rate. No dependent edema. Pulmonary: Normal work of breathing. On room air. Abdominal: Soft without distension. Extremities: Normally-formed, well perfused.  Back: Midline tenderness in the mid upper back.    Pertinent Lab Results CBC Latest Ref Rng &  Units 02/28/2021 02/27/2021 12/29/2019  WBC 4.0 - 10.5 K/uL 10.5 12.7(H) 10.0  Hemoglobin 12.0 - 15.0 g/dL 13.4 14.9 14.5  Hematocrit 36.0 - 46.0 % 39.8 45.0 43.1  Platelets 150 - 400 K/uL 265 330 288   CMP Latest Ref Rng & Units 02/28/2021 02/27/2021 12/29/2019  Glucose 70 - 99 mg/dL 100(H) 105(H) 98  BUN 8 - 23 mg/dL 19 22 18   Creatinine 0.44 - 1.00 mg/dL 0.70 0.84 0.85  Sodium 135 - 145 mmol/L 140 138 137  Potassium 3.5 - 5.1 mmol/L 4.0 3.8 3.5  Chloride 98 - 111 mmol/L 107 105 102  CO2 22 - 32 mmol/L 27 26 24   Calcium 8.9 - 10.3 mg/dL 9.1 9.5 9.6  Total Protein 6.5 - 8.1 g/dL - - -  Total Bilirubin 0.3 - 1.2 mg/dL - - -  Alkaline Phos 38 - 126 U/L - - -  AST 15 - 41 U/L - - -  ALT 14 - 54 U/L - - -      Relevant and/or Recent Imaging: MRI 01/19    Assessment & Plan [name] is a 81 y.o. female with a history of prior compression fractures who was referred to IR Clinic by Dr. Rudene Christians in consultation for further evaluation and management of new T8 acute compression fracture.  Given her pain which is severe and lifestyle limiting following her fall, which can be attributed to new compression fracture, she is an appropriate candidate for T8 kyphoplasty. She is agreeable/eager to proceed.   Risk stratification: ASA Classification: ASA 3 - Patient with moderate systemic disease with functional limitations    Pre-procedure planning:  Post-procedure disposition: outpatient at [facility]   Sedation plan: fentanyl and midazolam Medication holds: aspirin 3 day hold Positioning/access site: prone   Labs needed on or before procedure day: CBC  Other: [employer] representative     I spent a total of  40 Minutes in face-to-face in clinical consultation, greater than 50% of which was spent on medical decision-making and counseling/coordinating care for T8 compression fracture and KP.     Albin Felling, MD  Vascular and Interventional Radiology 08/26/2021 11:55 AM

## 2021-08-28 ENCOUNTER — Other Ambulatory Visit: Payer: Self-pay | Admitting: Interventional Radiology

## 2021-08-28 DIAGNOSIS — S22000A Wedge compression fracture of unspecified thoracic vertebra, initial encounter for closed fracture: Secondary | ICD-10-CM

## 2021-08-29 NOTE — Progress Notes (Signed)
Patient on schedule for Kyphoplasty 09/03/2021, called and spoke with patient on phone with pre procedure instructions given. Made aware to be here at 1000, NPO after MN prior to procedure and driver post procedure/recovery/discharge. Stated understanding.

## 2021-09-02 ENCOUNTER — Other Ambulatory Visit: Payer: Self-pay | Admitting: Radiology

## 2021-09-03 ENCOUNTER — Ambulatory Visit
Admission: RE | Admit: 2021-09-03 | Discharge: 2021-09-03 | Disposition: A | Discharge status: Home / Self Care | Payer: Medicare Other | Source: Ambulatory Visit | Attending: Interventional Radiology | Admitting: Interventional Radiology

## 2021-09-03 ENCOUNTER — Other Ambulatory Visit: Payer: Self-pay

## 2021-09-03 ENCOUNTER — Encounter: Payer: Self-pay | Admitting: Radiology

## 2021-09-03 DIAGNOSIS — X58XXXA Exposure to other specified factors, initial encounter: Secondary | ICD-10-CM | POA: Diagnosis not present

## 2021-09-03 DIAGNOSIS — S22000A Wedge compression fracture of unspecified thoracic vertebra, initial encounter for closed fracture: Secondary | ICD-10-CM | POA: Diagnosis present

## 2021-09-03 HISTORY — PX: IR KYPHO THORACIC WITH BONE BIOPSY: IMG5518

## 2021-09-03 LAB — CBC
HCT: 45.2 % (ref 36.0–46.0)
Hemoglobin: 14.7 g/dL (ref 12.0–15.0)
MCH: 31.5 pg (ref 26.0–34.0)
MCHC: 32.5 g/dL (ref 30.0–36.0)
MCV: 97 fL (ref 80.0–100.0)
Platelets: 318 10*3/uL (ref 150–400)
RBC: 4.66 MIL/uL (ref 3.87–5.11)
RDW: 13 % (ref 11.5–15.5)
WBC: 10.6 10*3/uL — ABNORMAL HIGH (ref 4.0–10.5)
nRBC: 0 % (ref 0.0–0.2)

## 2021-09-03 LAB — PROTIME-INR
INR: 0.9 (ref 0.8–1.2)
Prothrombin Time: 12.5 seconds (ref 11.4–15.2)

## 2021-09-03 MED ORDER — LIDOCAINE HCL (PF) 1 % IJ SOLN
INTRAMUSCULAR | Status: AC
  Administered 2021-09-03: 20 mL
  Filled 2021-09-03: qty 30

## 2021-09-03 MED ORDER — MIDAZOLAM HCL 2 MG/2ML IJ SOLN
INTRAMUSCULAR | Status: AC | PRN
  Administered 2021-09-03 (×2): 1 mg via INTRAVENOUS
  Administered 2021-09-03: .5 mg via INTRAVENOUS
  Administered 2021-09-03: 1 mg via INTRAVENOUS

## 2021-09-03 MED ORDER — CEFAZOLIN SODIUM-DEXTROSE 2-4 GM/100ML-% IV SOLN
INTRAVENOUS | Status: AC
  Administered 2021-09-03: 2 g
  Filled 2021-09-03: qty 100

## 2021-09-03 MED ORDER — FENTANYL CITRATE (PF) 100 MCG/2ML IJ SOLN
INTRAMUSCULAR | Status: AC
  Filled 2021-09-03: qty 2

## 2021-09-03 MED ORDER — MIDAZOLAM HCL 2 MG/2ML IJ SOLN
INTRAMUSCULAR | Status: AC
  Filled 2021-09-03: qty 2

## 2021-09-03 MED ORDER — CEFAZOLIN SODIUM-DEXTROSE 2-4 GM/100ML-% IV SOLN
2.0000 g | INTRAVENOUS | Status: DC
  Filled 2021-09-03: qty 100

## 2021-09-03 MED ORDER — FENTANYL CITRATE (PF) 100 MCG/2ML IJ SOLN
INTRAMUSCULAR | Status: AC | PRN
  Administered 2021-09-03 (×5): 25 ug via INTRAVENOUS

## 2021-09-03 MED ORDER — SODIUM CHLORIDE 0.9 % IV SOLN
INTRAVENOUS | Status: DC
  Filled 2021-09-03: qty 1000

## 2021-09-03 NOTE — Procedures (Signed)
Interventional Radiology Procedure Note  Date of Procedure: 09/03/2021  Procedure: T8 kyphoplasty   Findings:  1. Successful T8 kyphoplasty    Complications: No immediate complications noted.   Estimated Blood Loss: minimal  Follow-up and Recommendations: 1. Bedrest 2 hours, first hour flat    Olive Bass, MD  Vascular & Interventional Radiology  09/03/2021 12:44 PM

## 2021-09-03 NOTE — H&P (Signed)
Chief Complaint: Patient was seen in consultation today for symptomatic compression fracture at the request of Pernell Dupre  Referring Physician(s): Pernell Dupre  Supervising Physician: Pernell Dupre  Patient Status: [facility] - Out-pt  History of Present Illness: Tiffany Mcbride is a 81 y.o. female with significant PMHx of recent fall with symptomatic acute thoracic level 8 compression fracture s/p MRI imaging and consult with VIR on 1/19. The patient rates her mid back pain 10/10 today. Patient has had this procedure before with previous compression fractures and has done well. She denies any changes since VIR consult and is here today for her scheduled elective procedure.   The patient has had a H&P performed within the last 30 days, all history, medications, and exam have been reviewed. The patient denies any interval changes since the H&P. She denies any neurological abnormalities in her lower extremities or changes and denies any radicular pain.  The patient denies any current chest pain or shortness of breath. She denies any current blood thinner use, denies any known bleeding or clotting disorder, she has stopped her ASA x 1 week. The patient denies any recent infections, fever or chills. The patient denies any history of sleep apnea or chronic oxygen use. She has no known complications to moderate sedation.    Past Medical History:  Diagnosis Date   Arthritis    At high risk for falls    GERD (gastroesophageal reflux disease)    Glaucoma of both eyes    Heart murmur    wen she was in her 30's   History of gastric ulcer    History of kidney stones    Hyperlipidemia    Left ureteral calculus    Urgency of urination     Past Surgical History:  Procedure Laterality Date   BACK SURGERY  07/2016   kyphoplasty   CERVICAL SPINE SURGERY  1992   posterior diskectomy   COLONOSCOPY     CYSTOSCOPY WITH RETROGRADE PYELOGRAM, URETEROSCOPY AND STENT PLACEMENT Left 03/09/2013    Procedure: CYSTOSCOPY WITH RETROGRADE PYELOGRAM, URETEROSCOPY AND LEFT STENT PLACEMENT;  Surgeon: Sebastian Ache, MD;  Location: [facility];  Service: Urology;  Laterality: Left;   ENDOMETRIAL ABLATION     EYE SURGERY Bilateral    cataract extractions   IR RADIOLOGIST EVAL & MGMT  08/26/2021   KYPHOPLASTY N/A 07/23/2016   Procedure: KYPHOPLASTY T11;  Surgeon: Kennedy Bucker, MD;  Location: [facility];  Service: Orthopedics;  Laterality: N/A;   KYPHOPLASTY N/A 03/21/2019   Procedure: L3 KYPHOPLASTY;  Surgeon: Kennedy Bucker, MD;  Location: [facility];  Service: Orthopedics;  Laterality: N/A;   LAPAROSCOPIC CHOLECYSTECTOMY  1998   PUBOVAGINAL SLING  1995   TUBAL LIGATION     UPPER GI ENDOSCOPY      Allergies: Codeine, Meloxicam, and Sulfa antibiotics  Medications: Prior to Admission medications   Medication Sig Start Date End Date Taking? Authorizing Provider  Calcium Carb-Cholecalciferol (CALCIUM 600 + D PO) Take 2 tablets by mouth daily.   Yes [provider]  Flaxseed, Linseed, (BIO-FLAX) 1000 MG CAPS Take 1,000 mg by mouth daily.   Yes [provider]  latanoprost (XALATAN) 0.005 % ophthalmic solution Place 1 drop into both eyes at bedtime. 07/18/16  Yes [provider]  mirabegron ER (MYRBETRIQ) 25 MG TB24 tablet Take 25 mg by mouth daily.  12/14/17  Yes [provider]  NEXIUM 40 MG capsule TAKE ONE CAPSULE BY MOUTH ONCE A DAY BEFORE BREAKFAST 05/19/13  Yes Sherlene Shams, MD  vitamin B-12 (CYANOCOBALAMIN) 500 MCG tablet Take 500 mcg by mouth daily.   Yes [provider]  aspirin EC 81 MG EC tablet Take 1 tablet (81 mg total) by mouth daily. Swallow whole. 03/02/21 05/31/21  Lorin Glass, MD  atorvastatin (LIPITOR) 20 MG tablet Take 1 tablet (20 mg total) by mouth at bedtime. 03/01/21 05/30/21  Lorin Glass, MD  Azelaic Acid 15 % gel Apply 1 application topically 2 (two) times daily.     [provider]  timolol  (TIMOPTIC) 0.5 % ophthalmic solution Place 1 drop into both eyes 2 (two) times daily. 06/30/16   [provider]     Family History  Problem Relation Age of Onset   Hypertension Mother    Heart disease Father    Cancer Sister        metastatic liver Ca   Heart disease Brother    Cancer Brother        Lung    Social History   Socioeconomic History   Marital status: Single    Spouse name: Not on file   Number of children: Not on file   Years of education: Not on file   Highest education level: Not on file  Occupational History   Occupation: accounting    Comment: retired  Tobacco Use   Smoking status: Never   Smokeless tobacco: Never  Vaping Use   Vaping Use: Never used  Substance and Sexual Activity   Alcohol use: No   Drug use: No   Sexual activity: Not Currently  Other Topics Concern   Not on file  Social History Narrative   Not on file   Social Determinants of Health   Financial Resource Strain: Not on file  Food Insecurity: Not on file  Transportation Needs: Not on file  Physical Activity: Not on file  Stress: Not on file  Social Connections: Not on file   Review of Systems: A 12 point ROS discussed and pertinent positives are indicated in the HPI above.  All other systems are negative.  Review of Systems  Vital Signs: BP (!) 145/89    Pulse 65    Temp 97.6 F (36.4 C) (Oral)    Resp 12    Ht 5' (1.524 m)    Wt 200 lb (90.7 kg)    SpO2 96%    BMI 39.06 kg/m   Physical Exam Constitutional:      Appearance: Normal appearance.  HENT:     Head: Normocephalic and atraumatic.  Cardiovascular:     Rate and Rhythm: Normal rate and regular rhythm.  Pulmonary:     Effort: Pulmonary effort is normal. No respiratory distress.     Breath sounds: Normal breath sounds.  Musculoskeletal:        General: Tenderness present.     Comments: Mid thoracic region   Skin:    General: Skin is warm and dry.  Neurological:     Mental Status: She is alert and  oriented to person, place, and time.    Imaging: MR THORACIC SPINE WO CONTRAST  Result Date: 08/21/2021 CLINICAL DATA:  Larey Seat 1 week ago with thoracic region back pain. EXAM: MRI THORACIC SPINE WITHOUT CONTRAST TECHNIQUE: Multiplanar, multisequence MR imaging of the thoracic spine was performed. No intravenous contrast was administered. COMPARISON:  None. FINDINGS: Alignment:  No malalignment. Vertebrae: Old augmented healed compression deformity at T11. Acute superior endplate fracture at T8 with loss of height of 20%. No retropulsed  bone. This looks like a benign fracture. Cord:  No cord compression or focal cord lesion. Paraspinal and other soft tissues: Negative Disc levels: No significant disc level pathology in the thoracic region. Scattered disc bulges but no disc herniation or compressive narrowing of the canal or foramina. IMPRESSION: Acute superior endplate fracture at T8 with loss of height of 20%. No retropulsed bone. No finding to suggest that this is anything other than a benign fracture. Old healed augmented fracture at T11. Electronically Signed   By: Paulina FusiMark  Shogry M.D.   On: 08/21/2021 15:51   IR Radiologist Eval & Mgmt  Result Date: 08/26/2021 EXAM: NEW PATIENT OFFICE VISIT CHIEF COMPLAINT: Refer to epic note HISTORY OF PRESENT ILLNESS: Refer to epic note REVIEW OF SYSTEMS: Refer to epic note PHYSICAL EXAMINATION: Refer to epic note ASSESSMENT AND PLAN: Refer to epic note Electronically Signed   By: Olive BassYasser  El-Abd M.D.   On: 08/26/2021 12:20    Labs:  CBC: Recent Labs    02/27/21 1301 02/28/21 0501 09/03/21 1039  WBC 12.7* 10.5 10.6*  HGB 14.9 13.4 14.7  HCT 45.0 39.8 45.2  PLT 330 265 318    COAGS: Recent Labs    02/27/21 1739 09/03/21 1039  INR 1.0 0.9  APTT 24  --     BMP: Recent Labs    02/27/21 1301 02/28/21 0501  NA 138 140  K 3.8 4.0  CL 105 107  CO2 26 27  GLUCOSE 105* 100*  BUN 22 19  CALCIUM 9.5 9.1  CREATININE 0.84 0.70  GFRNONAA >60 >60     Assessment and Plan: 81 year old female with significant PMHx of recent fall with symptomatic acute thoracic level 8 compression fracture s/p MRI imaging and consult with VIR on 1/19. Patient has had this procedure before with previous compression fractures and has done well. She denies any changes since VIR consult and is here today for her scheduled elective procedure.   The patient has been NPO, no blood thinners taken- ASA has been held, imaging, labs and vitals have been reviewed.  Risks and benefits of image guided T8 kyphoplasty with moderate sedation was discussed at length during the consult on 1/19 with the patient by Dr. Juliette AlcideEl-Abd including, but not limited to education regarding the natural healing process of compression fractures without intervention, bleeding, infection, cement migration which may cause spinal cord damage, paralysis, or pulmonary embolism.  All of the patient's questions were answered, patient is agreeable to proceed.  Consent signed and in chart.   Thank you for this interesting consult.  I greatly enjoyed meeting Tiffany Mcbride and look forward to participating in their care.  A copy of this report was sent to the requesting provider on this date.  Electronically Signed: Berneta LevinsMORGAN, Courage Biglow D, PA-C 09/03/2021, 11:15 AM   I spent a total of 10 Minutes in face to face in clinical consultation, greater than 50% of which was counseling/coordinating care for symptomatic thoracic level 8 compression fracture.

## 2021-09-05 ENCOUNTER — Ambulatory Visit: Payer: Medicare Other | Admitting: Podiatry

## 2021-09-09 NOTE — Progress Notes (Signed)
Given her pain which is severe and lifestyle limiting following her fall, which can be attributed to new compression fracture, she is an appropriate candidate for T8 kyphoplasty. Pain refractory to prescription pain medication.

## 2021-09-10 ENCOUNTER — Other Ambulatory Visit: Payer: Self-pay | Admitting: Interventional Radiology

## 2021-09-10 DIAGNOSIS — S22000A Wedge compression fracture of unspecified thoracic vertebra, initial encounter for closed fracture: Secondary | ICD-10-CM

## 2021-09-18 ENCOUNTER — Ambulatory Visit
Admission: RE | Admit: 2021-09-18 | Discharge: 2021-09-18 | Disposition: A | Discharge status: Home / Self Care | Payer: Medicare Other | Source: Ambulatory Visit | Attending: Interventional Radiology | Admitting: Interventional Radiology

## 2021-09-18 ENCOUNTER — Other Ambulatory Visit: Payer: Self-pay

## 2021-09-18 DIAGNOSIS — S22000A Wedge compression fracture of unspecified thoracic vertebra, initial encounter for closed fracture: Secondary | ICD-10-CM

## 2021-09-18 HISTORY — PX: IR RADIOLOGIST EVAL & MGMT: IMG5224

## 2021-09-18 NOTE — Progress Notes (Signed)
Interventional Radiology - Telephone Visit    History of Present Illness  Tiffany Mcbride is a 81 y.o. female with a relevant history of T8 compression fracture, seen in telephone visit s/p T8 kyphoplasty on 02/01.  We confirmed identity with 2 personal identifiers.   She reported some initial soreness after there procedure, but now states that the pain that she was having in the upper back is essentially 'gone.' She used to have to sleep in the recliner, but has since been able to move back to her bed and has reported no significant discomfort in the past few nights.   She does report some persistent lower back discomfort that was present before the procedure, which is not significantly changed, and she attributes to generalized deconditioning due to decreased mobility prior to the kyphoplasty.   Otherwise focused ROS negative.     Past medical and surgical history reviewed. No interval changes. No interval hospitalizations.   Medications  I have reviewed the current medication list. Refer to chart for details. Current Outpatient Medications  Medication Instructions   aspirin 81 mg, Oral, Daily, Swallow whole.   atorvastatin (LIPITOR) 20 mg, Oral, Daily at bedtime   Azelaic Acid 15 % gel 1 application, Topical, 2 times daily   Bio-Flax 1,000 mg, Oral, Daily   Calcium Carb-Cholecalciferol (CALCIUM 600 + D PO) 2 tablets, Oral, Daily   latanoprost (XALATAN) 0.005 % ophthalmic solution 1 drop, Both Eyes, Daily at bedtime   mirabegron ER (MYRBETRIQ) 25 mg, Oral, Daily   NEXIUM 40 MG capsule TAKE ONE CAPSULE BY MOUTH ONCE A DAY BEFORE BREAKFAST   timolol (TIMOPTIC) 0.5 % ophthalmic solution 1 drop, Both Eyes, 2 times daily   vitamin B-12 (CYANOCOBALAMIN) 500 mcg, Oral, Daily       Pertinent Lab Results CBC Latest Ref Rng & Units 09/03/2021 02/28/2021 02/27/2021  WBC 4.0 - 10.5 K/uL 10.6(H) 10.5 12.7(H)  Hemoglobin 12.0 - 15.0 g/dL 14.7 13.4 14.9  Hematocrit 36.0 - 46.0 % 45.2 39.8 45.0   Platelets 150 - 400 K/uL 318 265 330   CMP Latest Ref Rng & Units 02/28/2021 02/27/2021 12/29/2019  Glucose 70 - 99 mg/dL 100(H) 105(H) 98  BUN 8 - 23 mg/dL 19 22 18   Creatinine 0.44 - 1.00 mg/dL 0.70 0.84 0.85  Sodium 135 - 145 mmol/L 140 138 137  Potassium 3.5 - 5.1 mmol/L 4.0 3.8 3.5  Chloride 98 - 111 mmol/L 107 105 102  CO2 22 - 32 mmol/L 27 26 24   Calcium 8.9 - 10.3 mg/dL 9.1 9.5 9.6  Total Protein 6.5 - 8.1 g/dL - - -  Total Bilirubin 0.3 - 1.2 mg/dL - - -  Alkaline Phos 38 - 126 U/L - - -  AST 15 - 41 U/L - - -  ALT 14 - 54 U/L - - -     Relevant and/or Recent Imaging: None    Assessment & Plan Tiffany Mcbride is a 81 y.o. female s/p T8 kyphoplasty on 02/01 with near complete resolution of her upper back pain symptoms.    Plan:  1. Follow up PRN     I spent a total of  10 Minutes in face-to-face in clinical consultation, greater than 50% of which was spent on medical decision-making and counseling/coordinating care for T8 kyphoplasty.   Visit type: Audio only (telephone). Audio (no video) only due to patient's lack of internet/smartphone capability. Alternative for in-person consultation at [facility], [address] [place], [place], [place]. This visit type was  conducted due to national recommendations for restrictions regarding the COVID-19 Pandemic (e.g. social distancing).  This format is felt to be most appropriate for this patient at this time.  All issues noted in this document were discussed and addressed.      Tiffany Felling, MD  Vascular and Interventional Radiology 09/18/2021 1:23 PM

## 2021-09-23 ENCOUNTER — Other Ambulatory Visit: Payer: Self-pay

## 2021-09-23 ENCOUNTER — Ambulatory Visit (INDEPENDENT_AMBULATORY_CARE_PROVIDER_SITE_OTHER): Payer: Medicare Other | Admitting: Podiatry

## 2021-09-23 DIAGNOSIS — L989 Disorder of the skin and subcutaneous tissue, unspecified: Secondary | ICD-10-CM

## 2021-09-23 DIAGNOSIS — M79674 Pain in right toe(s): Secondary | ICD-10-CM

## 2021-09-23 DIAGNOSIS — M79675 Pain in left toe(s): Secondary | ICD-10-CM | POA: Diagnosis not present

## 2021-09-23 DIAGNOSIS — B351 Tinea unguium: Secondary | ICD-10-CM

## 2021-09-23 NOTE — Progress Notes (Signed)
° °  SUBJECTIVE Patient presents to office today complaining of elongated, thickened nails that cause pain while ambulating in shoes. She is unable to trim her own nails.   Past Medical History:  Diagnosis Date   Arthritis    At high risk for falls    GERD (gastroesophageal reflux disease)    Glaucoma of both eyes    Heart murmur    wen she was in her 30's   History of gastric ulcer    History of kidney stones    Hyperlipidemia    Left ureteral calculus    Urgency of urination     OBJECTIVE General Patient is awake, alert, and oriented x 3 and in no acute distress. Derm Skin is dry and supple bilateral. Negative open lesions or macerations. Remaining integument unremarkable. Nails are tender, long, thickened and dystrophic with subungual debris, consistent with onychomycosis, 1-5 bilateral. No signs of infection noted.  Hyperkeratotic preulcerative callus tissue also noted to the distal tips of the toes bilateral Vasc  DP and PT pedal pulses palpable bilaterally. Temperature gradient within normal limits.  Neuro Epicritic and protective threshold sensation grossly intact bilaterally.  Musculoskeletal Exam no pedal deformities noted  ASSESSMENT 1.  Pain due to onychomycosis of toenails both 2. Preulcerative callus tissue bilateral feet   PLAN OF CARE 1. Patient evaluated today.  2. Instructed to maintain good pedal hygiene and foot care.  3. Mechanical debridement of nails 1-5 bilaterally performed using a nail nipper. Filed with dremel without incident.  4.  Excisional debridement of the hyperkeratotic preulcerative callus tissue was performed using a tissue nipper without incident or bleeding  5.  Return to clinic in 3 mos.    Jordyne Poehlman M. Esmae Donathan, DPM [facility]  Dr. Idil Maslanka M. Savian Mazon, DPM    [address]                                     [place], [place] [address]                Office [phone]  Fax [phone]     

## 2021-12-23 ENCOUNTER — Ambulatory Visit (INDEPENDENT_AMBULATORY_CARE_PROVIDER_SITE_OTHER): Payer: Medicare Other | Admitting: Podiatry

## 2021-12-23 ENCOUNTER — Encounter: Payer: Self-pay | Admitting: Podiatry

## 2021-12-23 DIAGNOSIS — B351 Tinea unguium: Secondary | ICD-10-CM | POA: Diagnosis not present

## 2021-12-23 DIAGNOSIS — M79675 Pain in left toe(s): Secondary | ICD-10-CM | POA: Diagnosis not present

## 2021-12-23 DIAGNOSIS — M79674 Pain in right toe(s): Secondary | ICD-10-CM

## 2021-12-23 NOTE — Progress Notes (Signed)
   SUBJECTIVE Patient presents to office today complaining of elongated, thickened nails that cause pain while ambulating in shoes.  Patient is unable to trim their own nails. Patient is here for further evaluation and treatment.  Past Medical History:  Diagnosis Date   Arthritis    At high risk for falls    GERD (gastroesophageal reflux disease)    Glaucoma of both eyes    Heart murmur    wen she was in her 30's   History of gastric ulcer    History of kidney stones    Hyperlipidemia    Left ureteral calculus    Urgency of urination     OBJECTIVE General Patient is awake, alert, and oriented x 3 and in no acute distress. Derm Skin is dry and supple bilateral. Negative open lesions or macerations. Remaining integument unremarkable. Nails are tender, long, thickened and dystrophic with subungual debris, consistent with onychomycosis, 1-5 bilateral. No signs of infection noted. Vasc  DP and PT pedal pulses palpable bilaterally. Temperature gradient within normal limits.  Neuro Epicritic and protective threshold sensation grossly intact bilaterally.  Musculoskeletal Exam No symptomatic pedal deformities noted bilateral. Muscular strength within normal limits.  ASSESSMENT 1.  Pain due to onychomycosis of toenails both  PLAN OF CARE 1. Patient evaluated today.  2. Instructed to maintain good pedal hygiene and foot care.  3. Mechanical debridement of nails 1-5 bilaterally performed using a nail nipper. Filed with dremel without incident.  4. Return to clinic in 3 mos.    Kathlene Yano M. Rustyn Conery, DPM [facility]  Dr. Zainah Steven M. Tapanga Ottaway, DPM    [address]                                     [place], [place] [address]                Office [phone]  Fax [phone]      

## 2022-03-27 ENCOUNTER — Ambulatory Visit (INDEPENDENT_AMBULATORY_CARE_PROVIDER_SITE_OTHER): Payer: Medicare Other | Admitting: Podiatry

## 2022-03-27 DIAGNOSIS — M79674 Pain in right toe(s): Secondary | ICD-10-CM

## 2022-03-27 DIAGNOSIS — M79675 Pain in left toe(s): Secondary | ICD-10-CM | POA: Diagnosis not present

## 2022-03-27 DIAGNOSIS — B351 Tinea unguium: Secondary | ICD-10-CM

## 2022-03-27 NOTE — Progress Notes (Signed)
   SUBJECTIVE Patient presents to office today complaining of elongated, thickened nails that cause pain while ambulating in shoes.  Patient is unable to trim their own nails. Patient is here for further evaluation and treatment.  Past Medical History:  Diagnosis Date   Arthritis    At high risk for falls    GERD (gastroesophageal reflux disease)    Glaucoma of both eyes    Heart murmur    wen she was in her 30's   History of gastric ulcer    History of kidney stones    Hyperlipidemia    Left ureteral calculus    Urgency of urination     OBJECTIVE General Patient is awake, alert, and oriented x 3 and in no acute distress. Derm Skin is dry and supple bilateral. Negative open lesions or macerations. Remaining integument unremarkable. Nails are tender, long, thickened and dystrophic with subungual debris, consistent with onychomycosis, 1-5 bilateral. No signs of infection noted. Vasc  DP and PT pedal pulses palpable bilaterally. Temperature gradient within normal limits.  Neuro Epicritic and protective threshold sensation grossly intact bilaterally.  Musculoskeletal Exam No symptomatic pedal deformities noted bilateral. Muscular strength within normal limits.  ASSESSMENT 1.  Pain due to onychomycosis of toenails both  PLAN OF CARE 1. Patient evaluated today.  2. Instructed to maintain good pedal hygiene and foot care.  3. Mechanical debridement of nails 1-5 bilaterally performed using a nail nipper. Filed with dremel without incident.  4. Return to clinic in 3 mos.    Felecia Shelling, DPM [facility]  Dr. Felecia Shelling, DPM    [address] [place], [place] [address]                Office [phone]  Fax [phone]

## 2022-04-20 ENCOUNTER — Inpatient Hospital Stay
Admission: EM | Admit: 2022-04-20 | Discharge: 2022-04-23 | DRG: 179 | Disposition: A | Discharge status: Home / Self Care | Payer: Medicare Other | Attending: Internal Medicine | Admitting: Internal Medicine

## 2022-04-20 ENCOUNTER — Emergency Department: Payer: Medicare Other

## 2022-04-20 ENCOUNTER — Encounter: Payer: Self-pay | Admitting: Intensive Care

## 2022-04-20 ENCOUNTER — Other Ambulatory Visit: Payer: Self-pay

## 2022-04-20 DIAGNOSIS — J208 Acute bronchitis due to other specified organisms: Secondary | ICD-10-CM | POA: Diagnosis present

## 2022-04-20 DIAGNOSIS — R55 Syncope and collapse: Secondary | ICD-10-CM

## 2022-04-20 DIAGNOSIS — Z6836 Body mass index (BMI) 36.0-36.9, adult: Secondary | ICD-10-CM

## 2022-04-20 DIAGNOSIS — Z8673 Personal history of transient ischemic attack (TIA), and cerebral infarction without residual deficits: Secondary | ICD-10-CM

## 2022-04-20 DIAGNOSIS — I10 Essential (primary) hypertension: Secondary | ICD-10-CM | POA: Diagnosis present

## 2022-04-20 DIAGNOSIS — Z882 Allergy status to sulfonamides status: Secondary | ICD-10-CM

## 2022-04-20 DIAGNOSIS — E876 Hypokalemia: Secondary | ICD-10-CM | POA: Diagnosis not present

## 2022-04-20 DIAGNOSIS — Z8249 Family history of ischemic heart disease and other diseases of the circulatory system: Secondary | ICD-10-CM

## 2022-04-20 DIAGNOSIS — Z888 Allergy status to other drugs, medicaments and biological substances status: Secondary | ICD-10-CM

## 2022-04-20 DIAGNOSIS — Z7982 Long term (current) use of aspirin: Secondary | ICD-10-CM

## 2022-04-20 DIAGNOSIS — U071 COVID-19: Secondary | ICD-10-CM | POA: Diagnosis not present

## 2022-04-20 DIAGNOSIS — H6092 Unspecified otitis externa, left ear: Secondary | ICD-10-CM | POA: Diagnosis present

## 2022-04-20 DIAGNOSIS — H409 Unspecified glaucoma: Secondary | ICD-10-CM | POA: Diagnosis present

## 2022-04-20 DIAGNOSIS — E669 Obesity, unspecified: Secondary | ICD-10-CM | POA: Diagnosis present

## 2022-04-20 DIAGNOSIS — Z79899 Other long term (current) drug therapy: Secondary | ICD-10-CM

## 2022-04-20 DIAGNOSIS — E860 Dehydration: Secondary | ICD-10-CM | POA: Diagnosis present

## 2022-04-20 DIAGNOSIS — K219 Gastro-esophageal reflux disease without esophagitis: Secondary | ICD-10-CM | POA: Diagnosis present

## 2022-04-20 DIAGNOSIS — M1990 Unspecified osteoarthritis, unspecified site: Secondary | ICD-10-CM | POA: Diagnosis present

## 2022-04-20 HISTORY — DX: Cerebral infarction, unspecified: I63.9

## 2022-04-20 LAB — CBC
HCT: 43.3 % (ref 36.0–46.0)
Hemoglobin: 14.1 g/dL (ref 12.0–15.0)
MCH: 31.6 pg (ref 26.0–34.0)
MCHC: 32.6 g/dL (ref 30.0–36.0)
MCV: 97.1 fL (ref 80.0–100.0)
Platelets: 275 10*3/uL (ref 150–400)
RBC: 4.46 MIL/uL (ref 3.87–5.11)
RDW: 13 % (ref 11.5–15.5)
WBC: 12.9 10*3/uL — ABNORMAL HIGH (ref 4.0–10.5)
nRBC: 0 % (ref 0.0–0.2)

## 2022-04-20 LAB — URINALYSIS, ROUTINE W REFLEX MICROSCOPIC
Bacteria, UA: NONE SEEN
Bilirubin Urine: NEGATIVE
Glucose, UA: NEGATIVE mg/dL
Hgb urine dipstick: NEGATIVE
Ketones, ur: 5 mg/dL — AB
Nitrite: NEGATIVE
Protein, ur: 30 mg/dL — AB
Specific Gravity, Urine: 1.019 (ref 1.005–1.030)
pH: 7 (ref 5.0–8.0)

## 2022-04-20 LAB — BASIC METABOLIC PANEL
Anion gap: 9 (ref 5–15)
BUN: 11 mg/dL (ref 8–23)
CO2: 25 mmol/L (ref 22–32)
Calcium: 9.5 mg/dL (ref 8.9–10.3)
Chloride: 108 mmol/L (ref 98–111)
Creatinine, Ser: 0.88 mg/dL (ref 0.44–1.00)
GFR, Estimated: >60 mL/min (ref >60)
Glucose, Bld: 128 mg/dL — ABNORMAL HIGH (ref 70–99)
Potassium: 3.5 mmol/L (ref 3.5–5.1)
Sodium: 142 mmol/L (ref 135–145)

## 2022-04-20 LAB — C-REACTIVE PROTEIN: CRP: 5 mg/dL — ABNORMAL HIGH (ref <1.0)

## 2022-04-20 LAB — FIBRINOGEN: Fibrinogen: 479 mg/dL — ABNORMAL HIGH (ref 210–475)

## 2022-04-20 LAB — FERRITIN: Ferritin: 100 ng/mL (ref 11–307)

## 2022-04-20 LAB — TROPONIN I (HIGH SENSITIVITY): Troponin I (High Sensitivity): 7 ng/L (ref <18)

## 2022-04-20 LAB — PROCALCITONIN: Procalcitonin: <0.1 ng/mL

## 2022-04-20 LAB — D-DIMER, QUANTITATIVE: D-Dimer, Quant: 1.17 ug/mL-FEU — ABNORMAL HIGH (ref 0.00–0.50)

## 2022-04-20 LAB — SARS CORONAVIRUS 2 BY RT PCR: SARS Coronavirus 2 by RT PCR: POSITIVE — AB

## 2022-04-20 LAB — BRAIN NATRIURETIC PEPTIDE: B Natriuretic Peptide: 145.7 pg/mL — ABNORMAL HIGH (ref 0.0–100.0)

## 2022-04-20 LAB — HEPATITIS B SURFACE ANTIGEN: Hepatitis B Surface Ag: NONREACTIVE

## 2022-04-20 LAB — LACTATE DEHYDROGENASE: LDH: 176 U/L (ref 98–192)

## 2022-04-20 MED ORDER — SODIUM CHLORIDE 0.9 % IV BOLUS
500.0000 mL | Freq: Once | INTRAVENOUS | Status: AC
  Administered 2022-04-20: 500 mL via INTRAVENOUS

## 2022-04-20 MED ORDER — MENTHOL 3 MG MT LOZG
1.0000 | LOZENGE | OROMUCOSAL | Status: DC | PRN
  Administered 2022-04-21: 3 mg via ORAL
  Filled 2022-04-20: qty 9

## 2022-04-20 MED ORDER — NIRMATRELVIR/RITONAVIR (PAXLOVID)TABLET
3.0000 | ORAL_TABLET | Freq: Two times a day (BID) | ORAL | Status: DC
  Administered 2022-04-20 – 2022-04-23 (×6): 3 via ORAL
  Filled 2022-04-20: qty 30

## 2022-04-20 MED ORDER — ACETAMINOPHEN 325 MG PO TABS
650.0000 mg | ORAL_TABLET | Freq: Four times a day (QID) | ORAL | Status: DC | PRN
  Administered 2022-04-21 – 2022-04-22 (×3): 650 mg via ORAL
  Filled 2022-04-20 (×3): qty 2

## 2022-04-20 MED ORDER — BENZONATATE 100 MG PO CAPS
100.0000 mg | ORAL_CAPSULE | Freq: Two times a day (BID) | ORAL | Status: DC
  Administered 2022-04-20 – 2022-04-23 (×7): 100 mg via ORAL
  Filled 2022-04-20 (×7): qty 1

## 2022-04-20 MED ORDER — TIMOLOL MALEATE 0.5 % OP SOLN
1.0000 [drp] | Freq: Two times a day (BID) | OPHTHALMIC | Status: DC
  Administered 2022-04-22 – 2022-04-23 (×3): 1 [drp] via OPHTHALMIC
  Filled 2022-04-20: qty 5

## 2022-04-20 MED ORDER — PANTOPRAZOLE SODIUM 40 MG PO TBEC
40.0000 mg | DELAYED_RELEASE_TABLET | Freq: Every day | ORAL | Status: DC
  Administered 2022-04-21 – 2022-04-23 (×3): 40 mg via ORAL
  Filled 2022-04-20 (×3): qty 1

## 2022-04-20 MED ORDER — GUAIFENESIN ER 600 MG PO TB12
600.0000 mg | ORAL_TABLET | Freq: Two times a day (BID) | ORAL | Status: DC
  Administered 2022-04-20 – 2022-04-23 (×7): 600 mg via ORAL
  Filled 2022-04-20 (×7): qty 1

## 2022-04-20 MED ORDER — PREDNISONE 20 MG PO TABS
40.0000 mg | ORAL_TABLET | Freq: Every day | ORAL | Status: DC
  Administered 2022-04-21 – 2022-04-23 (×3): 40 mg via ORAL
  Filled 2022-04-20 (×3): qty 2

## 2022-04-20 MED ORDER — MIRABEGRON ER 25 MG PO TB24
25.0000 mg | ORAL_TABLET | Freq: Every day | ORAL | Status: DC
  Administered 2022-04-21 – 2022-04-23 (×3): 25 mg via ORAL
  Filled 2022-04-20 (×3): qty 1

## 2022-04-20 MED ORDER — AZELAIC ACID 15 % EX GEL: 1.0000 "application " | Freq: Two times a day (BID) | CUTANEOUS | Status: DC

## 2022-04-20 MED ORDER — ENOXAPARIN SODIUM 60 MG/0.6ML IJ SOSY
0.5000 mg/kg | PREFILLED_SYRINGE | INTRAMUSCULAR | Status: DC
  Administered 2022-04-20 – 2022-04-22 (×3): 45 mg via SUBCUTANEOUS
  Filled 2022-04-20 (×3): qty 0.6

## 2022-04-20 MED ORDER — ASPIRIN 81 MG PO TBEC
81.0000 mg | DELAYED_RELEASE_TABLET | Freq: Every day | ORAL | Status: DC
  Administered 2022-04-21 – 2022-04-23 (×3): 81 mg via ORAL
  Filled 2022-04-20 (×3): qty 1

## 2022-04-20 MED ORDER — ATORVASTATIN CALCIUM 20 MG PO TABS: 20.0000 mg | ORAL_TABLET | Freq: Every day | ORAL | Status: DC

## 2022-04-20 MED ORDER — CYANOCOBALAMIN 500 MCG PO TABS
500.0000 ug | ORAL_TABLET | Freq: Every day | ORAL | Status: DC
  Administered 2022-04-21 – 2022-04-23 (×3): 500 ug via ORAL
  Filled 2022-04-20 (×3): qty 1

## 2022-04-20 MED ORDER — IPRATROPIUM-ALBUTEROL 20-100 MCG/ACT IN AERS
1.0000 | INHALATION_SPRAY | Freq: Four times a day (QID) | RESPIRATORY_TRACT | Status: DC
  Administered 2022-04-21 – 2022-04-23 (×8): 1 via RESPIRATORY_TRACT
  Filled 2022-04-20: qty 4

## 2022-04-20 MED ORDER — LATANOPROST 0.005 % OP SOLN
1.0000 [drp] | Freq: Every day | OPHTHALMIC | Status: DC
  Administered 2022-04-22: 1 [drp] via OPHTHALMIC
  Filled 2022-04-20: qty 2.5

## 2022-04-20 MED ORDER — BUTALBITAL-APAP-CAFFEINE 50-325-40 MG PO TABS
1.0000 | ORAL_TABLET | Freq: Four times a day (QID) | ORAL | Status: DC | PRN
  Administered 2022-04-20 – 2022-04-22 (×3): 1 via ORAL
  Filled 2022-04-20 (×3): qty 1

## 2022-04-20 NOTE — H&P (Signed)
History and Physical    Tiffany Mcbride:[id number] DOB: 1940/09/20 DOA: 04/20/2022  PCP: Tiffany Ruths, MD (Confirm with patient/family/NH records and if not entered, this has to be entered at [facility] point of entry) Patient coming from: Home  I have personally briefly reviewed patient's old medical records in [place]  Chief Complaint: Cough, feeling weak and fall  HPI: Tiffany Mcbride is a 81 y.o. female with medical history significant of recent right PCA stroke, HTN, GERD, obesity, presented with multiple complaints of headache, congestion, sore throat, cough, weakness and fall.  Symptoms started 2 days ago with nasal congestion runny nose, sore throat and dry cough which turned to a productive cough yesterday with clear phlegm.  Also developed headache yesterday global, severe with occasional dizziness.  Denies any nauseous vomiting no diarrhea.  She also had episode of subjective fever but no chills.  This morning, patient started to feel generalized weakness, denies any numbness weakness of any of the limbs, when she tried standing up, she felt her legs "so weak" and fell and hit her forehead, no LOC, no loss control of urine or bowel movement.  She was never vaccinated for COVID.  ED Course: Low-grade fever 99.7, no hypoxia, no hypotension.  Chest x-ray clear no acute infiltrates.  COVID-positive.  CT head showed old right PCA infarct.  Patient was started on Paxlovid.  Review of Systems: As per HPI otherwise 14 point review of systems negative.    Past Medical History:  Diagnosis Date   Arthritis    At high risk for falls    GERD (gastroesophageal reflux disease)    Glaucoma of both eyes    Heart murmur    wen she was in her 46's   History of gastric ulcer    History of kidney stones    Hyperlipidemia    Left ureteral calculus    Stroke [facility])    Urgency of urination     Past Surgical History:  Procedure Laterality Date   BACK SURGERY  07/2016    kyphoplasty   CERVICAL SPINE SURGERY  1992   posterior diskectomy   COLONOSCOPY     CYSTOSCOPY WITH RETROGRADE PYELOGRAM, URETEROSCOPY AND STENT PLACEMENT Left 03/09/2013   Procedure: CYSTOSCOPY WITH RETROGRADE PYELOGRAM, URETEROSCOPY AND LEFT STENT PLACEMENT;  Surgeon: Tiffany Frock, MD;  Location: [facility];  Service: Urology;  Laterality: Left;   ENDOMETRIAL ABLATION     EYE SURGERY Bilateral    cataract extractions   IR KYPHO THORACIC WITH BONE BIOPSY  09/03/2021   IR RADIOLOGIST EVAL & MGMT  08/26/2021   IR RADIOLOGIST EVAL & MGMT  09/18/2021   KYPHOPLASTY N/A 07/23/2016   Procedure: KYPHOPLASTY T11;  Surgeon: Tiffany Knows, MD;  Location: [facility];  Service: Orthopedics;  Laterality: N/A;   KYPHOPLASTY N/A 03/21/2019   Procedure: L3 KYPHOPLASTY;  Surgeon: Tiffany Knows, MD;  Location: [facility];  Service: Orthopedics;  Laterality: N/A;   LAPAROSCOPIC CHOLECYSTECTOMY  1998   PUBOVAGINAL SLING  1995   TUBAL LIGATION     UPPER GI ENDOSCOPY       reports that she has never smoked. She has never used smokeless tobacco. She reports that she does not drink alcohol and does not use drugs.  Allergies  Allergen Reactions   Codeine Hives   Meloxicam Other (See Comments)    Causes dizziness   Sulfa Antibiotics Hives    Family History  Problem Relation Age of Onset   Hypertension Mother  Heart disease Father    Cancer Sister        metastatic liver Ca   Heart disease Brother    Cancer Brother        Lung     Prior to Admission medications   Medication Sig Start Date End Date Taking? Authorizing Provider  aspirin EC 81 MG EC tablet Take 1 tablet (81 mg total) by mouth daily. Swallow whole. 03/02/21 05/31/21  Terrilee Croak, MD  atorvastatin (LIPITOR) 20 MG tablet Take 1 tablet (20 mg total) by mouth at bedtime. 03/01/21 05/30/21  Terrilee Croak, MD  Azelaic Acid 15 % gel Apply 1 application topically 2 (two) times daily.     [provider]  Calcium  Carb-Cholecalciferol (CALCIUM 600 + D PO) Take 2 tablets by mouth daily.    [provider]  Flaxseed, Linseed, (BIO-FLAX) 1000 MG CAPS Take 1,000 mg by mouth daily.    [provider]  latanoprost (XALATAN) 0.005 % ophthalmic solution Place 1 drop into both eyes at bedtime. 07/18/16   [provider]  mirabegron ER (MYRBETRIQ) 25 MG TB24 tablet Take 25 mg by mouth daily.  12/14/17   [provider]  NEXIUM 40 MG capsule TAKE ONE CAPSULE BY MOUTH ONCE A DAY BEFORE BREAKFAST 05/19/13   Tiffany Mc, MD  timolol (TIMOPTIC) 0.5 % ophthalmic solution Place 1 drop into both eyes 2 (two) times daily. 06/30/16   [provider]  vitamin B-12 (CYANOCOBALAMIN) 500 MCG tablet Take 500 mcg by mouth daily.    [provider]    Physical Exam: Vitals:   04/20/22 0956 04/20/22 0959 04/20/22 1115 04/20/22 1200  BP: (!) 154/82  (!) 157/64 117/69  Pulse: 92  85 89  Resp: 20  (!) 25 (!) 22  Temp: 99.7 F (37.6 C)     TempSrc: Oral     SpO2: 94%  97% 96%  Weight:  89.8 kg    Height:  5\' 2"  (1.575 m)      Constitutional: NAD, calm, comfortable Vitals:   04/20/22 0956 04/20/22 0959 04/20/22 1115 04/20/22 1200  BP: (!) 154/82  (!) 157/64 117/69  Pulse: 92  85 89  Resp: 20  (!) 25 (!) 22  Temp: 99.7 F (37.6 C)     TempSrc: Oral     SpO2: 94%  97% 96%  Weight:  89.8 kg    Height:  5\' 2"  (1.575 m)     Eyes: PERRL, lids and conjunctivae normal ENMT: Mucous membranes are moist. Posterior pharynx clear of any exudate or lesions.Normal dentition.  Neck: normal, supple, no masses, no thyromegaly Respiratory: Diminished breathing sound bilaterally, scattered wheezing, no crackles. Normal respiratory effort. No accessory muscle use.  Cardiovascular: Regular rate and rhythm, no murmurs / rubs / gallops. No extremity edema. 2+ pedal pulses. No carotid bruits.  Abdomen: no tenderness, no masses palpated. No hepatosplenomegaly. Bowel sounds positive.   Musculoskeletal: no clubbing / cyanosis. No joint deformity upper and lower extremities. Good ROM, no contractures. Normal muscle tone.  Skin: no rashes, lesions, ulcers. No induration Neurologic: CN 2-12 grossly intact. Sensation intact, DTR normal. Strength 5/5 in all 4.  Psychiatric: Normal judgment and insight. Alert and oriented x 3. Normal mood.     Labs on Admission: I have personally reviewed following labs and imaging studies  CBC: Recent Labs  Lab 04/20/22 1011  WBC 12.9*  HGB 14.1  HCT 43.3  MCV 97.1  PLT 123XX123   Basic Metabolic Panel: Recent  Labs  Lab 04/20/22 1011  NA 142  K 3.5  CL 108  CO2 25  GLUCOSE 128*  BUN 11  CREATININE 0.88  CALCIUM 9.5   GFR: Estimated Creatinine Clearance: 54.9 mL/min (by C-G formula based on SCr of 0.88 mg/dL). Liver Function Tests: No results for input(s): "AST", "ALT", "ALKPHOS", "BILITOT", "PROT", "ALBUMIN" in the last 168 hours. No results for input(s): "LIPASE", "AMYLASE" in the last 168 hours. No results for input(s): "AMMONIA" in the last 168 hours. Coagulation Profile: No results for input(s): "INR", "PROTIME" in the last 168 hours. Cardiac Enzymes: No results for input(s): "CKTOTAL", "CKMB", "CKMBINDEX", "TROPONINI" in the last 168 hours. BNP (last 3 results) No results for input(s): "PROBNP" in the last 8760 hours. HbA1C: No results for input(s): "HGBA1C" in the last 72 hours. CBG: No results for input(s): "GLUCAP" in the last 168 hours. Lipid Profile: No results for input(s): "CHOL", "HDL", "LDLCALC", "TRIG", "CHOLHDL", "LDLDIRECT" in the last 72 hours. Thyroid Function Tests: No results for input(s): "TSH", "T4TOTAL", "FREET4", "T3FREE", "THYROIDAB" in the last 72 hours. Anemia Panel: No results for input(s): "VITAMINB12", "FOLATE", "FERRITIN", "TIBC", "IRON", "RETICCTPCT" in the last 72 hours. Urine analysis:    Component Value Date/Time   COLORURINE YELLOW (A) 04/20/2022 1243   APPEARANCEUR HAZY (A)  04/20/2022 1243   APPEARANCEUR Cloudy 02/09/2013 1058   LABSPEC 1.019 04/20/2022 1243   LABSPEC 1.030 02/09/2013 1058   PHURINE 7.0 04/20/2022 1243   GLUCOSEU NEGATIVE 04/20/2022 1243   GLUCOSEU Negative 02/09/2013 1058   HGBUR NEGATIVE 04/20/2022 1243   BILIRUBINUR NEGATIVE 04/20/2022 1243   BILIRUBINUR Negative 02/09/2013 1058   KETONESUR 5 (A) 04/20/2022 1243   PROTEINUR 30 (A) 04/20/2022 1243   UROBILINOGEN 0.2 02/08/2012 1558   NITRITE NEGATIVE 04/20/2022 1243   LEUKOCYTESUR SMALL (A) 04/20/2022 1243   LEUKOCYTESUR 1+ 02/09/2013 1058    Radiological Exams on Admission: DG Chest 2 View  Result Date: 04/20/2022 CLINICAL DATA:  Cough, recent fall EXAM: CHEST - 2 VIEW COMPARISON:  Previous studies including the examination of 02/27/2021 FINDINGS: Cardiac size is within normal limits. There are no signs of pulmonary edema or focal pulmonary consolidation. There is no pleural effusion or pneumothorax. There is evidence vertebroplasty in 2 of the lower thoracic vertebral bodies. IMPRESSION: There are no signs of pulmonary edema or focal pulmonary consolidation. Electronically Signed   By: Elmer Picker M.D.   On: 04/20/2022 11:13   CT HEAD WO CONTRAST  Result Date: 04/20/2022 CLINICAL DATA:  Head trauma, minor (Age >= 65y) EXAM: CT HEAD WITHOUT CONTRAST TECHNIQUE: Contiguous axial images were obtained from the base of the skull through the vertex without intravenous contrast. RADIATION DOSE REDUCTION: This exam was performed according to the departmental dose-optimization program which includes automated exposure control, adjustment of the mA and/or kV according to patient size and/or use of iterative reconstruction technique. COMPARISON:  Head CT 04/24/2021. FINDINGS: Brain: No evidence of acute intracranial hemorrhage or extra-axial collection. Old infarct in the right occipital lobe with associated encephalomalacia. No evidence of new acute infarct.No concerning mass effect.The  ventricles are normal in size. Vascular: Vascular calcifications.  No hyperdense vessel. Skull: Hyperostosis frontalis internus. No evidence of calvarial fracture. Sinuses/Orbits: Minimal ethmoid air cell mucosal thickening. Mastoid air cells are clear. Orbits are unremarkable. Other: None. IMPRESSION: No acute intracranial abnormality. Old right occipital lobe infarct. Electronically Signed   By: Maurine Simmering M.D.   On: 04/20/2022 11:02    EKG: Independently reviewed.  Sinus arrhythmia, no acute ST  changes.  Assessment/Plan Principal Problem:   COVID Active Problems:   COVID-19 virus infection  (please populate well all problems here in Problem List. (For example, if patient is on BP meds at home and you resume or decide to hold them, it is a problem that needs to be her. Same for CAD, COPD, HLD and so on)  Near syncope -Appears to be vasovagal given the prodrome of lightheadedness -Clinically appears to be euvolemic, check orthostatic vital signs -Other DDx, history of CHF, blood pressure stable.  Neurologically, denies any focal deficit neuro exam nonfocal.  Low suspicion for stroke. -PT evaluation  COVID infection -Has symptoms of URI, as well as acute bronchitis, agreed with Paxlovid x5 days. -Supporting treatment, p.o. steroid x5 days -DuoNebs and as needed albuterol.  Headache -Related to the COVID infection, as needed Fioricet.  Hx of recent PCA CVA -Continue aspirin and statin.  GERD -PPI  DVT prophylaxis: Lovenox Code Status: Full code Family Communication: None at bedside Disposition Plan: Expect less than 2 midnight hospital stay Consults called: None Admission status: MedSurg observation   Lequita Halt MD [facility] Pager [phone]  04/20/2022, 1:13 PM

## 2022-04-20 NOTE — ED Triage Notes (Addendum)
Patient arrived by EMS from home. C/O mechanical fall and hit head. Small hematoma to right side. No bleeding. Reports fatigue X3 days, headache, left ear ache and weakness in bilateral legs. Reports left leg worse than right.  Per EMS lungs clear. Patient c/o non productive cough.   Last July mini stroke with left leg deficits. BAseline ambulates with walker  EMS vitals: 172/104 b/p 94% RA 97pulse 20RR 173 CBG

## 2022-04-20 NOTE — ED Notes (Signed)
Pt provided with gingerale per request, lights dimmed per request. No further needs expressed at this time.

## 2022-04-20 NOTE — ED Provider Notes (Signed)
[facility] Provider Note    Event Date/Time   First MD Initiated Contact with Patient 04/20/22 1013     (approximate)   History   Fall   HPI  Tiffany Mcbride is a 81 y.o. female with a history of GERD, hypertension, and CVA who presents with multiple symptoms over the last 1 to 2 days.  Primarily patient reports generalized weakness as well as nonproductive cough with no associated shortness of breath.  She also reports sore throat and left ear pain.  The patient states that today she felt weak enough that when she tried to get up and get to her walker, she fell backwards and hit her head.  She did not lose consciousness.  She denies any other injuries.        Physical Exam   Triage Vital Signs: ED Triage Vitals  Enc Vitals Group     BP 04/20/22 0956 (!) 154/82     Pulse Rate 04/20/22 0956 92     Resp 04/20/22 0956 20     Temp 04/20/22 0956 99.7 F (37.6 C)     Temp Source 04/20/22 0956 Oral     SpO2 04/20/22 0956 94 %     Weight 04/20/22 0959 198 lb (89.8 kg)     Height 04/20/22 0959 5\' 2"  (1.575 m)     Head Circumference --      Peak Flow --      Pain Score 04/20/22 0958 8     Pain Loc --      Pain Edu? --      Excl. in [place]? --     Most recent vital signs: Vitals:   04/20/22 1115 04/20/22 1200  BP: (!) 157/64 117/69  Pulse: 85 89  Resp: (!) 25 (!) 22  Temp:    SpO2: 97% 96%     General: Alert oriented, comfortable appearing. CV:  Good peripheral perfusion.  Resp:  Normal effort.  Abd:  No distention.  Other:  5/5 motor strength and intact sensation of bilateral upper and lower extremities.  No facial droop.  No pronator drift.  No ataxia.  Oropharynx with slight erythema but otherwise clear.  Left TM appears normal.   ED Results / Procedures / Treatments   Labs (all labs ordered are listed, but only abnormal results are displayed) Labs Reviewed  SARS CORONAVIRUS 2 BY RT PCR - Abnormal; Notable for the following components:       Result Value   SARS Coronavirus 2 by RT PCR POSITIVE (*)    All other components within normal limits  BASIC METABOLIC PANEL - Abnormal; Notable for the following components:   Glucose, Bld 128 (*)    All other components within normal limits  CBC - Abnormal; Notable for the following components:   WBC 12.9 (*)    All other components within normal limits  URINALYSIS, ROUTINE W REFLEX MICROSCOPIC  TROPONIN I (HIGH SENSITIVITY)     EKG  ED ECG REPORT I, Arta Silence, the attending physician, personally viewed and interpreted this ECG.  Date: 04/20/2022 EKG Time: 1019 Rate: 74 Rhythm: normal sinus rhythm with PACs QRS Axis: normal Intervals: normal ST/T Wave abnormalities:  nonspecific ST abnormalities Narrative Interpretation: no evidence of acute ischemia; no significant change when compared to EKG of 02/27/2021   RADIOLOGY  CT head: I independently viewed and interpreted the images; there is no ICH  Chest x-ray: No focal consolidation or edema  PROCEDURES:  Critical Care performed:  No  Procedures   MEDICATIONS ORDERED IN ED: Medications  nirmatrelvir/ritonavir EUA (PAXLOVID) 3 tablet (has no administration in time range)  sodium chloride 0.9 % bolus 500 mL (0 mLs Intravenous Stopped 04/20/22 1249)     IMPRESSION / MDM / ASSESSMENT AND PLAN / ED COURSE  I reviewed the triage vital signs and the nursing notes.  81 year old female with PMH as noted above presents with generalized weakness causing her to fall and hit the back of her head as well as nonproductive cough, sore throat, and earache.  I reviewed the past medical records.  The patient was most recently admitted in July 2022.  Per the hospitalist discharge summary she presented with dizziness, blurred vision and nausea.  CT showed late acute or subacute right occipital infarct and the patient had a stroke work-up.  Physical exam is unremarkable for acute findings although the patient does have a  borderline elevated temperature.  Differential diagnosis includes, but is not limited to, COVID 18, other viral syndrome, pneumonia, acute bronchitis, UTI, dehydration, electrolyte abnormality or other metabolic disturbance, less likely primary cardiac cause.  The patient has no symptoms of acute stroke.  We will obtain CT head, chest x-ray, lab work-up, give a fluid bolus, and reassess.   Patient's presentation is most consistent with acute presentation with potential threat to life or bodily function.  The patient is on the cardiac monitor to evaluate for evidence of arrhythmia and/or significant heart rate changes.  ----------------------------------------- 12:58 PM on 04/20/2022 -----------------------------------------  COVID is positive.  Chest x-ray shows no acute findings.  There is mild leukocytosis.  Metabolic panel is normal.  Troponin is negative.  CT head shows no acute findings.  Despite fluids, the patient still remains weak and is unable to get up and walk.  Given this finding we will admit for further management.  I consulted Dr. Roosevelt Locks from the hospitalist service; based on our discussion he agrees to admit the patient.   FINAL CLINICAL IMPRESSION(S) / ED DIAGNOSES   Final diagnoses:  [id number]     Rx / DC Orders   ED Discharge Orders     None        Note:  This document was prepared using Dragon voice recognition software and may include unintentional dictation errors.    Arta Silence, MD 04/20/22 1258

## 2022-04-21 DIAGNOSIS — U071 COVID-19: Secondary | ICD-10-CM | POA: Diagnosis present

## 2022-04-21 DIAGNOSIS — M1990 Unspecified osteoarthritis, unspecified site: Secondary | ICD-10-CM | POA: Diagnosis present

## 2022-04-21 DIAGNOSIS — Z888 Allergy status to other drugs, medicaments and biological substances status: Secondary | ICD-10-CM | POA: Diagnosis not present

## 2022-04-21 DIAGNOSIS — K219 Gastro-esophageal reflux disease without esophagitis: Secondary | ICD-10-CM | POA: Diagnosis present

## 2022-04-21 DIAGNOSIS — Z8249 Family history of ischemic heart disease and other diseases of the circulatory system: Secondary | ICD-10-CM | POA: Diagnosis not present

## 2022-04-21 DIAGNOSIS — Z6836 Body mass index (BMI) 36.0-36.9, adult: Secondary | ICD-10-CM | POA: Diagnosis not present

## 2022-04-21 DIAGNOSIS — J208 Acute bronchitis due to other specified organisms: Secondary | ICD-10-CM | POA: Diagnosis present

## 2022-04-21 DIAGNOSIS — E876 Hypokalemia: Secondary | ICD-10-CM | POA: Diagnosis not present

## 2022-04-21 DIAGNOSIS — E669 Obesity, unspecified: Secondary | ICD-10-CM | POA: Diagnosis present

## 2022-04-21 DIAGNOSIS — Z79899 Other long term (current) drug therapy: Secondary | ICD-10-CM | POA: Diagnosis not present

## 2022-04-21 DIAGNOSIS — E860 Dehydration: Secondary | ICD-10-CM | POA: Diagnosis present

## 2022-04-21 DIAGNOSIS — H409 Unspecified glaucoma: Secondary | ICD-10-CM | POA: Diagnosis present

## 2022-04-21 DIAGNOSIS — Z882 Allergy status to sulfonamides status: Secondary | ICD-10-CM | POA: Diagnosis not present

## 2022-04-21 DIAGNOSIS — Z8673 Personal history of transient ischemic attack (TIA), and cerebral infarction without residual deficits: Secondary | ICD-10-CM | POA: Diagnosis not present

## 2022-04-21 DIAGNOSIS — I10 Essential (primary) hypertension: Secondary | ICD-10-CM | POA: Diagnosis present

## 2022-04-21 DIAGNOSIS — H6092 Unspecified otitis externa, left ear: Secondary | ICD-10-CM | POA: Diagnosis present

## 2022-04-21 DIAGNOSIS — Z7982 Long term (current) use of aspirin: Secondary | ICD-10-CM | POA: Diagnosis not present

## 2022-04-21 DIAGNOSIS — R55 Syncope and collapse: Secondary | ICD-10-CM | POA: Diagnosis not present

## 2022-04-21 LAB — CBC WITH DIFFERENTIAL/PLATELET
Abs Immature Granulocytes: 0.04 10*3/uL (ref 0.00–0.07)
Basophils Absolute: 0 10*3/uL (ref 0.0–0.1)
Basophils Relative: 0 %
Eosinophils Absolute: 0.1 10*3/uL (ref 0.0–0.5)
Eosinophils Relative: 1 %
HCT: 38.4 % (ref 36.0–46.0)
Hemoglobin: 12.5 g/dL (ref 12.0–15.0)
Immature Granulocytes: 1 %
Lymphocytes Relative: 24 %
Lymphs Abs: 2.1 10*3/uL (ref 0.7–4.0)
MCH: 31.2 pg (ref 26.0–34.0)
MCHC: 32.6 g/dL (ref 30.0–36.0)
MCV: 95.8 fL (ref 80.0–100.0)
Monocytes Absolute: 1.2 10*3/uL — ABNORMAL HIGH (ref 0.1–1.0)
Monocytes Relative: 14 %
Neutro Abs: 5.3 10*3/uL (ref 1.7–7.7)
Neutrophils Relative %: 60 %
Platelets: 216 10*3/uL (ref 150–400)
RBC: 4.01 MIL/uL (ref 3.87–5.11)
RDW: 13.1 % (ref 11.5–15.5)
WBC: 8.6 10*3/uL (ref 4.0–10.5)
nRBC: 0 % (ref 0.0–0.2)

## 2022-04-21 LAB — COMPREHENSIVE METABOLIC PANEL
ALT: 23 U/L (ref 0–44)
AST: 26 U/L (ref 15–41)
Albumin: 3.9 g/dL (ref 3.5–5.0)
Alkaline Phosphatase: 58 U/L (ref 38–126)
Anion gap: 8 (ref 5–15)
BUN: 13 mg/dL (ref 8–23)
CO2: 25 mmol/L (ref 22–32)
Calcium: 8.6 mg/dL — ABNORMAL LOW (ref 8.9–10.3)
Chloride: 110 mmol/L (ref 98–111)
Creatinine, Ser: 0.74 mg/dL (ref 0.44–1.00)
GFR, Estimated: >60 mL/min (ref >60)
Glucose, Bld: 104 mg/dL — ABNORMAL HIGH (ref 70–99)
Potassium: 3.1 mmol/L — ABNORMAL LOW (ref 3.5–5.1)
Sodium: 143 mmol/L (ref 135–145)
Total Bilirubin: 0.9 mg/dL (ref 0.3–1.2)
Total Protein: 6.8 g/dL (ref 6.5–8.1)

## 2022-04-21 LAB — MYCOPLASMA PNEUMONIAE ANTIBODY, IGM: Mycoplasma pneumo IgM: <770 U/mL (ref 0–769)

## 2022-04-21 LAB — D-DIMER, QUANTITATIVE: D-Dimer, Quant: 0.91 ug/mL-FEU — ABNORMAL HIGH (ref 0.00–0.50)

## 2022-04-21 LAB — C-REACTIVE PROTEIN: CRP: 7 mg/dL — ABNORMAL HIGH (ref <1.0)

## 2022-04-21 LAB — MAGNESIUM: Magnesium: 1.9 mg/dL (ref 1.7–2.4)

## 2022-04-21 LAB — FERRITIN: Ferritin: 109 ng/mL (ref 11–307)

## 2022-04-21 LAB — PHOSPHORUS: Phosphorus: 2.5 mg/dL (ref 2.5–4.6)

## 2022-04-21 MED ORDER — HYDRALAZINE HCL 20 MG/ML IJ SOLN: 10.0000 mg | INTRAMUSCULAR | Status: DC | PRN

## 2022-04-21 MED ORDER — GUAIFENESIN 100 MG/5ML PO LIQD
5.0000 mL | ORAL | Status: DC | PRN
  Administered 2022-04-21 – 2022-04-22 (×2): 5 mL via ORAL
  Filled 2022-04-21 (×2): qty 10

## 2022-04-21 MED ORDER — TRAZODONE HCL 50 MG PO TABS: 50.0000 mg | ORAL_TABLET | Freq: Every evening | ORAL | Status: DC | PRN

## 2022-04-21 MED ORDER — SENNOSIDES-DOCUSATE SODIUM 8.6-50 MG PO TABS: 1.0000 | ORAL_TABLET | Freq: Every evening | ORAL | Status: DC | PRN

## 2022-04-21 MED ORDER — SODIUM CHLORIDE 0.9 % IV SOLN: INTRAVENOUS | Status: AC

## 2022-04-21 MED ORDER — POTASSIUM CHLORIDE CRYS ER 20 MEQ PO TBCR
30.0000 meq | EXTENDED_RELEASE_TABLET | Freq: Once | ORAL | Status: AC
  Administered 2022-04-21: 30 meq via ORAL
  Filled 2022-04-21: qty 1

## 2022-04-21 MED ORDER — POTASSIUM CHLORIDE CRYS ER 20 MEQ PO TBCR
40.0000 meq | EXTENDED_RELEASE_TABLET | Freq: Once | ORAL | Status: AC
  Administered 2022-04-21: 40 meq via ORAL
  Filled 2022-04-21: qty 2

## 2022-04-21 MED ORDER — METOPROLOL TARTRATE 5 MG/5ML IV SOLN: 5.0000 mg | INTRAVENOUS | Status: DC | PRN

## 2022-04-21 MED ORDER — ALBUTEROL SULFATE HFA 108 (90 BASE) MCG/ACT IN AERS: 1.0000 | INHALATION_SPRAY | RESPIRATORY_TRACT | Status: DC | PRN

## 2022-04-21 MED ORDER — SODIUM CHLORIDE 0.9 % IV BOLUS
500.0000 mL | Freq: Once | INTRAVENOUS | Status: AC
  Administered 2022-04-21: 500 mL via INTRAVENOUS

## 2022-04-21 MED ORDER — MELATONIN 5 MG PO TABS
5.0000 mg | ORAL_TABLET | Freq: Every evening | ORAL | Status: DC | PRN
  Filled 2022-04-21: qty 1

## 2022-04-21 MED ORDER — POTASSIUM CHLORIDE 10 MEQ/100ML IV SOLN
10.0000 meq | INTRAVENOUS | Status: DC
  Administered 2022-04-21: 10 meq via INTRAVENOUS
  Filled 2022-04-21: qty 100

## 2022-04-21 NOTE — Progress Notes (Signed)
PROGRESS NOTE    Tiffany Mcbride  ZDG:[id number] DOB: [birth year] DOA: 04/20/2022 PCP: Lauro Regulus, MD   Brief Narrative:   81 y.o. female with medical history significant of recent right PCA stroke, HTN, GERD, obesity, presented with multiple complaints of headache, congestion, sore throat, cough, weakness and fall.  She was diagnosed of COVID-19 pneumonia and started on Paxlovid.  CT head was negative for any acute infarct.   Assessment & Plan:  Principal Problem:   COVID Active Problems:   COVID-19 virus infection   Near syncope/presyncope -At this time my suspicion is dehydration.  CT of the head is negative.  We will give 500 cc of normal saline bolus followed by maintenance fluid today.   COVID infection -Supportive care.  Elevated CRP. Procal neg. Paxlovid, steroids, bronchodilators   Headache -Likely related to COVID but currently resolved   Hx of recent PCA CVA -Continue home aspirin. On Lipitor at home.    GERD -PPI   DVT prophylaxis: Lovenox Code Status: None Family Communication:    Patient is still feeling weak with dizziness with any kind of mobility and getting out of bed.  Overall feels dehydrated.  Maintain hospital stay for electrolyte repletion, supportive care and IV fluids   Subjective:  During this morning patient states she got quite dizzy when getting up and moving around.  Headache has resolved.  Denies any shortness of breath.  Examination:  General exam: Appears calm and comfortable  Respiratory system: Clear to auscultation. Respiratory effort normal. Cardiovascular system: S1 & S2 heard, RRR. No JVD, murmurs, rubs, gallops or clicks. No pedal edema. Gastrointestinal system: Abdomen is nondistended, soft and nontender. No organomegaly or masses felt. Normal bowel sounds heard. Central nervous system: Alert and oriented. No focal neurological deficits. Extremities: Symmetric 5 x 5 power. Skin: No rashes, lesions or  ulcers Psychiatry: Judgement and insight appear normal. Mood & affect appropriate.     Objective: Vitals:   04/20/22 1740 04/20/22 2100 04/21/22 0500 04/21/22 0955  BP: 107/84 (!) 155/99 136/87 132/85  Pulse: 77 79 75 68  Resp: 18 17 17 15   Temp: 98.9 F (37.2 C) 98.3 F (36.8 C) 98.1 F (36.7 C) 98.2 F (36.8 C)  TempSrc:  Oral Axillary Oral  SpO2: 97% 98% 97% 98%  Weight:      Height:        Intake/Output Summary (Last 24 hours) at 04/21/2022 1119 Last data filed at 04/20/2022 2120 Gross per 24 hour  Intake 500 ml  Output 1 ml  Net 499 ml   Filed Weights   04/20/22 0959  Weight: 89.8 kg     Data Reviewed:   CBC: Recent Labs  Lab 04/20/22 1011 04/21/22 0530  WBC 12.9* 8.6  NEUTROABS  --  5.3  HGB 14.1 12.5  HCT 43.3 38.4  MCV 97.1 95.8  PLT 275 216   Basic Metabolic Panel: Recent Labs  Lab 04/20/22 1011 04/21/22 0530  NA 142 143  K 3.5 3.1*  CL 108 110  CO2 25 25  GLUCOSE 128* 104*  BUN 11 13  CREATININE 0.88 0.74  CALCIUM 9.5 8.6*  MG  --  1.9  PHOS  --  2.5   GFR: Estimated Creatinine Clearance: 60.4 mL/min (by C-G formula based on SCr of 0.74 mg/dL). Liver Function Tests: Recent Labs  Lab 04/21/22 0530  AST 26  ALT 23  ALKPHOS 58  BILITOT 0.9  PROT 6.8  ALBUMIN 3.9   No results for input(s): "LIPASE", "  AMYLASE" in the last 168 hours. No results for input(s): "AMMONIA" in the last 168 hours. Coagulation Profile: No results for input(s): "INR", "PROTIME" in the last 168 hours. Cardiac Enzymes: No results for input(s): "CKTOTAL", "CKMB", "CKMBINDEX", "TROPONINI" in the last 168 hours. BNP (last 3 results) No results for input(s): "PROBNP" in the last 8760 hours. HbA1C: No results for input(s): "HGBA1C" in the last 72 hours. CBG: No results for input(s): "GLUCAP" in the last 168 hours. Lipid Profile: No results for input(s): "CHOL", "HDL", "LDLCALC", "TRIG", "CHOLHDL", "LDLDIRECT" in the last 72 hours. Thyroid Function  Tests: No results for input(s): "TSH", "T4TOTAL", "FREET4", "T3FREE", "THYROIDAB" in the last 72 hours. Anemia Panel: Recent Labs    04/20/22 1011 04/21/22 0530  FERRITIN 100 109   Sepsis Labs: Recent Labs  Lab 04/20/22 1011  PROCALCITON <0.10    Recent Results (from the past 240 hour(s))  SARS Coronavirus 2 by RT PCR (hospital order, performed in [facility] hospital lab) *cepheid single result test* Anterior Nasal Swab     Status: Abnormal   Collection Time: 04/20/22 10:11 AM   Specimen: Anterior Nasal Swab  Result Value Ref Range Status   SARS Coronavirus 2 by RT PCR POSITIVE (A) NEGATIVE Final    Comment: (NOTE) SARS-CoV-2 target nucleic acids are DETECTED  SARS-CoV-2 RNA is generally detectable in upper respiratory specimens  during the acute phase of infection.  Positive results are indicative  of the presence of the identified virus, but do not rule out bacterial infection or co-infection with other pathogens not detected by the test.  Clinical correlation with patient history and  other diagnostic information is necessary to determine patient infection status.  The expected result is negative.  Fact Sheet for Patients:   RoadLapTop.co.za   Fact Sheet for Healthcare Providers:   http://kim-miller.com/    This test is not yet approved or cleared by the [place] FDA and  has been authorized for detection and/or diagnosis of SARS-CoV-2 by FDA under an Emergency Use Authorization (EUA).  This EUA will remain in effect (meaning this test can be used) for the duration of  the COVID-19 declaration under Section 564(b)(1)  of the Act, 21 U.S.C. section 360-bbb-3(b)(1), unless the authorization is terminated or revoked sooner.   Performed at [facility], [address] [place], [place] [address]          Radiology Studies: DG Chest 2 View  Result Date: 04/20/2022 CLINICAL DATA:  Cough, recent fall  EXAM: CHEST - 2 VIEW COMPARISON:  Previous studies including the examination of 02/27/2021 FINDINGS: Cardiac size is within normal limits. There are no signs of pulmonary edema or focal pulmonary consolidation. There is no pleural effusion or pneumothorax. There is evidence vertebroplasty in 2 of the lower thoracic vertebral bodies. IMPRESSION: There are no signs of pulmonary edema or focal pulmonary consolidation. Electronically Signed   By: Ernie Avena M.D.   On: 04/20/2022 11:13   CT HEAD WO CONTRAST  Result Date: 04/20/2022 CLINICAL DATA:  Head trauma, minor (Age >= 65y) EXAM: CT HEAD WITHOUT CONTRAST TECHNIQUE: Contiguous axial images were obtained from the base of the skull through the vertex without intravenous contrast. RADIATION DOSE REDUCTION: This exam was performed according to the departmental dose-optimization program which includes automated exposure control, adjustment of the mA and/or kV according to patient size and/or use of iterative reconstruction technique. COMPARISON:  Head CT 04/24/2021. FINDINGS: Brain: No evidence of acute intracranial hemorrhage or extra-axial collection. Old infarct in the  right occipital lobe with associated encephalomalacia. No evidence of new acute infarct.No concerning mass effect.The ventricles are normal in size. Vascular: Vascular calcifications.  No hyperdense vessel. Skull: Hyperostosis frontalis internus. No evidence of calvarial fracture. Sinuses/Orbits: Minimal ethmoid air cell mucosal thickening. Mastoid air cells are clear. Orbits are unremarkable. Other: None. IMPRESSION: No acute intracranial abnormality. Old right occipital lobe infarct. Electronically Signed   By: Maurine Simmering M.D.   On: 04/20/2022 11:02        Scheduled Meds:  aspirin EC  81 mg Oral Daily   benzonatate  100 mg Oral BID   cyanocobalamin  500 mcg Oral Daily   enoxaparin (LOVENOX) injection  0.5 mg/kg Subcutaneous Q24H   guaiFENesin  600 mg Oral BID    Ipratropium-Albuterol  1 puff Inhalation Q6H   latanoprost  1 drop Both Eyes QHS   mirabegron ER  25 mg Oral Daily   nirmatrelvir/ritonavir EUA  3 tablet Oral BID   pantoprazole  40 mg Oral Daily   potassium chloride  30 mEq Oral Once   potassium chloride  40 mEq Oral Once   predniSONE  40 mg Oral Q breakfast   timolol  1 drop Both Eyes BID   Continuous Infusions:  sodium chloride     sodium chloride       LOS: 0 days   Time spent= 35 mins    Wilman Tucker Arsenio Loader, MD [facility]  If 7PM-7AM, please contact night-coverage  04/21/2022, 11:19 AM

## 2022-04-21 NOTE — Evaluation (Signed)
Physical Therapy Evaluation Patient Details Name: Tiffany Mcbride MRN: [id number] DOB: 1941-07-15 Today's Date: 04/21/2022  History of Present Illness  81 y.o. female with medical history significant ~1 year s/p right PCA stroke, HTN, GERD, obesity, presented with multiple complaints of headache, congestion, sore throat, cough, weakness and fall.  On arrival Covid +  Clinical Impression  Pt pleasant and doing relatively well with mobility.  She reports some general fatigue/weakness but was able to do multiple in room ambulation loops with different amounts of UE support.  She was safe and showed reasonable confidence and tolerance for in-home activity.  She reports she will not have 24/7 assist but feels confident about her ability to manage without PT follow up at home.  Agree that she will be safe to return home when medically ready for d/c, will maintain on PT caseload to insure she does, indeed, work back to baseline congruent function while she is here.     Recommendations for follow up therapy are one component of a multi-disciplinary discharge planning process, led by the attending physician.  Recommendations may be updated based on patient status, additional functional criteria and insurance authorization.  Follow Up Recommendations No PT follow up      Assistance Recommended at Discharge Intermittent Supervision/Assistance  Patient can return home with the following  Assistance with cooking/housework;Assist for transportation    Equipment Recommendations None recommended by PT  Recommendations for Other Services       Functional Status Assessment Patient has had a recent decline in their functional status and demonstrates the ability to make significant improvements in function in a reasonable and predictable amount of time.     Precautions / Restrictions Precautions Precautions: None Restrictions Weight Bearing Restrictions: No      Mobility  Bed Mobility Overal bed  mobility: Independent                  Transfers Overall transfer level: Modified independent Equipment used: 1 person hand held assist               General transfer comment: able to rise to standing w/o hesitation or safety concerns    Ambulation/Gait Ambulation/Gait assistance: Supervision Gait Distance (Feet): 100 Feet Assistive device: Rollator (4 wheels), 1 person hand held assist         General Gait Details: multiple in-room loops with single HHA and 4WW, pt with more confidence with AD, but no LOBs or overt safety issues with either set up.  Pt moves with confidence and reports feeling weak but close to her baseline regarding mobility  Stairs            Wheelchair Mobility    Modified Rankin (Stroke Patients Only)       Balance Overall balance assessment: Modified Independent                                           Pertinent Vitals/Pain Pain Assessment Pain Assessment: No/denies pain    Home Living Family/patient expects to be discharged to:: Private residence Living Arrangements: Other (Comment) Available Help at Discharge: Family;Available PRN/intermittently   Home Access: Level entry       Home Layout: One level Home Equipment: Rollator (4 wheels);Cane - single point      Prior Function Prior Level of Function : Independent/Modified Independent  Mobility Comments: Pt reports she picks up great granddaughter from school, but does not drive much otherwise.       Hand Dominance        Extremity/Trunk Assessment   Upper Extremity Assessment Upper Extremity Assessment: Overall WFL for tasks assessed    Lower Extremity Assessment Lower Extremity Assessment: Overall WFL for tasks assessed       Communication   Communication: No difficulties  Cognition Arousal/Alertness: Awake/alert Behavior During Therapy: WFL for tasks assessed/performed Overall Cognitive Status: Within Functional  Limits for tasks assessed                                          General Comments      Exercises     Assessment/Plan    PT Assessment Patient needs continued PT services (will maintain on caseload to insure activity and return to baseline, will not likely need PT f/u at discharge)  PT Problem List Decreased activity tolerance;Decreased mobility;Cardiopulmonary status limiting activity;Decreased knowledge of use of DME;Decreased safety awareness       PT Treatment Interventions Gait training;Functional mobility training;Therapeutic activities;Therapeutic exercise;Balance training;Patient/family education    PT Goals (Current goals can be found in the Care Plan section)  Acute Rehab PT Goals Patient Stated Goal: go home PT Goal Formulation: With patient Time For Goal Achievement: 05/04/22 Potential to Achieve Goals: Good    Frequency Min 2X/week     Co-evaluation               AM-PAC PT "6 Clicks" Mobility  Outcome Measure Help needed turning from your back to your side while in a flat bed without using bedrails?: None Help needed moving from lying on your back to sitting on the side of a flat bed without using bedrails?: None Help needed moving to and from a bed to a chair (including a wheelchair)?: None Help needed standing up from a chair using your arms (e.g., wheelchair or bedside chair)?: None Help needed to walk in hospital room?: A Little Help needed climbing 3-5 steps with a railing? : A Little 6 Click Score: 22    End of Session   Activity Tolerance: Patient tolerated treatment well;Patient limited by fatigue Patient left: with chair alarm set;with call bell/phone within reach Nurse Communication: Mobility status PT Visit Diagnosis: Muscle weakness (generalized) (M62.81);History of falling (Z91.81);Difficulty in walking, not elsewhere classified (R26.2)    Time: 0086-7619 PT Time Calculation (min) (ACUTE ONLY): 25 min   Charges:    PT Evaluation $PT Eval Low Complexity: 1 Low PT Treatments $Gait Training: 8-22 mins        Kreg Shropshire, DPT 04/21/2022, 10:38 AM

## 2022-04-21 NOTE — TOC Initial Note (Signed)
Transition of Care [facility]) - Initial/Assessment Note    Patient Details  Name: TIPHANY FAYSON MRN: [id number] Date of Birth: 1941/06/01  Transition [facility]) CM/SW Contact:    Laurena Slimmer, RN Phone Number: 04/21/2022, 4:03 PM  Clinical Narrative:                  Transition of Care [facility]) Screening Note   Patient Details  Name: RUPINDER LIVINGSTON Date of Birth: [birth year]   Transition of [facility]) CM/SW Contact:    Laurena Slimmer, RN Phone Number: 04/21/2022, 4:03 PM    Transition of Care Department [facility]) has reviewed patient and no TOC needs have been identified at this time. We will continue to monitor patient advancement through interdisciplinary progression rounds. If new patient transition needs arise, please place a TOC consult.          Patient Goals and CMS Choice        Expected Discharge Plan and Services                                                Prior Living Arrangements/Services                       Activities of Daily Living Home Assistive Devices/Equipment: Eyeglasses, Gilford Rile (specify type) ADL Screening (condition at time of admission) Patient's cognitive ability adequate to safely complete daily activities?: Yes Is the patient deaf or have difficulty hearing?: No Does the patient have difficulty seeing, even when wearing glasses/contacts?: Yes Does the patient have difficulty concentrating, remembering, or making decisions?: No Patient able to express need for assistance with ADLs?: Yes Does the patient have difficulty dressing or bathing?: Yes Independently performs ADLs?: Yes (appropriate for developmental age) Does the patient have difficulty walking or climbing stairs?: Yes Weakness of Legs: Left Weakness of Arms/Hands: Both  Permission Sought/Granted                  Emotional Assessment              Admission diagnosis:  COVID [U07.1] COVID-19 [U07.1] Patient Active Problem List   Diagnosis  Date Noted   COVID-19 04/21/2022   COVID-19 virus infection 04/20/2022   COVID 04/20/2022   Stroke ([place]) 02/27/2021   Leukocytosis 02/27/2021   Hyperlipidemia 02/27/2021   History of compression fracture of spine 12/10/2016   Mild renal insufficiency 12/10/2016   Postmenopausal 12/10/2016   Pure hypercholesterolemia 12/10/2016   Compression fracture of thoracic vertebra ([place]) 07/16/2016   Low vitamin B12 level 11/28/2014   B12 deficiency 11/28/2013   Hip pain, chronic 03/06/2012   Obesity (BMI 30-39.9) 03/06/2012   Postmenopausal atrophic vaginitis 01/26/2012   Screening for cervical cancer 01/26/2012   Screening for colon cancer 01/26/2012   Malaise 01/26/2012   Glaucoma    Osteoarthritis    DYSLIPIDEMIA 04/11/2007   GERD 04/11/2007   DEPRESSION, HX OF 04/11/2007   PCP:  Kirk Ruths, MD Pharmacy:   [id number] - [place], [place] - [address] [place] [address] Phone: [phone] Fax: [facility] [facility] [place], [place] [address] Phone: [phone] Fax: [phone]     Social Determinants of Health (SDOH) Interventions    Readmission Risk Interventions  No data to display

## 2022-04-21 NOTE — Evaluation (Deleted)
Physical Therapy Evaluation Patient Details Name: Tiffany Mcbride MRN: [id number] DOB: 03/03/1941 Today's Date: 04/21/2022  History of Present Illness  81 y.o. female with medical history significant ~1 year s/p right PCA stroke, HTN, GERD, obesity, presented with multiple complaints of headache, congestion, sore throat, cough, weakness and fall.  On arrival Covid +  Clinical Impression  Pt pleasant and doing relatively well with mobility.  She reports some general fatigue/weakness but was able to do multiple in room ambulation loops with different amounts of UE support.  She was safe and showed reasonable confidence and tolerance for in-home activity.  She reports she will not have 24/7 assist but feels confident about her ability to manage without PT follow up at home.  Agree that she will be safe to return home when medically ready for d/c, will maintain on PT caseload to insure she does, indeed, work back to baseline congruent function while she is here.      Recommendations for follow up therapy are one component of a multi-disciplinary discharge planning process, led by the attending physician.  Recommendations may be updated based on patient status, additional functional criteria and insurance authorization.  Follow Up Recommendations No PT follow up      Assistance Recommended at Discharge Intermittent Supervision/Assistance  Patient can return home with the following  Assistance with cooking/housework;Assist for transportation    Equipment Recommendations None recommended by PT  Recommendations for Other Services       Functional Status Assessment Patient has had a recent decline in their functional status and demonstrates the ability to make significant improvements in function in a reasonable and predictable amount of time.     Precautions / Restrictions Precautions Precautions: None Restrictions Weight Bearing Restrictions: No      Mobility  Bed Mobility Overal bed  mobility: Independent                  Transfers Overall transfer level: Modified independent Equipment used: 1 person hand held assist               General transfer comment: able to rise to standing w/o hesitation or safety concerns    Ambulation/Gait Ambulation/Gait assistance: Supervision Gait Distance (Feet): 100 Feet Assistive device: Rollator (4 wheels), 1 person hand held assist         General Gait Details: multiple in-room loops with single HHA and 4WW, pt with more confidence with AD, but no LOBs or overt safety issues with either set up.  Pt moves with confidence and reports feeling weak but close to her baseline regarding mobility  Stairs            Wheelchair Mobility    Modified Rankin (Stroke Patients Only)       Balance Overall balance assessment: Modified Independent                                           Pertinent Vitals/Pain Pain Assessment Pain Assessment: No/denies pain    Home Living Family/patient expects to be discharged to:: Private residence Living Arrangements: Other (Comment) Available Help at Discharge: Family;Available PRN/intermittently   Home Access: Level entry       Home Layout: One level Home Equipment: Rollator (4 wheels);Cane - single point      Prior Function Prior Level of Function : Independent/Modified Independent  Mobility Comments: Pt reports she picks up great granddaughter from school, but does not drive much otherwise.       Hand Dominance        Extremity/Trunk Assessment   Upper Extremity Assessment Upper Extremity Assessment: Overall WFL for tasks assessed    Lower Extremity Assessment Lower Extremity Assessment: Overall WFL for tasks assessed       Communication   Communication: No difficulties  Cognition Arousal/Alertness: Awake/alert Behavior During Therapy: WFL for tasks assessed/performed Overall Cognitive Status: Within Functional  Limits for tasks assessed                                          General Comments      Exercises     Assessment/Plan    PT Assessment  (will maintain on caseload to insure activity and return to baseline, will not likely need PT f/u at discharge)  PT Problem List         PT Treatment Interventions      PT Goals (Current goals can be found in the Care Plan section)  Acute Rehab PT Goals Patient Stated Goal: go home PT Goal Formulation: With patient Time For Goal Achievement: 05/04/22 Potential to Achieve Goals: Good    Frequency       Co-evaluation               AM-PAC PT "6 Clicks" Mobility  Outcome Measure Help needed turning from your back to your side while in a flat bed without using bedrails?: None Help needed moving from lying on your back to sitting on the side of a flat bed without using bedrails?: None Help needed moving to and from a bed to a chair (including a wheelchair)?: None Help needed standing up from a chair using your arms (e.g., wheelchair or bedside chair)?: None Help needed to walk in hospital room?: A Little Help needed climbing 3-5 steps with a railing? : A Little 6 Click Score: 22    End of Session   Activity Tolerance: Patient tolerated treatment well;Patient limited by fatigue Patient left: with chair alarm set;with call bell/phone within reach Nurse Communication: Mobility status PT Visit Diagnosis: Muscle weakness (generalized) (M62.81);History of falling (Z91.81);Difficulty in walking, not elsewhere classified (R26.2)    Time: 7035-0093 PT Time Calculation (min) (ACUTE ONLY): 25 min   Charges:   PT Evaluation $PT Eval Low Complexity: 1 Low PT Treatments $Gait Training: 8-22 mins        Kreg Shropshire, DPT 04/21/2022, 9:54 AM

## 2022-04-22 DIAGNOSIS — R55 Syncope and collapse: Secondary | ICD-10-CM | POA: Diagnosis not present

## 2022-04-22 DIAGNOSIS — E669 Obesity, unspecified: Secondary | ICD-10-CM | POA: Diagnosis not present

## 2022-04-22 DIAGNOSIS — U071 COVID-19: Secondary | ICD-10-CM | POA: Diagnosis not present

## 2022-04-22 LAB — COMPREHENSIVE METABOLIC PANEL
ALT: 24 U/L (ref 0–44)
AST: 29 U/L (ref 15–41)
Albumin: 3.8 g/dL (ref 3.5–5.0)
Alkaline Phosphatase: 55 U/L (ref 38–126)
Anion gap: 3 — ABNORMAL LOW (ref 5–15)
BUN: 16 mg/dL (ref 8–23)
CO2: 26 mmol/L (ref 22–32)
Calcium: 9.7 mg/dL (ref 8.9–10.3)
Chloride: 113 mmol/L — ABNORMAL HIGH (ref 98–111)
Creatinine, Ser: 0.71 mg/dL (ref 0.44–1.00)
GFR, Estimated: >60 mL/min (ref >60)
Glucose, Bld: 99 mg/dL (ref 70–99)
Potassium: 4.6 mmol/L (ref 3.5–5.1)
Sodium: 142 mmol/L (ref 135–145)
Total Bilirubin: 0.4 mg/dL (ref 0.3–1.2)
Total Protein: 7.3 g/dL (ref 6.5–8.1)

## 2022-04-22 LAB — CBC WITH DIFFERENTIAL/PLATELET
Abs Immature Granulocytes: 0.03 10*3/uL (ref 0.00–0.07)
Basophils Absolute: 0 10*3/uL (ref 0.0–0.1)
Basophils Relative: 0 %
Eosinophils Absolute: 0 10*3/uL (ref 0.0–0.5)
Eosinophils Relative: 0 %
HCT: 40.1 % (ref 36.0–46.0)
Hemoglobin: 13.1 g/dL (ref 12.0–15.0)
Immature Granulocytes: 0 %
Lymphocytes Relative: 26 %
Lymphs Abs: 2.4 10*3/uL (ref 0.7–4.0)
MCH: 32.1 pg (ref 26.0–34.0)
MCHC: 32.7 g/dL (ref 30.0–36.0)
MCV: 98.3 fL (ref 80.0–100.0)
Monocytes Absolute: 1 10*3/uL (ref 0.1–1.0)
Monocytes Relative: 11 %
Neutro Abs: 5.7 10*3/uL (ref 1.7–7.7)
Neutrophils Relative %: 63 %
Platelets: 256 10*3/uL (ref 150–400)
RBC: 4.08 MIL/uL (ref 3.87–5.11)
RDW: 13.2 % (ref 11.5–15.5)
WBC: 9.1 10*3/uL (ref 4.0–10.5)
nRBC: 0 % (ref 0.0–0.2)

## 2022-04-22 LAB — LEGIONELLA PNEUMOPHILA SEROGP 1 UR AG: L. pneumophila Serogp 1 Ur Ag: NEGATIVE

## 2022-04-22 LAB — MAGNESIUM: Magnesium: 2.1 mg/dL (ref 1.7–2.4)

## 2022-04-22 LAB — C-REACTIVE PROTEIN: CRP: 4 mg/dL — ABNORMAL HIGH (ref <1.0)

## 2022-04-22 LAB — PHOSPHORUS: Phosphorus: 2.9 mg/dL (ref 2.5–4.6)

## 2022-04-22 LAB — FERRITIN: Ferritin: 130 ng/mL (ref 11–307)

## 2022-04-22 LAB — D-DIMER, QUANTITATIVE: D-Dimer, Quant: 0.81 ug/mL-FEU — ABNORMAL HIGH (ref 0.00–0.50)

## 2022-04-22 MED ORDER — CIPROFLOXACIN-DEXAMETHASONE 0.3-0.1 % OT SUSP
4.0000 [drp] | Freq: Two times a day (BID) | OTIC | Status: DC
  Administered 2022-04-22 – 2022-04-23 (×2): 4 [drp] via OTIC
  Filled 2022-04-22 (×2): qty 7.5

## 2022-04-22 MED ORDER — MECLIZINE HCL 25 MG PO TABS
25.0000 mg | ORAL_TABLET | Freq: Three times a day (TID) | ORAL | Status: DC | PRN
  Administered 2022-04-22: 25 mg via ORAL
  Filled 2022-04-22 (×4): qty 1

## 2022-04-22 MED ORDER — LACTATED RINGERS IV SOLN: INTRAVENOUS | Status: DC

## 2022-04-22 MED ORDER — OYSTER SHELL CALCIUM/D3 500-5 MG-MCG PO TABS
1.0000 | ORAL_TABLET | Freq: Two times a day (BID) | ORAL | Status: DC
  Administered 2022-04-22 – 2022-04-23 (×2): 1 via ORAL
  Filled 2022-04-22 (×2): qty 1

## 2022-04-22 NOTE — Progress Notes (Signed)
PT Cancellation Note  Patient Details Name: Tiffany Mcbride MRN: [id number] DOB: Aug 13, 1940   Cancelled Treatment:    Reason Eval/Treat Not Completed: Other (comment) Pt was supposed to have d/c's today but per discussion with nurse she had some BP issues and has needed fluids.  Nursing reports pt is still moving around well in the room.  Deferred PT/mobility at this time.    Kreg Shropshire, DPT 04/22/2022, 4:48 PM

## 2022-04-22 NOTE — Hospital Course (Signed)
81 y.o. female with medical history significant of recent right PCA stroke, HTN, GERD, obesity, presented with multiple complaints of headache, congestion, sore throat, cough, weakness and fall.  She was diagnosed of COVID-19 infection and started on Paxlovid.  CT head was negative for any acute infarct.

## 2022-04-22 NOTE — Progress Notes (Signed)
Patient removed IV at 1830. Patient refused a new IV.

## 2022-04-22 NOTE — Progress Notes (Addendum)
  Progress Note   Patient: Tiffany Mcbride [id number] DOB: 1940-11-28 DOA: 04/20/2022     1 DOS: the patient was seen and examined on 04/22/2022   Brief hospital course: 81 y.o. female with medical history significant of recent right PCA stroke, HTN, GERD, obesity, presented with multiple complaints of headache, congestion, sore throat, cough, weakness and fall.  She was diagnosed of COVID-19 infection and started on Paxlovid.  CT head was negative for any acute infarct.  Assessment and Plan: Near syncope. COVID infection. Otitis externa Patient still has significant dizziness, will restart IV fluids. Continue finish up Paxlovid. Cipro eardrops. Patient may be able to discharge tomorrow.  History of CVA. Continue aspirin  Obesity with BMI 36.021. Diet exercise.    Subjective:  Patient still complaining significant headache, right ear pain, sore throat, runny nose. No diarrhea. No abdominal pain nausea vomiting.  Physical Exam: Vitals:   04/22/22 0503 04/22/22 0943 04/22/22 0946 04/22/22 0948  BP: (!) 164/91 (!) 158/72 (!) 157/67 (!) 152/82  Pulse: 70 66 63 71  Resp: 18 18 18 18   Temp: 98.5 F (36.9 C) (!) 97.5 F (36.4 C) (!) 97.5 F (36.4 C) (!) 97.5 F (36.4 C)  TempSrc: Oral Oral    SpO2: 98% 98% 95% 96%  Weight:      Height:       General exam: Appears calm and comfortable  Respiratory system: Clear to auscultation. Respiratory effort normal. Cardiovascular system: S1 & S2 heard, RRR. No JVD, murmurs, rubs, gallops or clicks. No pedal edema. Gastrointestinal system: Abdomen is nondistended, soft and nontender. No organomegaly or masses felt. Normal bowel sounds heard. Central nervous system: Alert and oriented. No focal neurological deficits. Extremities: Symmetric 5 x 5 power. Skin: No rashes, lesions or ulcers Psychiatry: Judgement and insight appear normal. Mood & affect appropriate.   Data Reviewed:  Lab results reviewed.  Family Communication:    Disposition: Status is: Inpatient Remains inpatient appropriate because: Severity of disease,  Planned Discharge Destination: Home    Time spent: 35 minutes  Author: Sharen Hones, MD 04/22/2022 2:21 PM  For on call review [email or web address].

## 2022-04-22 NOTE — Plan of Care (Signed)

## 2022-04-23 DIAGNOSIS — R55 Syncope and collapse: Secondary | ICD-10-CM | POA: Diagnosis not present

## 2022-04-23 DIAGNOSIS — U071 COVID-19: Secondary | ICD-10-CM | POA: Diagnosis not present

## 2022-04-23 LAB — COMPREHENSIVE METABOLIC PANEL
ALT: 26 U/L (ref 0–44)
AST: 29 U/L (ref 15–41)
Albumin: 3.8 g/dL (ref 3.5–5.0)
Alkaline Phosphatase: 50 U/L (ref 38–126)
Anion gap: 8 (ref 5–15)
BUN: 17 mg/dL (ref 8–23)
CO2: 25 mmol/L (ref 22–32)
Calcium: 10 mg/dL (ref 8.9–10.3)
Chloride: 106 mmol/L (ref 98–111)
Creatinine, Ser: 0.81 mg/dL (ref 0.44–1.00)
GFR, Estimated: >60 mL/min (ref >60)
Glucose, Bld: 93 mg/dL (ref 70–99)
Potassium: 4.2 mmol/L (ref 3.5–5.1)
Sodium: 139 mmol/L (ref 135–145)
Total Bilirubin: 0.7 mg/dL (ref 0.3–1.2)
Total Protein: 7.3 g/dL (ref 6.5–8.1)

## 2022-04-23 LAB — PHOSPHORUS: Phosphorus: 3.4 mg/dL (ref 2.5–4.6)

## 2022-04-23 LAB — CBC WITH DIFFERENTIAL/PLATELET
Abs Immature Granulocytes: 0.04 10*3/uL (ref 0.00–0.07)
Basophils Absolute: 0 10*3/uL (ref 0.0–0.1)
Basophils Relative: 0 %
Eosinophils Absolute: 0 10*3/uL (ref 0.0–0.5)
Eosinophils Relative: 0 %
HCT: 39.5 % (ref 36.0–46.0)
Hemoglobin: 12.9 g/dL (ref 12.0–15.0)
Immature Granulocytes: 0 %
Lymphocytes Relative: 34 %
Lymphs Abs: 3.3 10*3/uL (ref 0.7–4.0)
MCH: 31 pg (ref 26.0–34.0)
MCHC: 32.7 g/dL (ref 30.0–36.0)
MCV: 95 fL (ref 80.0–100.0)
Monocytes Absolute: 0.7 10*3/uL (ref 0.1–1.0)
Monocytes Relative: 7 %
Neutro Abs: 5.8 10*3/uL (ref 1.7–7.7)
Neutrophils Relative %: 59 %
Platelets: 280 10*3/uL (ref 150–400)
RBC: 4.16 MIL/uL (ref 3.87–5.11)
RDW: 13 % (ref 11.5–15.5)
WBC: 9.9 10*3/uL (ref 4.0–10.5)
nRBC: 0 % (ref 0.0–0.2)

## 2022-04-23 LAB — MAGNESIUM: Magnesium: 2.2 mg/dL (ref 1.7–2.4)

## 2022-04-23 MED ORDER — NIRMATRELVIR/RITONAVIR (PAXLOVID)TABLET: 3.0000 | ORAL_TABLET | Freq: Two times a day (BID) | ORAL | 0 refills | Status: AC

## 2022-04-23 MED ORDER — CIPROFLOXACIN-DEXAMETHASONE 0.3-0.1 % OT SUSP: 4.0000 [drp] | Freq: Two times a day (BID) | OTIC | 0 refills | Status: DC

## 2022-04-23 NOTE — Plan of Care (Signed)

## 2022-04-23 NOTE — Discharge Summary (Signed)
Physician Discharge Summary   Patient: Tiffany Mcbride MRN: [id number] DOB: [birth year]  Admit date:     04/20/2022  Discharge date: 04/23/22  Discharge Physician: Sharen Hones   PCP: Kirk Ruths, MD   Recommendations at discharge:   Follow-up with PCP in 1 week. 2.  Restart statin after completion of Paxlovid.  Discharge Diagnoses: Principal Problem:   COVID Active Problems:   Obesity (BMI 30-39.9)   COVID-19 virus infection   COVID-19   Near syncope Hypokalemia. Resolved Problems:   * No resolved hospital problems. *  Hospital Course: 81 y.o. female with medical history significant of recent right PCA stroke, HTN, GERD, obesity, presented with multiple complaints of headache, congestion, sore throat, cough, weakness and fall.  She was diagnosed of COVID-19 infection and started on Paxlovid.  CT head was negative for any acute infarct.  Assessment and Plan: Near syncope. COVID infection. Otitis externa Patient condition much improved after giving fluids.  No longer feels dizzy.  Upper respite symptoms better.  Ear pain is also improving after Cipro eardrop. Patient is medically stable to be discharged, she will be followed up with PCP in 1 week.   History of CVA. Continue aspirin   Obesity with BMI 36.021. Diet exercise.  Hypokalemia. Potassium normalized.       Consultants: None Procedures performed: None  Disposition: Home Diet recommendation:  Discharge Diet Orders (From admission, onward)     Start     Ordered   04/23/22 0000  Diet - low sodium heart healthy        04/23/22 1049           Cardiac diet DISCHARGE MEDICATION: Allergies as of 04/23/2022       Reactions   Codeine Hives   Meloxicam Other (See Comments)   Causes dizziness   Sulfa Antibiotics Hives        Medication List     STOP taking these medications    atorvastatin 20 MG tablet Commonly known as: LIPITOR       TAKE these medications    aspirin EC 81 MG  tablet Take 1 tablet (81 mg total) by mouth daily. Swallow whole.   Azelaic Acid 15 % gel Apply 1 application topically 2 (two) times daily.   Bio-Flax 1000 MG Caps Take 1,000 mg by mouth daily.   CALCIUM 600 + D PO Take 2 tablets by mouth daily.   ciprofloxacin-dexamethasone OTIC suspension Commonly known as: CIPRODEX Place 4 drops into the right ear 2 (two) times daily.   cyanocobalamin 500 MCG tablet Commonly known as: VITAMIN B12 Take 500 mcg by mouth daily.   fluocinonide 0.05 % external solution Commonly known as: LIDEX Apply 1 Application topically 2 (two) times daily.   ibandronate 150 MG tablet Commonly known as: BONIVA Take 150 mg by mouth every 30 (thirty) days.   latanoprost 0.005 % ophthalmic solution Commonly known as: XALATAN Place 1 drop into both eyes at bedtime.   mirabegron ER 25 MG Tb24 tablet Commonly known as: MYRBETRIQ Take 25 mg by mouth daily.   multivitamin with minerals tablet Take 1 tablet by mouth daily.   NexIUM 40 MG capsule Generic drug: esomeprazole TAKE ONE CAPSULE BY MOUTH ONCE A DAY BEFORE BREAKFAST   nirmatrelvir/ritonavir EUA 20 x 150 MG & 10 x 100MG  Tabs Commonly known as: PAXLOVID Take 3 tablets by mouth 2 (two) times daily for 5 days. Complete current dose   timolol 0.5 % ophthalmic solution Commonly known as: TIMOPTIC Place  1 drop into both eyes 2 (two) times daily.        Follow-up Information     Kirk Ruths, MD Follow up in 1 week(s).   Specialty: Internal Medicine Contact information: [place] [address] [phone]                Discharge Exam: Danley Danker Weights   04/20/22 0959  Weight: 89.8 kg   General exam: Appears calm and comfortable  Respiratory system: Clear to auscultation. Respiratory effort normal. Cardiovascular system: S1 & S2 heard, RRR. No JVD, murmurs, rubs, gallops or clicks. No pedal edema. Gastrointestinal system:  Abdomen is nondistended, soft and nontender. No organomegaly or masses felt. Normal bowel sounds heard. Central nervous system: Alert and oriented. No focal neurological deficits. Extremities: Symmetric 5 x 5 power. Skin: No rashes, lesions or ulcers Psychiatry: Judgement and insight appear normal. Mood & affect appropriate.    Condition at discharge: good  The results of significant diagnostics from this hospitalization (including imaging, microbiology, ancillary and laboratory) are listed below for reference.   Imaging Studies: DG Chest 2 View  Result Date: 04/20/2022 CLINICAL DATA:  Cough, recent fall EXAM: CHEST - 2 VIEW COMPARISON:  Previous studies including the examination of 02/27/2021 FINDINGS: Cardiac size is within normal limits. There are no signs of pulmonary edema or focal pulmonary consolidation. There is no pleural effusion or pneumothorax. There is evidence vertebroplasty in 2 of the lower thoracic vertebral bodies. IMPRESSION: There are no signs of pulmonary edema or focal pulmonary consolidation. Electronically Signed   By: Elmer Picker M.D.   On: 04/20/2022 11:13   CT HEAD WO CONTRAST  Result Date: 04/20/2022 CLINICAL DATA:  Head trauma, minor (Age >= 65y) EXAM: CT HEAD WITHOUT CONTRAST TECHNIQUE: Contiguous axial images were obtained from the base of the skull through the vertex without intravenous contrast. RADIATION DOSE REDUCTION: This exam was performed according to the departmental dose-optimization program which includes automated exposure control, adjustment of the mA and/or kV according to patient size and/or use of iterative reconstruction technique. COMPARISON:  Head CT 04/24/2021. FINDINGS: Brain: No evidence of acute intracranial hemorrhage or extra-axial collection. Old infarct in the right occipital lobe with associated encephalomalacia. No evidence of new acute infarct.No concerning mass effect.The ventricles are normal in size. Vascular: Vascular  calcifications.  No hyperdense vessel. Skull: Hyperostosis frontalis internus. No evidence of calvarial fracture. Sinuses/Orbits: Minimal ethmoid air cell mucosal thickening. Mastoid air cells are clear. Orbits are unremarkable. Other: None. IMPRESSION: No acute intracranial abnormality. Old right occipital lobe infarct. Electronically Signed   By: Maurine Simmering M.D.   On: 04/20/2022 11:02    Microbiology: Results for orders placed or performed during the hospital encounter of 04/20/22  SARS Coronavirus 2 by RT PCR (hospital order, performed in [facility] hospital lab) *cepheid single result test* Anterior Nasal Swab     Status: Abnormal   Collection Time: 04/20/22 10:11 AM   Specimen: Anterior Nasal Swab  Result Value Ref Range Status   SARS Coronavirus 2 by RT PCR POSITIVE (A) NEGATIVE Final    Comment: (NOTE) SARS-CoV-2 target nucleic acids are DETECTED  SARS-CoV-2 RNA is generally detectable in upper respiratory specimens  during the acute phase of infection.  Positive results are indicative  of the presence of the identified virus, but do not rule out bacterial infection or co-infection with other pathogens not detected by the test.  Clinical correlation with patient history and  other  diagnostic information is necessary to determine patient infection status.  The expected result is negative.  Fact Sheet for Patients:   https://www.patel.info/   Fact Sheet for Healthcare Providers:   https://hall.com/    This test is not yet approved or cleared by the [place] FDA and  has been authorized for detection and/or diagnosis of SARS-CoV-2 by FDA under an Emergency Use Authorization (EUA).  This EUA will remain in effect (meaning this test can be used) for the duration of  the COVID-19 declaration under Section 564(b)(1)  of the Act, 21 U.S.C. section 360-bbb-3(b)(1), unless the authorization is terminated or revoked sooner.   Performed  at [facility], [address] [place], [place] [address]     Labs: CBC: Recent Labs  Lab 04/20/22 1011 04/21/22 0530 04/22/22 0444 04/23/22 0511  WBC 12.9* 8.6 9.1 9.9  NEUTROABS  --  5.3 5.7 5.8  HGB 14.1 12.5 13.1 12.9  HCT 43.3 38.4 40.1 39.5  MCV 97.1 95.8 98.3 95.0  PLT 275 216 256 [id number]   Basic Metabolic Panel: Recent Labs  Lab 04/20/22 1011 04/21/22 0530 04/22/22 0444 04/23/22 0511  NA 142 143 142 139  K 3.5 3.1* 4.6 4.2  CL 108 110 113* 106  CO2 25 25 26 25   GLUCOSE 128* 104* 99 93  BUN 11 13 16 17   CREATININE 0.88 0.74 0.71 0.81  CALCIUM 9.5 8.6* 9.7 10.0  MG  --  1.9 2.1 2.2  PHOS  --  2.5 2.9 3.4   Liver Function Tests: Recent Labs  Lab 04/21/22 0530 04/22/22 0444 04/23/22 0511  AST 26 29 29   ALT 23 24 26   ALKPHOS 58 55 50  BILITOT 0.9 0.4 0.7  PROT 6.8 7.3 7.3  ALBUMIN 3.9 3.8 3.8   CBG: No results for input(s): "GLUCAP" in the last 168 hours.  Discharge time spent: greater than 30 minutes.  Signed: Sharen Hones, MD [facility] 04/23/2022

## 2022-07-03 ENCOUNTER — Ambulatory Visit (INDEPENDENT_AMBULATORY_CARE_PROVIDER_SITE_OTHER): Payer: Medicare Other | Admitting: Podiatry

## 2022-07-03 DIAGNOSIS — B351 Tinea unguium: Secondary | ICD-10-CM | POA: Diagnosis not present

## 2022-07-03 DIAGNOSIS — M79675 Pain in left toe(s): Secondary | ICD-10-CM

## 2022-07-03 DIAGNOSIS — M79674 Pain in right toe(s): Secondary | ICD-10-CM

## 2022-07-03 NOTE — Progress Notes (Signed)
   Chief Complaint  Patient presents with   foot care     Patient is here for routine foot care.    SUBJECTIVE Patient presents to office today complaining of elongated, thickened nails that cause pain while ambulating in shoes.  Patient is unable to trim their own nails. Patient is here for further evaluation and treatment.  Past Medical History:  Diagnosis Date   Arthritis    At high risk for falls    GERD (gastroesophageal reflux disease)    Glaucoma of both eyes    Heart murmur    wen she was in her 30's   History of gastric ulcer    History of kidney stones    Hyperlipidemia    Left ureteral calculus    Stroke [facility])    Urgency of urination     OBJECTIVE General Patient is awake, alert, and oriented x 3 and in no acute distress. Derm Skin is dry and supple bilateral. Negative open lesions or macerations. Remaining integument unremarkable. Nails are tender, long, thickened and dystrophic with subungual debris, consistent with onychomycosis, 1-5 bilateral. No signs of infection noted. Vasc  DP and PT pedal pulses palpable bilaterally. Temperature gradient within normal limits.  Neuro Epicritic and protective threshold sensation grossly intact bilaterally.  Musculoskeletal Exam No symptomatic pedal deformities noted bilateral. Muscular strength within normal limits.  ASSESSMENT 1.  Pain due to onychomycosis of toenails both  PLAN OF CARE 1. Patient evaluated today.  2. Instructed to maintain good pedal hygiene and foot care.  3. Mechanical debridement of nails 1-5 bilaterally performed using a nail nipper. Filed with dremel without incident.  4. Return to clinic in 3 mos.    Felecia Shelling, DPM [facility]  Dr. Felecia Shelling, DPM    [address] [place], [place] [address]                Office [phone]  Fax [phone]

## 2022-08-14 ENCOUNTER — Encounter (HOSPITAL_COMMUNITY): Payer: Self-pay

## 2022-08-14 ENCOUNTER — Observation Stay (HOSPITAL_COMMUNITY)
Admission: EM | Admit: 2022-08-14 | Discharge: 2022-08-15 | Disposition: A | Discharge status: Home / Self Care | Payer: 59 | Attending: Internal Medicine | Admitting: Internal Medicine

## 2022-08-14 ENCOUNTER — Other Ambulatory Visit: Payer: Self-pay

## 2022-08-14 ENCOUNTER — Emergency Department (HOSPITAL_COMMUNITY): Payer: 59

## 2022-08-14 DIAGNOSIS — H401130 Primary open-angle glaucoma, bilateral, stage unspecified: Secondary | ICD-10-CM | POA: Diagnosis not present

## 2022-08-14 DIAGNOSIS — Z6836 Body mass index (BMI) 36.0-36.9, adult: Secondary | ICD-10-CM | POA: Insufficient documentation

## 2022-08-14 DIAGNOSIS — Z8673 Personal history of transient ischemic attack (TIA), and cerebral infarction without residual deficits: Secondary | ICD-10-CM | POA: Diagnosis not present

## 2022-08-14 DIAGNOSIS — Z66 Do not resuscitate: Secondary | ICD-10-CM | POA: Diagnosis not present

## 2022-08-14 DIAGNOSIS — Z7982 Long term (current) use of aspirin: Secondary | ICD-10-CM | POA: Diagnosis not present

## 2022-08-14 DIAGNOSIS — R32 Unspecified urinary incontinence: Secondary | ICD-10-CM | POA: Insufficient documentation

## 2022-08-14 DIAGNOSIS — R42 Dizziness and giddiness: Principal | ICD-10-CM | POA: Insufficient documentation

## 2022-08-14 DIAGNOSIS — E669 Obesity, unspecified: Secondary | ICD-10-CM | POA: Insufficient documentation

## 2022-08-14 DIAGNOSIS — H409 Unspecified glaucoma: Secondary | ICD-10-CM | POA: Diagnosis present

## 2022-08-14 LAB — CBC WITH DIFFERENTIAL/PLATELET
Abs Immature Granulocytes: 0.03 10*3/uL (ref 0.00–0.07)
Basophils Absolute: 0 10*3/uL (ref 0.0–0.1)
Basophils Relative: 0 %
Eosinophils Absolute: 0.3 10*3/uL (ref 0.0–0.5)
Eosinophils Relative: 3 %
HCT: 42.1 % (ref 36.0–46.0)
Hemoglobin: 14.4 g/dL (ref 12.0–15.0)
Immature Granulocytes: 0 %
Lymphocytes Relative: 35 %
Lymphs Abs: 3.2 10*3/uL (ref 0.7–4.0)
MCH: 32.7 pg (ref 26.0–34.0)
MCHC: 34.2 g/dL (ref 30.0–36.0)
MCV: 95.7 fL (ref 80.0–100.0)
Monocytes Absolute: 0.5 10*3/uL (ref 0.1–1.0)
Monocytes Relative: 5 %
Neutro Abs: 5.2 10*3/uL (ref 1.7–7.7)
Neutrophils Relative %: 57 %
Platelets: 245 10*3/uL (ref 150–400)
RBC: 4.4 MIL/uL (ref 3.87–5.11)
RDW: 12.5 % (ref 11.5–15.5)
WBC: 9.3 10*3/uL (ref 4.0–10.5)
nRBC: 0 % (ref 0.0–0.2)

## 2022-08-14 LAB — URINALYSIS, ROUTINE W REFLEX MICROSCOPIC
Bilirubin Urine: NEGATIVE
Glucose, UA: NEGATIVE mg/dL
Hgb urine dipstick: NEGATIVE
Ketones, ur: NEGATIVE mg/dL
Nitrite: NEGATIVE
Protein, ur: NEGATIVE mg/dL
Specific Gravity, Urine: 1.008 (ref 1.005–1.030)
pH: 5 (ref 5.0–8.0)

## 2022-08-14 LAB — BASIC METABOLIC PANEL
Anion gap: 10 (ref 5–15)
BUN: 14 mg/dL (ref 8–23)
CO2: 24 mmol/L (ref 22–32)
Calcium: 9.4 mg/dL (ref 8.9–10.3)
Chloride: 105 mmol/L (ref 98–111)
Creatinine, Ser: 0.9 mg/dL (ref 0.44–1.00)
GFR, Estimated: >60 mL/min (ref >60)
Glucose, Bld: 95 mg/dL (ref 70–99)
Potassium: 4.2 mmol/L (ref 3.5–5.1)
Sodium: 139 mmol/L (ref 135–145)

## 2022-08-14 MED ORDER — PANTOPRAZOLE SODIUM 40 MG PO TBEC
40.0000 mg | DELAYED_RELEASE_TABLET | Freq: Every day | ORAL | Status: DC
  Administered 2022-08-15: 40 mg via ORAL
  Filled 2022-08-14: qty 1

## 2022-08-14 MED ORDER — BISACODYL 5 MG PO TBEC: 5.0000 mg | DELAYED_RELEASE_TABLET | Freq: Every day | ORAL | Status: DC | PRN

## 2022-08-14 MED ORDER — ACETAMINOPHEN 325 MG PO TABS: 650.0000 mg | ORAL_TABLET | Freq: Four times a day (QID) | ORAL | Status: DC | PRN

## 2022-08-14 MED ORDER — ONDANSETRON HCL 4 MG PO TABS: 4.0000 mg | ORAL_TABLET | Freq: Four times a day (QID) | ORAL | Status: DC | PRN

## 2022-08-14 MED ORDER — OXYCODONE HCL 5 MG PO TABS: 5.0000 mg | ORAL_TABLET | ORAL | Status: DC | PRN

## 2022-08-14 MED ORDER — DOCUSATE SODIUM 100 MG PO CAPS
100.0000 mg | ORAL_CAPSULE | Freq: Two times a day (BID) | ORAL | Status: DC
  Administered 2022-08-14 – 2022-08-15 (×2): 100 mg via ORAL
  Filled 2022-08-14 (×2): qty 1

## 2022-08-14 MED ORDER — ASPIRIN 81 MG PO TBEC
81.0000 mg | DELAYED_RELEASE_TABLET | Freq: Every day | ORAL | Status: DC
  Administered 2022-08-15: 81 mg via ORAL
  Filled 2022-08-14: qty 1

## 2022-08-14 MED ORDER — MECLIZINE HCL 25 MG PO TABS
25.0000 mg | ORAL_TABLET | Freq: Three times a day (TID) | ORAL | Status: DC | PRN
  Administered 2022-08-15: 25 mg via ORAL
  Filled 2022-08-14 (×2): qty 1

## 2022-08-14 MED ORDER — POLYETHYLENE GLYCOL 3350 17 G PO PACK: 17.0000 g | PACK | Freq: Every day | ORAL | Status: DC | PRN

## 2022-08-14 MED ORDER — SODIUM CHLORIDE 0.9% FLUSH
3.0000 mL | Freq: Two times a day (BID) | INTRAVENOUS | Status: DC
  Administered 2022-08-14: 3 mL via INTRAVENOUS

## 2022-08-14 MED ORDER — ACETAMINOPHEN 650 MG RE SUPP: 650.0000 mg | Freq: Four times a day (QID) | RECTAL | Status: DC | PRN

## 2022-08-14 MED ORDER — MECLIZINE HCL 25 MG PO TABS
25.0000 mg | ORAL_TABLET | Freq: Once | ORAL | Status: AC
  Administered 2022-08-14: 25 mg via ORAL
  Filled 2022-08-14: qty 1

## 2022-08-14 MED ORDER — ORAL CARE MOUTH RINSE: 15.0000 mL | OROMUCOSAL | Status: DC | PRN

## 2022-08-14 MED ORDER — MIRABEGRON ER 25 MG PO TB24
25.0000 mg | ORAL_TABLET | Freq: Every day | ORAL | Status: DC
  Administered 2022-08-15: 25 mg via ORAL
  Filled 2022-08-14: qty 1

## 2022-08-14 MED ORDER — ONDANSETRON HCL 4 MG/2ML IJ SOLN
4.0000 mg | Freq: Once | INTRAMUSCULAR | Status: AC
  Administered 2022-08-14: 4 mg via INTRAVENOUS
  Filled 2022-08-14: qty 2

## 2022-08-14 MED ORDER — HYDRALAZINE HCL 20 MG/ML IJ SOLN: 5.0000 mg | INTRAMUSCULAR | Status: DC | PRN

## 2022-08-14 MED ORDER — TIMOLOL MALEATE 0.5 % OP SOLN
1.0000 [drp] | Freq: Two times a day (BID) | OPHTHALMIC | Status: DC
  Administered 2022-08-15: 1 [drp] via OPHTHALMIC
  Filled 2022-08-14: qty 5

## 2022-08-14 MED ORDER — DIAZEPAM 5 MG PO TABS
5.0000 mg | ORAL_TABLET | Freq: Four times a day (QID) | ORAL | Status: DC | PRN
  Administered 2022-08-14: 5 mg via ORAL
  Filled 2022-08-14: qty 1

## 2022-08-14 MED ORDER — ONDANSETRON HCL 4 MG/2ML IJ SOLN: 4.0000 mg | Freq: Four times a day (QID) | INTRAMUSCULAR | Status: DC | PRN

## 2022-08-14 MED ORDER — LATANOPROST 0.005 % OP SOLN
1.0000 [drp] | Freq: Every day | OPHTHALMIC | Status: DC
  Administered 2022-08-14: 1 [drp] via OPHTHALMIC
  Filled 2022-08-14: qty 2.5

## 2022-08-14 MED ORDER — ENOXAPARIN SODIUM 40 MG/0.4ML IJ SOSY
40.0000 mg | PREFILLED_SYRINGE | INTRAMUSCULAR | Status: DC
  Administered 2022-08-14: 40 mg via SUBCUTANEOUS
  Filled 2022-08-14: qty 0.4

## 2022-08-14 NOTE — ED Notes (Signed)
Peripheral IV removed, patient reporting pain with flushing. Dr. Lorin Mercy notified and stated that patient does not need a new IV at this time.

## 2022-08-14 NOTE — ED Provider Notes (Signed)
[facility] EMERGENCY DEPARTMENT Provider Note   CSN: [id number] Arrival date & time: 08/14/22  1149     History  Chief Complaint  Patient presents with   Dizziness    [name] is a 82 y.o. female.  82 year old female with prior medical history as detailed below presents for evaluation.  Patient reports intermittent episodes of dizziness and vertigo x 5 days.  Patient reports that she feels like the room is spinning when her symptoms are the worst.  Patient reports that positioning of her head will make her vertigo worse.  She denies speech change.  She denies vision change.  She denies focal weakness.  She reports history of TIA.  She denies prior history of positional vertigo.  Patient reports that she uses a walker at baseline.  With current symptoms she is having significant difficulty ambulating.  The history is provided by the patient and medical records.       Home Medications Prior to Admission medications   Medication Sig Start Date End Date Taking? Authorizing Provider  aspirin EC 81 MG EC tablet Take 1 tablet (81 mg total) by mouth daily. Swallow whole. 03/02/21 04/20/22  Lorin Glass, MD  Azelaic Acid 15 % gel Apply 1 application topically 2 (two) times daily.     [provider]  Calcium Carb-Cholecalciferol (CALCIUM 600 + D PO) Take 2 tablets by mouth daily.    [provider]  ciprofloxacin-dexamethasone (CIPRODEX) OTIC suspension Place 4 drops into the right ear 2 (two) times daily. 04/23/22   Marrion Coy, MD  Flaxseed, Linseed, (BIO-FLAX) 1000 MG CAPS Take 1,000 mg by mouth daily.    [provider]  fluocinonide (LIDEX) 0.05 % external solution Apply 1 Application topically 2 (two) times daily. 02/20/22   [provider]  ibandronate (BONIVA) 150 MG tablet Take 150 mg by mouth every 30 (thirty) days. 03/30/22   [provider]  latanoprost (XALATAN) 0.005 % ophthalmic solution Place 1 drop  into both eyes at bedtime. 07/18/16   [provider]  mirabegron ER (MYRBETRIQ) 25 MG TB24 tablet Take 25 mg by mouth daily.  12/14/17   [provider]  Multiple Vitamins-Minerals (MULTIVITAMIN WITH MINERALS) tablet Take 1 tablet by mouth daily.    [provider]  NEXIUM 40 MG capsule TAKE ONE CAPSULE BY MOUTH ONCE A DAY BEFORE BREAKFAST 05/19/13   Sherlene Shams, MD  timolol (TIMOPTIC) 0.5 % ophthalmic solution Place 1 drop into both eyes 2 (two) times daily. 06/30/16   [provider]  vitamin B-12 (CYANOCOBALAMIN) 500 MCG tablet Take 500 mcg by mouth daily.    [provider]      Allergies    Codeine, Meloxicam, and Sulfa antibiotics    Review of Systems   Review of Systems  All other systems reviewed and are negative.   Physical Exam Updated Vital Signs BP (!) 145/57 (BP Location: Left Arm)   Pulse 62   Temp 98 F (36.7 C) (Oral)   Resp 20   SpO2 98%  Physical Exam Vitals and nursing note reviewed.  Constitutional:      General: She is not in acute distress.    Appearance: Normal appearance. She is well-developed.  HENT:     Head: Normocephalic and atraumatic.  Eyes:     Conjunctiva/sclera: Conjunctivae normal.     Pupils: Pupils are equal, round, and reactive to light.  Cardiovascular:     Rate and Rhythm: Normal rate and regular  rhythm.     Heart sounds: Normal heart sounds.  Pulmonary:     Effort: Pulmonary effort is normal. No respiratory distress.     Breath sounds: Normal breath sounds.  Abdominal:     General: There is no distension.     Palpations: Abdomen is soft.     Tenderness: There is no abdominal tenderness.  Musculoskeletal:        General: No deformity. Normal range of motion.     Cervical back: Normal range of motion and neck supple.  Skin:    General: Skin is warm and dry.  Neurological:     General: No focal deficit present.     Mental Status: She is alert and oriented to person, place, and  time.     Comments: Alert, normal speech, no facial droop, 5 out of 5 strength in both upper and lower extremities  Mild bilateral horizontal nystagmus  VAN negative  Appears extremely unsteady with attempted ambulation.       ED Results / Procedures / Treatments   Labs (all labs ordered are listed, but only abnormal results are displayed) Labs Reviewed  CBC WITH DIFFERENTIAL/PLATELET  URINALYSIS, ROUTINE W REFLEX MICROSCOPIC  BASIC METABOLIC PANEL    EKG EKG Interpretation  Date/Time:  Friday August 14 2022 12:30:17 EST Ventricular Rate:  61 PR Interval:  178 QRS Duration: 84 QT Interval:  409 QTC Calculation: 412 R Axis:   63 Text Interpretation: Sinus rhythm Low voltage, precordial leads Confirmed by Dene Gentry [id number]) on 08/14/2022 12:34:40 PM  Radiology No results found.  Procedures Procedures    Medications Ordered in ED Medications  ondansetron (ZOFRAN) injection 4 mg (4 mg Intravenous Given 08/14/22 1220)  meclizine (ANTIVERT) tablet 25 mg (25 mg Oral Given 08/14/22 1220)    ED Course/ Medical Decision Making/ A&P Clinical Course as of 08/14/22 1521  Fri Aug 14, 2022  1512 Stable 81 YOF with vertiginous symptoms x 1 week. MRI ok. Cannot safely ambulate. Trialed ambulation.  [CC]    Clinical Course User Index [CC] Tretha Sciara, MD                           Medical Decision Making Amount and/or Complexity of Data Reviewed Labs: ordered. Radiology: ordered.  Risk Prescription drug management. Decision regarding hospitalization.    Medical Screen Complete  This patient presented to the ED with complaint of dizzy, weak, vertigo.  This complaint involves an extensive number of treatment options. The initial differential diagnosis includes, but is not limited to, benign positional vertigo, intracranial pathology, metabolic abnormality, etc.  This presentation is: Acute, Chronic, Self-Limited, Previously Undiagnosed, Uncertain Prognosis,  Complicated, Systemic Symptoms, and Threat to Life/Bodily Function  Patient is presenting with dizziness and vertiginous symptoms x 4 to 5 days.  Patient ambulates with walker at baseline.  Patient reports that with her current symptoms she is having significantly ambulating.  Exam is concerning for vertigo.  MRI brain is negative for acute infarct.  Remote PCA infarct seen on MRI.  CBC and BMP obtained are without significant abnormality.  Patient continues to complain of significant vertiginous symptoms despite Zofran and Antivert.  Patient would likely benefit from admission given her significant fall risk.  Dr. Lorin Mercy aware of case and will evaluate.   Additional history obtained: External records from outside sources obtained and reviewed including prior ED visits and prior Inpatient records.    Lab Tests:  I ordered and personally interpreted labs.  The pertinent results include: CBC, BMP, UA   Imaging Studies ordered:  I ordered imaging studies including MRI brain I independently visualized and interpreted obtained imaging which showed no acute pathology I agree with the radiologist interpretation.   Cardiac Monitoring:  The patient was maintained on a cardiac monitor.  I personally viewed and interpreted the cardiac monitor which showed an underlying rhythm of: NSR   Medicines ordered:  I ordered medication including meclizine, Zofran for vertigo, nausea Reevaluation of the patient after these medicines showed that the patient: improved    Problem List / ED Course:  Vertigo   Reevaluation:  After the interventions noted above, I reevaluated the patient and found that they have: stayed the same  Disposition:  After consideration of the diagnostic results and the patients response to treatment, I feel that the patent would benefit from admission.          Final Clinical Impression(s) / ED Diagnoses Final diagnoses:  Vertigo    Rx / DC  Orders ED Discharge Orders     None         Valarie Merino, MD 08/14/22 [phone]

## 2022-08-14 NOTE — ED Notes (Signed)
ED TO INPATIENT HANDOFF REPORT  ED Nurse Name and Phone #: Antionette Poles RN #[phone]  S Name/Age/Gender Tiffany Mcbride 82 y.o. female Room/Bed: 032C/032C  Code Status   Code Status: DNR  Home/SNF/Other Home Patient oriented to: self, place, time, and situation Is this baseline? Yes   Triage Complete: Triage complete  Chief Complaint Vertigo [R42]  Triage Note Patient arrives vie EMS from home. Dizziness for 5 days. Patient reports having a syncopal episode on Tuesday while out with family. Denies any falls on injury. Hx of TIA 01/2021. Currently complaining of headache and blurred vision. States that it is worse when standing or moving around.   Allergies Allergies  Allergen Reactions   Codeine Hives   Meloxicam Other (See Comments)    Causes dizziness   Sulfa Antibiotics Hives    Level of Care/Admitting Diagnosis ED Disposition     ED Disposition  Admit   Condition  --   Comment  Hospital Area: [facility] [[id number]]  Level of Care: Telemetry Medical [104]  May place patient in observation at [facility] or [place] if equivalent level of care is available:: No  Covid Evaluation: Asymptomatic - no recent exposure (last 10 days) testing not required  Diagnosis: Vertigo [207257]  Admitting Physician: Karmen Bongo [[id number]]  Attending Physician: Karmen Bongo [[id number]]          B Medical/Surgery History Past Medical History:  Diagnosis Date   Arthritis    At high risk for falls    GERD (gastroesophageal reflux disease)    Glaucoma of both eyes    Heart murmur    wen she was in her 84's   History of gastric ulcer    History of kidney stones    Hyperlipidemia    Left ureteral calculus    Stroke [facility])    TIA (transient ischemic attack) 01/2021   Urgency of urination    Past Surgical History:  Procedure Laterality Date   BACK SURGERY  07/2016   kyphoplasty   CERVICAL SPINE SURGERY  1992   posterior diskectomy   COLONOSCOPY      CYSTOSCOPY WITH RETROGRADE PYELOGRAM, URETEROSCOPY AND STENT PLACEMENT Left 03/09/2013   Procedure: CYSTOSCOPY WITH RETROGRADE PYELOGRAM, URETEROSCOPY AND LEFT STENT PLACEMENT;  Surgeon: Alexis Frock, MD;  Location: [facility];  Service: Urology;  Laterality: Left;   ENDOMETRIAL ABLATION     EYE SURGERY Bilateral    cataract extractions   IR KYPHO THORACIC WITH BONE BIOPSY  09/03/2021   IR RADIOLOGIST EVAL & MGMT  08/26/2021   IR RADIOLOGIST EVAL & MGMT  09/18/2021   KYPHOPLASTY N/A 07/23/2016   Procedure: KYPHOPLASTY T11;  Surgeon: Hessie Knows, MD;  Location: [facility] ORS;  Service: Orthopedics;  Laterality: N/A;   KYPHOPLASTY N/A 03/21/2019   Procedure: L3 KYPHOPLASTY;  Surgeon: Hessie Knows, MD;  Location: [facility];  Service: Orthopedics;  Laterality: N/A;   LAPAROSCOPIC CHOLECYSTECTOMY  1998   PUBOVAGINAL SLING  1995   TUBAL LIGATION     UPPER GI ENDOSCOPY       A IV Location/Drains/Wounds Patient Lines/Drains/Airways Status     Active Line/Drains/Airways     None            Intake/Output Last 24 hours No intake or output data in the 24 hours ending 08/14/22 2053  Labs/Imaging Results for orders placed or performed during the hospital encounter of 08/14/22 (from the past 48 hour(s))  CBC with Differential     Status: None  Collection Time: 08/14/22 12:18 PM  Result Value Ref Range   WBC 9.3 4.0 - 10.5 K/uL   RBC 4.40 3.87 - 5.11 MIL/uL   Hemoglobin 14.4 12.0 - 15.0 g/dL   HCT 33.8 25.0 - 53.9 %   MCV 95.7 80.0 - 100.0 fL   MCH 32.7 26.0 - 34.0 pg   MCHC 34.2 30.0 - 36.0 g/dL   RDW 76.7 34.1 - 93.7 %   Platelets 245 150 - 400 K/uL   nRBC 0.0 0.0 - 0.2 %   Neutrophils Relative % 57 %   Neutro Abs 5.2 1.7 - 7.7 K/uL   Lymphocytes Relative 35 %   Lymphs Abs 3.2 0.7 - 4.0 K/uL   Monocytes Relative 5 %   Monocytes Absolute 0.5 0.1 - 1.0 K/uL   Eosinophils Relative 3 %   Eosinophils Absolute 0.3 0.0 - 0.5 K/uL   Basophils Relative 0 %   Basophils  Absolute 0.0 0.0 - 0.1 K/uL   Immature Granulocytes 0 %   Abs Immature Granulocytes 0.03 0.00 - 0.07 K/uL    Comment: Performed at [facility], [address], [place], [place] [address]  Basic metabolic panel     Status: None   Collection Time: 08/14/22 12:18 PM  Result Value Ref Range   Sodium 139 135 - 145 mmol/L   Potassium 4.2 3.5 - 5.1 mmol/L   Chloride 105 98 - 111 mmol/L   CO2 24 22 - 32 mmol/L   Glucose, Bld 95 70 - 99 mg/dL    Comment: Glucose reference range applies only to samples taken after fasting for at least 8 hours.   BUN 14 8 - 23 mg/dL   Creatinine, Ser 9.73 0.44 - 1.00 mg/dL   Calcium 9.4 8.9 - 53.2 mg/dL   GFR, Estimated >99 >24 mL/min    Comment: (NOTE) Calculated using the CKD-EPI Creatinine Equation (2021)    Anion gap 10 5 - 15    Comment: Performed at [facility], [address], [place], [place] [address]  Urinalysis, Routine w reflex microscopic Urine, Clean Catch     Status: Abnormal   Collection Time: 08/14/22  1:31 PM  Result Value Ref Range   Color, Urine YELLOW YELLOW   APPearance CLEAR CLEAR   Specific Gravity, Urine 1.008 1.005 - 1.030   pH 5.0 5.0 - 8.0   Glucose, UA NEGATIVE NEGATIVE mg/dL   Hgb urine dipstick NEGATIVE NEGATIVE   Bilirubin Urine NEGATIVE NEGATIVE   Ketones, ur NEGATIVE NEGATIVE mg/dL   Protein, ur NEGATIVE NEGATIVE mg/dL   Nitrite NEGATIVE NEGATIVE   Leukocytes,Ua MODERATE (A) NEGATIVE   RBC / HPF 0-5 0 - 5 RBC/hpf   WBC, UA 11-20 0 - 5 WBC/hpf   Bacteria, UA RARE (A) NONE SEEN   Squamous Epithelial / HPF 0-5 0 - 5 /HPF   Mucus PRESENT     Comment: Performed at [facility], [address] [place], [place] [address]   MR BRAIN WO CONTRAST  Result Date: 08/14/2022 CLINICAL DATA:  Syncope/presyncope, cerebrovascular cause suspected; acute onset vertigo. EXAM: MRI HEAD WITHOUT CONTRAST TECHNIQUE: Multiplanar, multiecho pulse sequences of the brain and surrounding structures were obtained without  intravenous contrast. COMPARISON:  Head CT April 21, 2019. MRI of the brain February 27, 2021. FINDINGS: Brain: No acute infarction, hemorrhage, hydrocephalus, extra-axial collection or mass lesion. Area of encephalomalacia and gliosis in the right occipital lobe consistent with known remote right PCA territory infarct. Scattered foci  of T2 hyperintensity are seen within the white matter of the cerebral hemispheres, nonspecific, most likely related to chronic small vessel ischemia. This has not significantly changed compared to prior MRI. Vascular: Normal flow voids. Skull and upper cervical spine: Normal marrow signal. Sinuses/Orbits: Negative. Other: None. IMPRESSION: 1. No acute intracranial abnormality. 2. Remote right PCA territory infarct. 3. Mild chronic microvascular ischemic changes of the white matter, stable. Electronically Signed   By: Baldemar Lenis M.D.   On: 08/14/2022 13:32    Pending Labs Unresulted Labs (From admission, onward)     Start     Ordered   08/15/22 0500  Basic metabolic panel  Tomorrow morning,   R        08/14/22 1649   08/15/22 0500  CBC  Tomorrow morning,   R        08/14/22 1649   08/14/22 1408  Urine Culture  Once,   URGENT       Question:  Indication  Answer:  Dysuria   08/14/22 1407            Vitals/Pain Today's Vitals   08/14/22 1615 08/14/22 1806 08/14/22 1807 08/14/22 1935  BP: (!) 105/47 (!) 138/120  120/68  Pulse: (!) 59 61  61  Resp:   18 (!) 23  Temp:      TempSrc:      SpO2: 97% 97%  95%  PainSc:        Isolation Precautions No active isolations  Medications Medications  aspirin EC tablet 81 mg (has no administration in time range)  pantoprazole (PROTONIX) EC tablet 40 mg (has no administration in time range)  mirabegron ER (MYRBETRIQ) tablet 25 mg (has no administration in time range)  latanoprost (XALATAN) 0.005 % ophthalmic solution 1 drop (has no administration in time range)  timolol (TIMOPTIC) 0.5 % ophthalmic  solution 1 drop (has no administration in time range)  enoxaparin (LOVENOX) injection 40 mg (40 mg Subcutaneous Given 08/14/22 1825)  sodium chloride flush (NS) 0.9 % injection 3 mL (has no administration in time range)  acetaminophen (TYLENOL) tablet 650 mg (has no administration in time range)    Or  acetaminophen (TYLENOL) suppository 650 mg (has no administration in time range)  oxyCODONE (Oxy IR/ROXICODONE) immediate release tablet 5 mg (has no administration in time range)  docusate sodium (COLACE) capsule 100 mg (has no administration in time range)  polyethylene glycol (MIRALAX / GLYCOLAX) packet 17 g (has no administration in time range)  bisacodyl (DULCOLAX) EC tablet 5 mg (has no administration in time range)  ondansetron (ZOFRAN) tablet 4 mg (has no administration in time range)    Or  ondansetron (ZOFRAN) injection 4 mg (has no administration in time range)  hydrALAZINE (APRESOLINE) injection 5 mg (has no administration in time range)  diazepam (VALIUM) tablet 5 mg (has no administration in time range)  meclizine (ANTIVERT) tablet 25 mg (has no administration in time range)  ondansetron (ZOFRAN) injection 4 mg (4 mg Intravenous Given 08/14/22 1220)  meclizine (ANTIVERT) tablet 25 mg (25 mg Oral Given 08/14/22 1220)    Mobility walks Low fall risk   Focused Assessments Neuro Assessment Handoff:  Swallow screen pass? Yes          Neuro Assessment: Exceptions to WDL (dizziness) Neuro Checks:      Has TPA been given? No If patient is a Neuro Trauma and patient is going to OR before floor call report to 4N Charge nurse: [phone] or [phone]  R Recommendations: See Admitting Provider Note  Report given to:   Additional Notes: PT is A&Ox 3 at this time.

## 2022-08-14 NOTE — ED Notes (Signed)
Patient transported to MRI 

## 2022-08-14 NOTE — ED Triage Notes (Signed)
Patient arrives vie EMS from home. Dizziness for 5 days. Patient reports having a syncopal episode on Tuesday while out with family. Denies any falls on injury. Hx of TIA 01/2021. Currently complaining of headache and blurred vision. States that it is worse when standing or moving around.

## 2022-08-14 NOTE — H&P (Addendum)
History and Physical    Patient: Tiffany Mcbride BHA:[id number] DOB: 1940/10/14 DOA: 08/14/2022 DOS: the patient was seen and examined on 08/14/2022 PCP: Kirk Ruths, MD  Patient coming from: Home - lives with granddaughter and her children; NOK: Cam Hai, [phone]   Chief Complaint: Dizziness  HPI: Tiffany Mcbride is a 82 y.o. female with medical history significant of glaucoma, HLD, and prior CVA presenting with dizziness. She reports that she became acutely dizzy sometime last weekend.  She is ok at rest but feels like the world is spinning when she tries to move, including when she is lying in bed at night.  No associated n/v.  Mild URI symptoms but she has chronic bronchitis (no h/o smoking) and doesn't feel significantly worse than usual.  No other indication of illness.      ER Course:  Vertigo, not improved with current interventions.  Dizziness for a couple of days.  Given Meclizine, Zofran.  MRI without acute findings.  Unstable at baseline, needs PT and dizziness improvement.     Review of Systems: As mentioned in the history of present illness. All other systems reviewed and are negative. Past Medical History:  Diagnosis Date   Arthritis    At high risk for falls    GERD (gastroesophageal reflux disease)    Glaucoma of both eyes    Heart murmur    wen she was in her 32's   History of gastric ulcer    History of kidney stones    Hyperlipidemia    Left ureteral calculus    Stroke [facility])    TIA (transient ischemic attack) 01/2021   Urgency of urination    Past Surgical History:  Procedure Laterality Date   BACK SURGERY  07/2016   kyphoplasty   CERVICAL SPINE SURGERY  1992   posterior diskectomy   COLONOSCOPY     CYSTOSCOPY WITH RETROGRADE PYELOGRAM, URETEROSCOPY AND STENT PLACEMENT Left 03/09/2013   Procedure: CYSTOSCOPY WITH RETROGRADE PYELOGRAM, URETEROSCOPY AND LEFT STENT PLACEMENT;  Surgeon: Alexis Frock, MD;  Location: [facility];  Service: Urology;  Laterality: Left;   ENDOMETRIAL ABLATION     EYE SURGERY Bilateral    cataract extractions   IR KYPHO THORACIC WITH BONE BIOPSY  09/03/2021   IR RADIOLOGIST EVAL & MGMT  08/26/2021   IR RADIOLOGIST EVAL & MGMT  09/18/2021   KYPHOPLASTY N/A 07/23/2016   Procedure: KYPHOPLASTY T11;  Surgeon: Hessie Knows, MD;  Location: [facility];  Service: Orthopedics;  Laterality: N/A;   KYPHOPLASTY N/A 03/21/2019   Procedure: L3 KYPHOPLASTY;  Surgeon: Hessie Knows, MD;  Location: [facility];  Service: Orthopedics;  Laterality: N/A;   LAPAROSCOPIC CHOLECYSTECTOMY  1998   PUBOVAGINAL SLING  1995   TUBAL LIGATION     UPPER GI ENDOSCOPY     Social History:  reports that she has never smoked. She has never used smokeless tobacco. She reports that she does not drink alcohol and does not use drugs.  Allergies  Allergen Reactions   Codeine Hives   Meloxicam Other (See Comments)    Causes dizziness   Sulfa Antibiotics Hives    Family History  Problem Relation Age of Onset   Hypertension Mother    Heart disease Father    Cancer Sister        metastatic liver Ca   Heart disease Brother    Cancer Brother        Lung    Prior to Admission medications  Medication Sig Start Date End Date Taking? Authorizing Provider  aspirin EC 81 MG EC tablet Take 1 tablet (81 mg total) by mouth daily. Swallow whole. 03/02/21 04/20/22  Terrilee Croak, MD  Azelaic Acid 15 % gel Apply 1 application topically 2 (two) times daily.     [provider]  Calcium Carb-Cholecalciferol (CALCIUM 600 + D PO) Take 2 tablets by mouth daily.    [provider]  ciprofloxacin-dexamethasone (CIPRODEX) OTIC suspension Place 4 drops into the right ear 2 (two) times daily. 04/23/22   Sharen Hones, MD  Flaxseed, Linseed, (BIO-FLAX) 1000 MG CAPS Take 1,000 mg by mouth daily.    [provider]  fluocinonide (LIDEX) 0.05 % external solution Apply 1 Application topically 2 (two) times  daily. 02/20/22   [provider]  ibandronate (BONIVA) 150 MG tablet Take 150 mg by mouth every 30 (thirty) days. 03/30/22   [provider]  latanoprost (XALATAN) 0.005 % ophthalmic solution Place 1 drop into both eyes at bedtime. 07/18/16   [provider]  mirabegron ER (MYRBETRIQ) 25 MG TB24 tablet Take 25 mg by mouth daily.  12/14/17   [provider]  Multiple Vitamins-Minerals (MULTIVITAMIN WITH MINERALS) tablet Take 1 tablet by mouth daily.    [provider]  NEXIUM 40 MG capsule TAKE ONE CAPSULE BY MOUTH ONCE A DAY BEFORE BREAKFAST 05/19/13   Crecencio Mc, MD  timolol (TIMOPTIC) 0.5 % ophthalmic solution Place 1 drop into both eyes 2 (two) times daily. 06/30/16   [provider]  vitamin B-12 (CYANOCOBALAMIN) 500 MCG tablet Take 500 mcg by mouth daily.    [provider]    Physical Exam: Vitals:   08/14/22 1416 08/14/22 1500 08/14/22 1611 08/14/22 1615  BP: (!) 132/54 (!) 113/45  (!) 105/47  Pulse: (!) 59 (!) 59  (!) 59  Resp: 15     Temp:   98.2 F (36.8 C)   TempSrc:   Oral   SpO2: 99% 96%  97%   General:  Appears calm and comfortable and is in NAD Eyes:  EOMI, normal lids, iris ENT:  grossly normal hearing, lips & tongue, mmm; appropriate dentition Neck:  no LAD, masses or thyromegaly Cardiovascular:  RRR, no m/r/g. No LE edema.  Respiratory:   CTA bilaterally with no wheezes/rales/rhonchi.  Normal respiratory effort. Abdomen:  soft, NT, ND Skin:  no rash or induration seen on limited exam Musculoskeletal:  grossly normal tone BUE/BLE, good ROM, no bony abnormality Psychiatric:  grossly normal mood and affect, speech fluent and appropriate, AOx3 Neurologic:  CN 2-12 grossly intact, moves all extremities in coordinated fashion   Radiological Exams on Admission: Independently reviewed - see discussion in A/P where applicable  MR BRAIN WO CONTRAST  Result Date: 08/14/2022 CLINICAL DATA:   Syncope/presyncope, cerebrovascular cause suspected; acute onset vertigo. EXAM: MRI HEAD WITHOUT CONTRAST TECHNIQUE: Multiplanar, multiecho pulse sequences of the brain and surrounding structures were obtained without intravenous contrast. COMPARISON:  Head CT April 21, 2019. MRI of the brain February 27, 2021. FINDINGS: Brain: No acute infarction, hemorrhage, hydrocephalus, extra-axial collection or mass lesion. Area of encephalomalacia and gliosis in the right occipital lobe consistent with known remote right PCA territory infarct. Scattered foci of T2 hyperintensity are seen within the white matter of the cerebral hemispheres, nonspecific, most likely related to chronic small vessel ischemia. This has not significantly changed compared to prior MRI. Vascular: Normal flow voids. Skull and upper cervical spine: Normal marrow signal. Sinuses/Orbits: Negative. Other: None.  IMPRESSION: 1. No acute intracranial abnormality. 2. Remote right PCA territory infarct. 3. Mild chronic microvascular ischemic changes of the white matter, stable. Electronically Signed   By: Baldemar Lenis M.D.   On: 08/14/2022 13:32    EKG: Independently reviewed.  NSR with rate 61; low voltage with no evidence of acute ischemia   Labs on Admission: I have personally reviewed the available labs and imaging studies at the time of the admission.  Pertinent labs:    Normal BMP Normal CBC UA: moderate LE   Assessment and Plan: Principal Problem:   Vertigo Active Problems:   Glaucoma (increased eye pressure)   Urinary incontinence   DNR (do not resuscitate)    Vertigo -Patient presenting with vertigo -Unable to ambulate in the ER due to the severity of her symptoms -MRI was negative, ruling out CVA as cause for her symptoms -Will observe on Med tele overnight -PT consult for vestibular evaluation -Continue Valium 5 mg PO q6h prn for now -Meclizine wasn't particularly helpful in the AM but will continue for  now prn  Urinary incontinence -Continue Myrbetriq - although she reports that this is not overly effective  Glaucoma  -Continue latanoprost, timolol  DNR -I have discussed code status with the patient and she would not desire resuscitation and would prefer to die a natural death should that situation arise. -She will need a gold out of facility DNR form at the time of discharge     Advance Care Planning:   Code Status: DNR   Consults: PT  DVT Prophylaxis: Lovenox  Family Communication: None present; I spoke with her granddaughter by telephone at the time of admission  Severity of Illness: The appropriate patient status for this patient is OBSERVATION. Observation status is judged to be reasonable and necessary in order to provide the required intensity of service to ensure the patient's safety. The patient's presenting symptoms, physical exam findings, and initial radiographic and laboratory data in the context of their medical condition is felt to place them at decreased risk for further clinical deterioration. Furthermore, it is anticipated that the patient will be medically stable for discharge from the hospital within 2 midnights of admission.   Author: Jonah Blue, MD 08/14/2022 4:53 PM  For on call review [email or web address].

## 2022-08-14 NOTE — ED Provider Notes (Signed)
Clinical Course as of 08/14/22 2321  Fri Aug 14, 2022  1512 Stable 65 YOF with vertiginous symptoms x 1 week. MRI ok. Cannot safely ambulate. Trialed ambulation.  [CC]    Clinical Course User Index [CC] Tretha Sciara, MD   Admit medically.   Tretha Sciara, MD 08/14/22 2321

## 2022-08-15 DIAGNOSIS — R42 Dizziness and giddiness: Secondary | ICD-10-CM | POA: Diagnosis not present

## 2022-08-15 LAB — CBC
HCT: 41.5 % (ref 36.0–46.0)
Hemoglobin: 13.5 g/dL (ref 12.0–15.0)
MCH: 31.5 pg (ref 26.0–34.0)
MCHC: 32.5 g/dL (ref 30.0–36.0)
MCV: 97 fL (ref 80.0–100.0)
Platelets: 230 10*3/uL (ref 150–400)
RBC: 4.28 MIL/uL (ref 3.87–5.11)
RDW: 12.7 % (ref 11.5–15.5)
WBC: 9.4 10*3/uL (ref 4.0–10.5)
nRBC: 0 % (ref 0.0–0.2)

## 2022-08-15 LAB — URINE CULTURE: Culture: <10000 — AB

## 2022-08-15 LAB — BASIC METABOLIC PANEL
Anion gap: 9 (ref 5–15)
BUN: 14 mg/dL (ref 8–23)
CO2: 26 mmol/L (ref 22–32)
Calcium: 9 mg/dL (ref 8.9–10.3)
Chloride: 105 mmol/L (ref 98–111)
Creatinine, Ser: 0.92 mg/dL (ref 0.44–1.00)
GFR, Estimated: >60 mL/min (ref >60)
Glucose, Bld: 107 mg/dL — ABNORMAL HIGH (ref 70–99)
Potassium: 3.8 mmol/L (ref 3.5–5.1)
Sodium: 140 mmol/L (ref 135–145)

## 2022-08-15 MED ORDER — MECLIZINE HCL 25 MG PO TABS: 25.0000 mg | ORAL_TABLET | Freq: Three times a day (TID) | ORAL | 0 refills | Status: AC | PRN

## 2022-08-15 MED ORDER — NONFORMULARY OR COMPOUNDED ITEM: 0 refills | Status: AC

## 2022-08-15 MED ORDER — ONDANSETRON HCL 4 MG PO TABS: 4.0000 mg | ORAL_TABLET | Freq: Four times a day (QID) | ORAL | 0 refills | Status: AC | PRN

## 2022-08-15 NOTE — TOC Transition Note (Signed)
Transition of Care [facility]) - CM/SW Discharge Note   Patient Details  Name: DESHON KOSLOWSKI MRN: [id number] Date of Birth: 27-Mar-1941  Transition of Care [facility]) CM/SW Contact:  Carles Collet, RN Phone Number: 08/15/2022, 2:42 PM   Clinical Narrative:     Spoke to patient over the phone. She would like referral to OP PT. Discussed locations by her home and choice was made for OP [place] on [address]. [place] [id number] placed through [facility]. Patient understands to call office if no call in the next week from them to schedule. This has been added to her AVS.  She states her granddaughter will provide transportation home  Final next level of care: Home/Self Care Barriers to Discharge: No Barriers Identified   Patient Goals and CMS Choice CMS Medicare.gov Compare Post Acute Care list provided to:: Patient Choice offered to / list presented to : Patient  Discharge Placement                         Discharge Plan and Services Additional resources added to the After Visit Summary for                  DME Arranged: N/A                    Social Determinants of Health (SDOH) Interventions SDOH Screenings   Food Insecurity: No Food Insecurity (08/14/2022)  Housing: Low Risk  (08/14/2022)  Transportation Needs: No Transportation Needs (08/14/2022)  Utilities: Not At Risk (08/14/2022)  Tobacco Use: Low Risk  (08/14/2022)     Readmission Risk Interventions     No data to display

## 2022-08-15 NOTE — Discharge Summary (Signed)
PATIENT DETAILS Name: Tiffany Mcbride Age: 82 y.o. Sex: female Date of Birth: [birth year] MRN: [id number]. Admitting Physician: Jonah Blue, MD [id number], Marya Amsler, MD  Admit Date: 08/14/2022 Discharge date: 08/15/2022  Recommendations for Outpatient Follow-up:  Follow up with PCP in 1-2 weeks Please obtain CMP/CBC in one week  Admitted From:  Home  Disposition: Outpatient PT   Discharge Condition: good  CODE STATUS:   Code Status: DNR   Diet recommendation:  Diet Order             Diet - low sodium heart healthy           Diet Heart Room service appropriate? Yes; Fluid consistency: Thin  Diet effective now                    Brief Summary: Patient is a 82 y.o.  female with history of HLD, prior CVA, glaucoma who presented with vertigo-mostly associated with positional changes.  Thought to have BPPV and subsequently admitted to the hospitalist service   Significant events: 1/12>> admit to [facility]-vertigo   Significant studies: 1/12>> MRI brain: No acute CVA   Significant microbiology data: 1/12>> urine culture: Pending   Procedures: None   Consults: None  Brief Hospital Course: Vertigo Likely peripheral-probably BPPV Nonfocal exam-no nystagmus Evaluated by vestibular PT-spoke with physical therapist-she did well-completed Epley's maneuver x 2.  Recommendations are for outpatient vestibular PT. Continue as needed meclizine.   BMI: Estimated body mass index is 36.98 kg/m as calculated from the following:   Height as of this encounter: 5\' 2"  (1.575 m).   Weight as of this encounter: 91.7 kg  Rest of medical issues were stable during his short overnight hospitalization.    Obesity: Estimated body mass index is 36.98 kg/m as calculated from the following:   Height as of this encounter: 5\' 2"  (1.575 m).   Weight as of this encounter: 91.7 kg.   Discharge Diagnoses:  Principal Problem:   Vertigo Active Problems:   Glaucoma (increased  eye pressure)   Urinary incontinence   DNR (do not resuscitate)   Discharge Instructions:  Activity:  As tolerated   Discharge Instructions     Call MD for:  extreme fatigue   Complete by: As directed    Call MD for:  persistant dizziness or light-headedness   Complete by: As directed    Diet - low sodium heart healthy   Complete by: As directed    Discharge instructions   Complete by: As directed    Follow with Primary MD  , MD in 1-2 weeks  Please get a complete blood count and chemistry panel checked by your Primary MD at your next visit, and again as instructed by your Primary MD.  Get Medicines reviewed and adjusted: Please take all your medications with you for your next visit with your Primary MD  Laboratory/radiological data: Please request your Primary MD to go over all hospital tests and procedure/radiological results at the follow up, please ask your Primary MD to get all Hospital records sent to his/her office.  In some cases, they will be blood work, cultures and biopsy results pending at the time of your discharge. Please request that your primary care M.D. follows up on these results.  Also Note the following: If you experience worsening of your admission symptoms, develop shortness of breath, life threatening emergency, suicidal or homicidal thoughts you must seek medical attention immediately by calling 911 or calling your MD immediately  if symptoms less severe.  You must read complete instructions/literature along with all the possible adverse reactions/side effects for all the Medicines you take and that have been prescribed to you. Take any new Medicines after you have completely understood and accpet all the possible adverse reactions/side effects.   Do not drive when taking Pain medications or sleeping medications (Benzodaizepines)  Do not take more than prescribed Pain, Sleep and Anxiety Medications. It is not advisable to combine  anxiety,sleep and pain medications without talking with your primary care practitioner  Special Instructions: If you have smoked or chewed Tobacco  in the last 2 yrs please stop smoking, stop any regular Alcohol  and or any Recreational drug use.  Wear Seat belts while driving.  Please note: You were cared for by a hospitalist during your hospital stay. Once you are discharged, your primary care physician will handle any further medical issues. Please note that NO REFILLS for any discharge medications will be authorized once you are discharged, as it is imperative that you return to your primary care physician (or establish a relationship with a primary care physician if you do not have one) for your post hospital discharge needs so that they can reassess your need for medications and monitor your lab values.   Increase activity slowly   Complete by: As directed       Allergies as of 08/15/2022       Reactions   Codeine Hives   Meloxicam Other (See Comments)   Causes dizziness   Sulfa Antibiotics Hives        Medication List     STOP taking these medications    ciprofloxacin-dexamethasone OTIC suspension Commonly known as: CIPRODEX       TAKE these medications    aspirin EC 81 MG tablet Take 1 tablet (81 mg total) by mouth daily. Swallow whole.   atorvastatin 20 MG tablet Commonly known as: LIPITOR Take 1 tablet by mouth at bedtime.   Azelaic Acid 15 % gel Apply 1 application topically 2 (two) times daily.   Bio-Flax 1000 MG Caps Take 1,000 mg by mouth daily.   CALCIUM 600 + D PO Take 2 tablets by mouth daily.   cyanocobalamin 500 MCG tablet Commonly known as: VITAMIN B12 Take 500 mcg by mouth daily.   fluocinonide 0.05 % external solution Commonly known as: LIDEX Apply 1 Application topically 2 (two) times daily.   ibandronate 150 MG tablet Commonly known as: BONIVA Take 150 mg by mouth every 30 (thirty) days.   latanoprost 0.005 % ophthalmic  solution Commonly known as: XALATAN Place 1 drop into both eyes at bedtime.   meclizine 25 MG tablet Commonly known as: ANTIVERT Take 1 tablet (25 mg total) by mouth 3 (three) times daily as needed for dizziness.   mirabegron ER 25 MG Tb24 tablet Commonly known as: MYRBETRIQ Take 25 mg by mouth daily.   multivitamin with minerals tablet Take 1 tablet by mouth daily.   NexIUM 40 MG capsule Generic drug: esomeprazole TAKE ONE CAPSULE BY MOUTH ONCE A DAY BEFORE BREAKFAST What changed: See the new instructions.   NONFORMULARY OR COMPOUNDED ITEM Please evaluate and treat for vestibular PT Diagnoses: Vertigo secondary to BPPV   ondansetron 4 MG tablet Commonly known as: ZOFRAN Take 1 tablet (4 mg total) by mouth every 6 (six) hours as needed for nausea.   timolol 0.5 % ophthalmic solution Commonly known as: TIMOPTIC Place 1 drop into both eyes 2 (two) times daily.  Follow-up Information     Lauro Regulus, MD. Schedule an appointment as soon as possible for a visit in 1 week(s).   Specialty: Internal Medicine Contact information: [address] [facility] [place] [address] [phone]                Allergies  Allergen Reactions   Codeine Hives   Meloxicam Other (See Comments)    Causes dizziness   Sulfa Antibiotics Hives     Other Procedures/Studies: MR BRAIN WO CONTRAST  Result Date: 08/14/2022 CLINICAL DATA:  Syncope/presyncope, cerebrovascular cause suspected; acute onset vertigo. EXAM: MRI HEAD WITHOUT CONTRAST TECHNIQUE: Multiplanar, multiecho pulse sequences of the brain and surrounding structures were obtained without intravenous contrast. COMPARISON:  Head CT April 21, 2019. MRI of the brain February 27, 2021. FINDINGS: Brain: No acute infarction, hemorrhage, hydrocephalus, extra-axial collection or mass lesion. Area of encephalomalacia and gliosis in the right occipital lobe consistent with known remote right PCA  territory infarct. Scattered foci of T2 hyperintensity are seen within the white matter of the cerebral hemispheres, nonspecific, most likely related to chronic small vessel ischemia. This has not significantly changed compared to prior MRI. Vascular: Normal flow voids. Skull and upper cervical spine: Normal marrow signal. Sinuses/Orbits: Negative. Other: None. IMPRESSION: 1. No acute intracranial abnormality. 2. Remote right PCA territory infarct. 3. Mild chronic microvascular ischemic changes of the white matter, stable. Electronically Signed   By: Baldemar Lenis M.D.   On: 08/14/2022 13:32     TODAY-DAY OF DISCHARGE:  Subjective:   Fern Canova today has no headache,no chest abdominal pain,no new weakness tingling or numbness, feels much better wants to go home today.   Objective:   Blood pressure (!) 118/44, pulse 68, temperature 97.9 F (36.6 C), temperature source Oral, resp. rate 16, height 5\' 2"  (1.575 m), weight 91.7 kg, SpO2 98 %. No intake or output data in the 24 hours ending 08/15/22 1405 Filed Weights   08/14/22 2155  Weight: 91.7 kg    Exam: Awake Alert, Oriented *3, No new F.N deficits, Normal affect [place].AT,PERRAL Supple Neck,No JVD, No cervical lymphadenopathy appriciated.  Symmetrical Chest wall movement, Good air movement bilaterally, CTAB RRR,No Gallops,Rubs or new Murmurs, No Parasternal Heave +ve B.Sounds, Abd Soft, Non tender, No organomegaly appriciated, No rebound -guarding or rigidity. No Cyanosis, Clubbing or edema, No new Rash or bruise   PERTINENT RADIOLOGIC STUDIES: MR BRAIN WO CONTRAST  Result Date: 08/14/2022 CLINICAL DATA:  Syncope/presyncope, cerebrovascular cause suspected; acute onset vertigo. EXAM: MRI HEAD WITHOUT CONTRAST TECHNIQUE: Multiplanar, multiecho pulse sequences of the brain and surrounding structures were obtained without intravenous contrast. COMPARISON:  Head CT April 21, 2019. MRI of the brain February 27, 2021.  FINDINGS: Brain: No acute infarction, hemorrhage, hydrocephalus, extra-axial collection or mass lesion. Area of encephalomalacia and gliosis in the right occipital lobe consistent with known remote right PCA territory infarct. Scattered foci of T2 hyperintensity are seen within the white matter of the cerebral hemispheres, nonspecific, most likely related to chronic small vessel ischemia. This has not significantly changed compared to prior MRI. Vascular: Normal flow voids. Skull and upper cervical spine: Normal marrow signal. Sinuses/Orbits: Negative. Other: None. IMPRESSION: 1. No acute intracranial abnormality. 2. Remote right PCA territory infarct. 3. Mild chronic microvascular ischemic changes of the white matter, stable. Electronically Signed   By: March 01, 2021 M.D.   On: 08/14/2022 13:32     PERTINENT LAB RESULTS: CBC: Recent Labs  08/14/22 1218 08/15/22 0220  WBC 9.3 9.4  HGB 14.4 13.5  HCT 42.1 41.5  PLT 245 230   CMET CMP     Component Value Date/Time   NA 140 08/15/2022 0220   NA 137 01/16/2014 1246   K 3.8 08/15/2022 0220   K 4.0 01/16/2014 1246   CL 105 08/15/2022 0220   CL 107 01/16/2014 1246   CO2 26 08/15/2022 0220   CO2 27 01/16/2014 1246   GLUCOSE 107 (H) 08/15/2022 0220   GLUCOSE 127 (H) 01/16/2014 1246   BUN 14 08/15/2022 0220   BUN 21 (H) 01/16/2014 1246   CREATININE 0.92 08/15/2022 0220   CREATININE 0.97 01/16/2014 1246   CALCIUM 9.0 08/15/2022 0220   CALCIUM 9.2 01/16/2014 1246   PROT 7.3 04/23/2022 0511   PROT 7.7 01/16/2014 1246   ALBUMIN 3.8 04/23/2022 0511   ALBUMIN 4.0 01/16/2014 1246   AST 29 04/23/2022 0511   AST 35 01/16/2014 1246   ALT 26 04/23/2022 0511   ALT 31 01/16/2014 1246   ALKPHOS 50 04/23/2022 0511   ALKPHOS 97 01/16/2014 1246   BILITOT 0.7 04/23/2022 0511   BILITOT 0.5 01/16/2014 1246   GFRNONAA >60 08/15/2022 0220   GFRNONAA 59 (L) 01/16/2014 1246   GFRAA >60 12/29/2019 1508   GFRAA >60 01/16/2014 1246     GFR Estimated Creatinine Clearance: 52.2 mL/min (by C-G formula based on SCr of 0.92 mg/dL). No results for input(s): "LIPASE", "AMYLASE" in the last 72 hours. No results for input(s): "CKTOTAL", "CKMB", "CKMBINDEX", "TROPONINI" in the last 72 hours. Invalid input(s): "POCBNP" No results for input(s): "DDIMER" in the last 72 hours. No results for input(s): "HGBA1C" in the last 72 hours. No results for input(s): "CHOL", "HDL", "LDLCALC", "TRIG", "CHOLHDL", "LDLDIRECT" in the last 72 hours. No results for input(s): "TSH", "T4TOTAL", "T3FREE", "THYROIDAB" in the last 72 hours.  Invalid input(s): "FREET3" No results for input(s): "VITAMINB12", "FOLATE", "FERRITIN", "TIBC", "IRON", "RETICCTPCT" in the last 72 hours. Coags: No results for input(s): "INR" in the last 72 hours.  Invalid input(s): "PT" Microbiology: Recent Results (from the past 240 hour(s))  Urine Culture     Status: Abnormal   Collection Time: 08/14/22  1:31 PM   Specimen: Urine, Clean Catch  Result Value Ref Range Status   Specimen Description URINE, CLEAN CATCH  Final   Special Requests NONE  Final   Culture (A)  Final    <10,000 COLONIES/mL INSIGNIFICANT GROWTH Performed at [facility], [address] [place], [place] [address]    Report Status 08/15/2022 FINAL  Final    FURTHER DISCHARGE INSTRUCTIONS:  Get Medicines reviewed and adjusted: Please take all your medications with you for your next visit with your Primary MD  Laboratory/radiological data: Please request your Primary MD to go over all hospital tests and procedure/radiological results at the follow up, please ask your Primary MD to get all Hospital records sent to his/her office.  In some cases, they will be blood work, cultures and biopsy results pending at the time of your discharge. Please request that your primary care M.D. goes through all the records of your hospital data and follows up on these results.  Also Note the  following: If you experience worsening of your admission symptoms, develop shortness of breath, life threatening emergency, suicidal or homicidal thoughts you must seek medical attention immediately by calling 911 or calling your MD immediately  if symptoms less severe.  You must read complete instructions/literature along with all  the possible adverse reactions/side effects for all the Medicines you take and that have been prescribed to you. Take any new Medicines after you have completely understood and accpet all the possible adverse reactions/side effects.   Do not drive when taking Pain medications or sleeping medications (Benzodaizepines)  Do not take more than prescribed Pain, Sleep and Anxiety Medications. It is not advisable to combine anxiety,sleep and pain medications without talking with your primary care practitioner  Special Instructions: If you have smoked or chewed Tobacco  in the last 2 yrs please stop smoking, stop any regular Alcohol  and or any Recreational drug use.  Wear Seat belts while driving.  Please note: You were cared for by a hospitalist during your hospital stay. Once you are discharged, your primary care physician will handle any further medical issues. Please note that NO REFILLS for any discharge medications will be authorized once you are discharged, as it is imperative that you return to your primary care physician (or establish a relationship with a primary care physician if you do not have one) for your post hospital discharge needs so that they can reassess your need for medications and monitor your lab values.  Total Time spent coordinating discharge including counseling, education and face to face time equals greater than 30 minutes.  SignedJeoffrey Massed 08/15/2022 2:05 PM

## 2022-08-15 NOTE — Progress Notes (Signed)
Nsg Discharge Note  Admit Date:  08/14/2022 Discharge date: 08/15/2022   Tiffany Mcbride to be D/C'd Home per MD order.  AVS completed. Patient/caregiver able to verbalize understanding.  Discharge Medication: Allergies as of 08/15/2022       Reactions   Codeine Hives   Meloxicam Other (See Comments)   Causes dizziness   Sulfa Antibiotics Hives        Medication List     STOP taking these medications    ciprofloxacin-dexamethasone OTIC suspension Commonly known as: CIPRODEX       TAKE these medications    aspirin EC 81 MG tablet Take 1 tablet (81 mg total) by mouth daily. Swallow whole.   atorvastatin 20 MG tablet Commonly known as: LIPITOR Take 1 tablet by mouth at bedtime.   Azelaic Acid 15 % gel Apply 1 application topically 2 (two) times daily.   Bio-Flax 1000 MG Caps Take 1,000 mg by mouth daily.   CALCIUM 600 + D PO Take 2 tablets by mouth daily.   cyanocobalamin 500 MCG tablet Commonly known as: VITAMIN B12 Take 500 mcg by mouth daily.   fluocinonide 0.05 % external solution Commonly known as: LIDEX Apply 1 Application topically 2 (two) times daily.   ibandronate 150 MG tablet Commonly known as: BONIVA Take 150 mg by mouth every 30 (thirty) days.   latanoprost 0.005 % ophthalmic solution Commonly known as: XALATAN Place 1 drop into both eyes at bedtime.   meclizine 25 MG tablet Commonly known as: ANTIVERT Take 1 tablet (25 mg total) by mouth 3 (three) times daily as needed for dizziness.   mirabegron ER 25 MG Tb24 tablet Commonly known as: MYRBETRIQ Take 25 mg by mouth daily.   multivitamin with minerals tablet Take 1 tablet by mouth daily.   NexIUM 40 MG capsule Generic drug: esomeprazole TAKE ONE CAPSULE BY MOUTH ONCE A DAY BEFORE BREAKFAST What changed: See the new instructions.   NONFORMULARY OR COMPOUNDED ITEM Please evaluate and treat for vestibular PT Diagnoses: Vertigo secondary to BPPV   ondansetron 4 MG tablet Commonly  known as: ZOFRAN Take 1 tablet (4 mg total) by mouth every 6 (six) hours as needed for nausea.   timolol 0.5 % ophthalmic solution Commonly known as: TIMOPTIC Place 1 drop into both eyes 2 (two) times daily.        Discharge Assessment: Vitals:   08/15/22 0831 08/15/22 1237  BP: 133/75 (!) 118/44  Pulse: 65 68  Resp: 16 16  Temp: (!) 97.5 F (36.4 C) 97.9 F (36.6 C)  SpO2: 97% 98%   Skin clean, dry and intact without evidence of skin break down, no evidence of skin tears noted. IV catheter discontinued intact. Site without signs and symptoms of complications - no redness or edema noted at insertion site, patient denies c/o pain - only slight tenderness at site.  Dressing with slight pressure applied.  D/c Instructions-Education: Discharge instructions given to patient/family with verbalized understanding. D/c education completed with patient/family including follow up instructions, medication list, d/c activities limitations if indicated, with other d/c instructions as indicated by MD - patient able to verbalize understanding, all questions fully answered. Patient instructed to return to ED, call 911, or call MD for any changes in condition.  Patient escorted via [place], and D/C home via private auto.  Atilano Ina, RN 08/15/2022 3:27 PM

## 2022-08-15 NOTE — Discharge Instructions (Signed)
Recommendations for Outpatient Follow-up:  Follow up with PCP in 1-2 weeks Please obtain CMP/CBC in one week 

## 2022-08-15 NOTE — Progress Notes (Signed)
Pt does not have a legal guardian  °

## 2022-08-15 NOTE — Progress Notes (Signed)
PROGRESS NOTE        PATIENT DETAILS Name: Tiffany Mcbride Age: 82 y.o. Sex: female Date of Birth: 06-Aug-1940 Admit Date: 08/14/2022 Admitting Physician Karmen Bongo, MD YDX:[id number], Ocie Cornfield, MD  Brief Summary: Patient is a 82 y.o.  female with history of HLD, prior CVA, glaucoma who presented with vertigo-mostly associated with positional changes.  Thought to have BPPV and subsequently admitted to the hospitalist service  Significant events: 1/12>> admit to [facility]-vertigo  Significant studies: 1/12>> MRI brain: No acute CVA  Significant microbiology data: 1/12>> urine culture: Pending  Procedures: None  Consults: None  Subjective: Continues to have vertigo.  Objective: Vitals: Blood pressure 133/75, pulse 65, temperature (!) 97.5 F (36.4 C), temperature source Oral, resp. rate 16, height 5\' 2"  (1.575 m), weight 91.7 kg, SpO2 97 %.   Exam: Gen Exam:Alert awake-not in any distress HEENT:atraumatic, normocephalic Chest: B/L clear to auscultation anteriorly CVS:S1S2 regular Abdomen:soft non tender, non distended Extremities:no edema Neurology: Non focal Skin: no rash  Pertinent Labs/Radiology:    Latest Ref Rng & Units 08/15/2022    2:20 AM 08/14/2022   12:18 PM 04/23/2022    5:11 AM  CBC  WBC 4.0 - 10.5 K/uL 9.4  9.3  9.9   Hemoglobin 12.0 - 15.0 g/dL 13.5  14.4  12.9   Hematocrit 36.0 - 46.0 % 41.5  42.1  39.5   Platelets 150 - 400 K/uL 230  245  280     Lab Results  Component Value Date   NA 140 08/15/2022   K 3.8 08/15/2022   CL 105 08/15/2022   CO2 26 08/15/2022      Assessment/Plan: Vertigo Likely peripheral-probably BPPV Nonfocal exam-no nystagmus Await vestibular PT evaluation In interim-continue with as needed benzos/meclizine  BMI: Estimated body mass index is 36.98 kg/m as calculated from the following:   Height as of this encounter: 5\' 2"  (1.575 m).   Weight as of this encounter: 91.7 kg.   Code  status:   Code Status: DNR   DVT Prophylaxis: enoxaparin (LOVENOX) injection 40 mg Start: 08/14/22 1700   Family Communication: None at bedside   Disposition Plan: Status is: Observation The patient remains OBS appropriate and will d/c before 2 midnights.   Planned Discharge Destination:Home   Diet: Diet Order             Diet Heart Room service appropriate? Yes; Fluid consistency: Thin  Diet effective now                     Antimicrobial agents: Anti-infectives (From admission, onward)    None        MEDICATIONS: Scheduled Meds:  aspirin EC  81 mg Oral Daily   docusate sodium  100 mg Oral BID   enoxaparin (LOVENOX) injection  40 mg Subcutaneous Q24H   latanoprost  1 drop Both Eyes QHS   mirabegron ER  25 mg Oral Daily   pantoprazole  40 mg Oral Daily   sodium chloride flush  3 mL Intravenous Q12H   timolol  1 drop Both Eyes BID   Continuous Infusions: PRN Meds:.acetaminophen **OR** acetaminophen, bisacodyl, diazepam, hydrALAZINE, meclizine, ondansetron **OR** ondansetron (ZOFRAN) IV, mouth rinse, oxyCODONE, polyethylene glycol   I have personally reviewed following labs and imaging studies  LABORATORY DATA: CBC: Recent Labs  Lab 08/14/22 1218 08/15/22 0220  WBC  9.3 9.4  NEUTROABS 5.2  --   HGB 14.4 13.5  HCT 42.1 41.5  MCV 95.7 97.0  PLT 245 315    Basic Metabolic Panel: Recent Labs  Lab 08/14/22 1218 08/15/22 0220  NA 139 140  K 4.2 3.8  CL 105 105  CO2 24 26  GLUCOSE 95 107*  BUN 14 14  CREATININE 0.90 0.92  CALCIUM 9.4 9.0    GFR: Estimated Creatinine Clearance: 52.2 mL/min (by C-G formula based on SCr of 0.92 mg/dL).  Liver Function Tests: No results for input(s): "AST", "ALT", "ALKPHOS", "BILITOT", "PROT", "ALBUMIN" in the last 168 hours. No results for input(s): "LIPASE", "AMYLASE" in the last 168 hours. No results for input(s): "AMMONIA" in the last 168 hours.  Coagulation Profile: No results for input(s): "INR",  "PROTIME" in the last 168 hours.  Cardiac Enzymes: No results for input(s): "CKTOTAL", "CKMB", "CKMBINDEX", "TROPONINI" in the last 168 hours.  BNP (last 3 results) No results for input(s): "PROBNP" in the last 8760 hours.  Lipid Profile: No results for input(s): "CHOL", "HDL", "LDLCALC", "TRIG", "CHOLHDL", "LDLDIRECT" in the last 72 hours.  Thyroid Function Tests: No results for input(s): "TSH", "T4TOTAL", "FREET4", "T3FREE", "THYROIDAB" in the last 72 hours.  Anemia Panel: No results for input(s): "VITAMINB12", "FOLATE", "FERRITIN", "TIBC", "IRON", "RETICCTPCT" in the last 72 hours.  Urine analysis:    Component Value Date/Time   COLORURINE YELLOW 08/14/2022 1331   APPEARANCEUR CLEAR 08/14/2022 1331   APPEARANCEUR Cloudy 02/09/2013 1058   LABSPEC 1.008 08/14/2022 1331   LABSPEC 1.030 02/09/2013 1058   PHURINE 5.0 08/14/2022 1331   GLUCOSEU NEGATIVE 08/14/2022 1331   GLUCOSEU Negative 02/09/2013 1058   HGBUR NEGATIVE 08/14/2022 1331   BILIRUBINUR NEGATIVE 08/14/2022 1331   BILIRUBINUR Negative 02/09/2013 1058   KETONESUR NEGATIVE 08/14/2022 1331   PROTEINUR NEGATIVE 08/14/2022 1331   UROBILINOGEN 0.2 02/08/2012 1558   NITRITE NEGATIVE 08/14/2022 1331   LEUKOCYTESUR MODERATE (A) 08/14/2022 1331   LEUKOCYTESUR 1+ 02/09/2013 1058    Sepsis Labs: Lactic Acid, Venous No results found for: "LATICACIDVEN"  MICROBIOLOGY: No results found for this or any previous visit (from the past 240 hour(s)).  RADIOLOGY STUDIES/RESULTS: MR BRAIN WO CONTRAST  Result Date: 08/14/2022 CLINICAL DATA:  Syncope/presyncope, cerebrovascular cause suspected; acute onset vertigo. EXAM: MRI HEAD WITHOUT CONTRAST TECHNIQUE: Multiplanar, multiecho pulse sequences of the brain and surrounding structures were obtained without intravenous contrast. COMPARISON:  Head CT April 21, 2019. MRI of the brain February 27, 2021. FINDINGS: Brain: No acute infarction, hemorrhage, hydrocephalus, extra-axial  collection or mass lesion. Area of encephalomalacia and gliosis in the right occipital lobe consistent with known remote right PCA territory infarct. Scattered foci of T2 hyperintensity are seen within the white matter of the cerebral hemispheres, nonspecific, most likely related to chronic small vessel ischemia. This has not significantly changed compared to prior MRI. Vascular: Normal flow voids. Skull and upper cervical spine: Normal marrow signal. Sinuses/Orbits: Negative. Other: None. IMPRESSION: 1. No acute intracranial abnormality. 2. Remote right PCA territory infarct. 3. Mild chronic microvascular ischemic changes of the white matter, stable. Electronically Signed   By: Pedro Earls M.D.   On: 08/14/2022 13:32     LOS: 0 days   Oren Binet, MD  [facility]    To contact the attending provider between 7A-7P or the covering provider during after hours 7P-7A, please log into the web site [email or web address] and access using universal [place] password for that web site. If you do not have  the password, please call the hospital operator.  08/15/2022, 12:05 PM

## 2022-08-18 NOTE — Therapy (Signed)
OUTPATIENT PHYSICAL THERAPY VESTIBULAR EVALUATION     Patient Name: Tiffany Mcbride MRN: [id number] DOB:[birth year], 82 y.o., female Today's Date: 08/20/2022  END OF SESSION:  PT End of Session - 08/20/22 0941     Visit Number 1    Number of Visits 25    Date for PT Re-Evaluation 11/12/22    PT Start Time 0933    PT Stop Time 1032    PT Time Calculation (min) 59 min    Equipment Utilized During Treatment Gait belt    Activity Tolerance Patient tolerated treatment well    Behavior During Therapy WFL for tasks assessed/performed             Past Medical History:  Diagnosis Date   Arthritis    At high risk for falls    GERD (gastroesophageal reflux disease)    Glaucoma of both eyes    Heart murmur    wen she was in her 30's   History of gastric ulcer    History of kidney stones    Hyperlipidemia    Left ureteral calculus    Stroke [facility])    TIA (transient ischemic attack) 01/2021   Urgency of urination    Past Surgical History:  Procedure Laterality Date   BACK SURGERY  07/2016   kyphoplasty   CERVICAL SPINE SURGERY  1992   posterior diskectomy   COLONOSCOPY     CYSTOSCOPY WITH RETROGRADE PYELOGRAM, URETEROSCOPY AND STENT PLACEMENT Left 03/09/2013   Procedure: CYSTOSCOPY WITH RETROGRADE PYELOGRAM, URETEROSCOPY AND LEFT STENT PLACEMENT;  Surgeon: Sebastian Ache, MD;  Location: [facility];  Service: Urology;  Laterality: Left;   ENDOMETRIAL ABLATION     EYE SURGERY Bilateral    cataract extractions   IR KYPHO THORACIC WITH BONE BIOPSY  09/03/2021   IR RADIOLOGIST EVAL & MGMT  08/26/2021   IR RADIOLOGIST EVAL & MGMT  09/18/2021   KYPHOPLASTY N/A 07/23/2016   Procedure: KYPHOPLASTY T11;  Surgeon: Kennedy Bucker, MD;  Location: [facility];  Service: Orthopedics;  Laterality: N/A;   KYPHOPLASTY N/A 03/21/2019   Procedure: L3 KYPHOPLASTY;  Surgeon: Kennedy Bucker, MD;  Location: [facility];  Service: Orthopedics;  Laterality: N/A;   LAPAROSCOPIC CHOLECYSTECTOMY   1998   PUBOVAGINAL SLING  1995   TUBAL LIGATION     UPPER GI ENDOSCOPY     Patient Active Problem List   Diagnosis Date Noted   Vertigo 08/14/2022   Urinary incontinence 08/14/2022   DNR (do not resuscitate) 08/14/2022   Near syncope 04/22/2022   COVID-19 04/21/2022   COVID-19 virus infection 04/20/2022   COVID 04/20/2022   Stroke (HCC) 02/27/2021   Leukocytosis 02/27/2021   Hyperlipidemia 02/27/2021   History of compression fracture of spine 12/10/2016   Mild renal insufficiency 12/10/2016   Postmenopausal 12/10/2016   Pure hypercholesterolemia 12/10/2016   Compression fracture of thoracic vertebra (HCC) 07/16/2016   Low vitamin B12 level 11/28/2014   B12 deficiency 11/28/2013   Hip pain, chronic 03/06/2012   Obesity (BMI 30-39.9) 03/06/2012   Postmenopausal atrophic vaginitis 01/26/2012   Screening for cervical cancer 01/26/2012   Screening for colon cancer 01/26/2012   Malaise 01/26/2012   Glaucoma (increased eye pressure)    Osteoarthritis    DYSLIPIDEMIA 04/11/2007   GERD 04/11/2007   DEPRESSION, HX OF 04/11/2007    PCP: Lauro Regulus, MD  REFERRING PROVIDER:Ghimire, Werner Lean, MD    REFERRING DIAG: R42 (ICD-10-CM) - Vertigo; Assess for BPPV, vestibular PT   THERAPY DIAG:  Dizziness and giddiness  Unsteadiness on feet  ONSET DATE: 08/10/2022  Rationale for Evaluation and Treatment: Rehabilitation  SUBJECTIVE:   SUBJECTIVE STATEMENT: Pt presents in transport chair. Pt reports dizziness symptoms started around Jan 8th this year. Her first symptom was a headache and then the pt experienced a spinning sensation. She reports the spinning lasted for about a minute. The spinning was triggered by rolling over to her side in her bed, it can also be triggered by leaning her head back as well. She reports she's had spinning before in July 2022 when she had a TIA.  She continued to feel unsteady after onset of most recent episode and briefly passed out when out  for her grandson's birthday. Pt thinks she passed out because she was "so anxious." Pt went to ED a couple days later where she was told she had vertigo. She was prescribed Meclizine. She reports she has not taken Meclizine today. Pt reports while in the hospital they provided a positional maneuver for her R ear. She has improved since this maneuver but says the dizziness has not fully resolved. She now uses her walker and cane in her house for safety. She is worried about falling. Pt also states when she doesn't have to go anywhere she is less likely to have headache, when she has to go out and do things she reports she gets a headache. Reports her BP is typically good and not high. Pt accompanied by: self  PERTINENT HISTORY:  Pt with hx 08/14/2022 of presenting to ED with acute onset of dizziness/vertigo. Per referral suspected BPPV where pt was treated in hospital with Epley 2x. Pt hospital stay 08/14/2022-08/15/2022. Per chart hx mini stroke with LLE deficits (pt reports TIA July 2022). While symptoms have improved, pt has continued to experience less intense, brief vertigo that is triggered by positional changes.   PMH significant for arthritis, HLD, TIA 2022, CVA, urinary incontinence, dyslipidemia, depression, glaucoma, chronic hip pain, B12 deficiency, compression fx of thoracic vertebra, mild renal insufficiency, Covid-19, back surgery kyphoplasty 2017, IR Kypho thoracic with bone biopsy 09/03/2021,   PAIN:  Are you having pain? Yes: NPRS scale: 6-7/10 Pain location: reports some pain around her temples Pain description: dull Aggravating factors:   Relieving factors:    PRECAUTIONS: Fall  WEIGHT BEARING RESTRICTIONS: No  FALLS: Has patient fallen in last 6 months? Yes. Number of falls 0  LIVING ENVIRONMENT: from chart  Lives with: Lives in: House/apartment Stairs:  level entry, one level home Has following equipment at home: Single point cane and Walker - 4 wheeled  PLOF:  Independent  PATIENT GOALS: Pt would like dizziness to go away  OBJECTIVE:   DIAGNOSTIC FINDINGS:    [place] 08/14/2022: "IMPRESSION: 1. No acute intracranial abnormality. 2. Remote right PCA territory infarct. 3. Mild chronic microvascular ischemic changes of the white matter, stable."  CT HEAD WO CONTRAST (5MM) 04/20/2022 "IMPRESSION: No acute intracranial abnormality.   Old right occipital lobe infarct."  US Carotid Bilateral 02/28/2021 "IMPRESSION: 1. Mild atherosclerotic disease involving the bilateral carotid arteries. Estimated degree of stenosis in the internal carotid arteries is less than 50% bilaterally. 2. Patent vertebral arteries with antegrade flow."      COGNITION: Overall cognitive status: Within functional limits for tasks assessed   SENSATION: deferred   POSTURE:  rounded shoulders and increased thoracic kyphosis  Cervical ROM:  stiffness/somewhat pain limited observed and reported with rotation, formal assessment to be completed future date   STRENGTH: deferred  LOWER EXTREMITY MMT:     BED MOBILITY:  Impaired per report due to triggering of dizziness with rolling, supine<>sit  TRANSFERS: Assistive device utilized: Environmental consultant - 4 wheeled  Sit to stand: Modified independence Stand to sit: Modified independence    GAIT: Gait pattern: decreased gait speed per observation and due to reported FOF, slight crouched posture Distance walked: clinic distances Assistive device utilized: Environmental consultant - 4 wheeled Level of assistance: close CGA provided Comments: pt reports poor balance confidence with gait due to dizziness  FUNCTIONAL TESTS:  DGI and M-CTSIB to be performed future session  PATIENT SURVEYS:  FOTO 39  VESTIBULAR ASSESSMENT:  GENERAL OBSERVATION: pt with slow cautious movements, particularly with testing due to FOF and dizziness with positional testing    SYMPTOM BEHAVIOR:  Subjective history: Onset around 08/10/2022, pt  has experienced general improvement since seen in hospital on 08/14/2022 where she reports they treated her R ear with Epley maneuver. Dizziness/vertigo, while much improved, has not fully resolved.   Non-Vestibular symptoms: neck pain, headaches, and loss of consciousness  Type of dizziness: Imbalance (Disequilibrium), Spinning/Vertigo, and Unsteady with head/body turns  Frequency: positionally triggered   Duration: no greater than a minute  Aggravating factors: Induced by position change: rolling, supine<>sit, tilting head back/fwd  Relieving factors: still  Progression of symptoms: better  OCULOMOTOR EXAM:  Ocular Alignment: normal  Ocular ROM: No Limitations  Spontaneous Nystagmus: absent  Gaze-Induced Nystagmus: absent  Smooth Pursuits: saccades  Saccades: intact  Convergence/Divergence: WNL    VESTIBULAR - OCULAR REFLEX:   Slow VOR: Normal  VOR Cancellation:   Head-Impulse Test:   Dynamic Visual Acuity:    POSITIONAL TESTING:  Roll testing: negative bilat R Dix-Hallpike: moderate dizziness, pt with delayed-onset of spinning sensation that lasted <20 sec, no nystagmus observed (side pt reports previously treated in hospital) L Dix-Hallpike: negative    OTHOSTATICS: not done  FUNCTIONAL GAIT: deferred  VESTIBULAR TREATMENT:                                                                                                   DATE:   R Epley 1x: dizziness throughout maneuver lasting <2 seconds, but no vertigo by end of treatment. Reports feeling OK. Will retest next appt with vestibular goggles if pt still experiencing spinning. Reviewed post-maneuver precautions, to take it easy today and use RW for mobility/safety. Reviewed red flag signs/symptoms to watch out for and to seek emergency care should she experience these.   PATIENT EDUCATION: Education details: exam findings, goals (initial, more to be completed next visit), plan Person educated: Patient Education method:  Explanation, Demonstration, and Verbal cues Education comprehension: verbalized understanding and returned demonstration  HOME EXERCISE PROGRAM:   GOALS: Goals reviewed with patient? Yes (initial review, more goals to be tested/reviewed next few appts)   SHORT TERM GOALS: Target date: 10/01/2022   Patient will be independent in home exercise program to improve strength/mobility for better functional independence with ADLs. Baseline: to be issued next 1-2 visits Goal status: INITIAL   LONG TERM GOALS: Target date: 11/12/2022    Patient will increase  FOTO score to equal to or greater than  55   to demonstrate statistically significant improvement in mobility and quality of life.  Baseline: 39 Goal status: INITIAL  2.  Pt to report no dizziness or spinning sensation with rolling, tilting her head back or forward to indicate improvement in positionally-triggered dizziness symptoms and decreased fall risk. Baseline: rolling, tilting head back and FWD currently trigger spinning sensation  Goal status: INITIAL  3.  Patient will increase ABC scale score >80% to demonstrate better functional mobility and better confidence with ADLs.  Baseline: to be completed next 1-2 visits as indicated Goal status: INITIAL  4.  Patient will increase dynamic gait index score to >19/24 as to demonstrate reduced fall risk and improved dynamic gait balance for better safety with community/home ambulation.  Baseline: to be completed next 1-2 visits as indicated Goal status: INITIAL   ASSESSMENT:  CLINICAL IMPRESSION: Patient is a pleasant 82 y.o. female who was seen today for physical therapy evaluation and treatment for dizziness where pt recently seen in ED (08/14/2022) and treated with Epley maneuver for R side per pt. Pt with negative roll testing B, and negative Dix-Hallpike on L side. With R Dix-Hallpike pt did experience delayed-onset spinning sensation that lasted <20 sec, but no nystagmus was  observed. As this was the side pt reported was treated with an Epley (with improvement in symptoms) in the hospital, PT provided R Epley 1x. If pt still experiencing vertigo next visit will reassess with vestibular goggles. Pt was also found to have saccadic smooth pursuits, indicating possible central component that could be consistent with TIA history. The pt will benefit from further skilled PT to improve positionally-triggered dizziness symptoms and to improve balance in order to decrease fall risk and increase QOL.  OBJECTIVE IMPAIRMENTS: Abnormal gait, decreased balance, decreased mobility, difficulty walking, dizziness, improper body mechanics, postural dysfunction, obesity, and pain.   ACTIVITY LIMITATIONS: bending, standing, squatting, stairs, transfers, bed mobility, and locomotion level  PARTICIPATION LIMITATIONS: meal prep, cleaning, laundry, shopping, community activity, and yard work  PERSONAL FACTORS: Age, Fitness, Past/current experiences, Sex, Time since onset of injury/illness/exacerbation, and 3+ comorbidities: PMH significant for arthritis, HLD, TIA 2022, CVA, urinary incontinence, dyslipidemia, depression, glaucoma, chronic hip pain, B12 deficiency, compression fx of thoracic vertebra, mild renal insufficiency, Covid-19, back surgery kyphoplasty 2017, IR Kypho thoracic with bone biopsy 09/03/2021,   are also affecting patient's functional outcome.   REHAB POTENTIAL: Good  CLINICAL DECISION MAKING: Evolving/moderate complexity  EVALUATION COMPLEXITY: Moderate   PLAN:  PT FREQUENCY: 2x/week  PT DURATION: 12 weeks  PLANNED INTERVENTIONS: Therapeutic exercises, Therapeutic activity, Neuromuscular re-education, Balance training, Gait training, Patient/Family education, Self Care, Joint mobilization, Stair training, Vestibular training, Canalith repositioning, Visual/preceptual remediation/compensation, Orthotic/Fit training, DME instructions, Wheelchair mobility training, Spinal  mobilization, Cryotherapy, Moist heat, Splintting, Taping, Manual therapy, and Re-evaluation  PLAN FOR NEXT SESSION: positional testing/reassessment as indicated, DGI, M-CTSIB   Zollie Pee, PT 08/20/2022, 5:17 PM

## 2022-08-20 ENCOUNTER — Ambulatory Visit: Payer: 59 | Attending: Internal Medicine

## 2022-08-20 DIAGNOSIS — R42 Dizziness and giddiness: Secondary | ICD-10-CM | POA: Diagnosis present

## 2022-08-20 DIAGNOSIS — M6281 Muscle weakness (generalized): Secondary | ICD-10-CM | POA: Diagnosis present

## 2022-08-20 DIAGNOSIS — R2681 Unsteadiness on feet: Secondary | ICD-10-CM | POA: Diagnosis present

## 2022-08-20 DIAGNOSIS — R262 Difficulty in walking, not elsewhere classified: Secondary | ICD-10-CM | POA: Insufficient documentation

## 2022-08-25 ENCOUNTER — Ambulatory Visit: Payer: 59

## 2022-08-25 DIAGNOSIS — R42 Dizziness and giddiness: Secondary | ICD-10-CM | POA: Diagnosis not present

## 2022-08-25 DIAGNOSIS — R262 Difficulty in walking, not elsewhere classified: Secondary | ICD-10-CM

## 2022-08-25 DIAGNOSIS — M6281 Muscle weakness (generalized): Secondary | ICD-10-CM

## 2022-08-25 DIAGNOSIS — R2681 Unsteadiness on feet: Secondary | ICD-10-CM

## 2022-08-25 NOTE — Therapy (Signed)
OUTPATIENT PHYSICAL THERAPY VESTIBULAR TREATMENT     Patient Name: Tiffany Mcbride MRN: [id number] DOB:08/24/1940, 82 y.o., female Today's Date: 08/25/2022  END OF SESSION:  PT End of Session - 08/25/22 1623     Visit Number 2    Number of Visits 25    Date for PT Re-Evaluation 11/12/22    PT Start Time 1150    PT Stop Time 1230    PT Time Calculation (min) 40 min    Equipment Utilized During Treatment Gait belt    Activity Tolerance Patient tolerated treatment well    Behavior During Therapy WFL for tasks assessed/performed              Past Medical History:  Diagnosis Date   Arthritis    At high risk for falls    GERD (gastroesophageal reflux disease)    Glaucoma of both eyes    Heart murmur    wen she was in her 59's   History of gastric ulcer    History of kidney stones    Hyperlipidemia    Left ureteral calculus    Stroke [facility])    TIA (transient ischemic attack) 01/2021   Urgency of urination    Past Surgical History:  Procedure Laterality Date   BACK SURGERY  07/2016   kyphoplasty   CERVICAL SPINE SURGERY  1992   posterior diskectomy   COLONOSCOPY     CYSTOSCOPY WITH RETROGRADE PYELOGRAM, URETEROSCOPY AND STENT PLACEMENT Left 03/09/2013   Procedure: CYSTOSCOPY WITH RETROGRADE PYELOGRAM, URETEROSCOPY AND LEFT STENT PLACEMENT;  Surgeon: Alexis Frock, MD;  Location: [facility];  Service: Urology;  Laterality: Left;   ENDOMETRIAL ABLATION     EYE SURGERY Bilateral    cataract extractions   IR KYPHO THORACIC WITH BONE BIOPSY  09/03/2021   IR RADIOLOGIST EVAL & MGMT  08/26/2021   IR RADIOLOGIST EVAL & MGMT  09/18/2021   KYPHOPLASTY N/A 07/23/2016   Procedure: KYPHOPLASTY T11;  Surgeon: Hessie Knows, MD;  Location: [facility];  Service: Orthopedics;  Laterality: N/A;   KYPHOPLASTY N/A 03/21/2019   Procedure: L3 KYPHOPLASTY;  Surgeon: Hessie Knows, MD;  Location: [facility];  Service: Orthopedics;  Laterality: N/A;   [place]   TUBAL LIGATION     UPPER GI ENDOSCOPY     Patient Active Problem List   Diagnosis Date Noted   Vertigo 08/14/2022   Urinary incontinence 08/14/2022   DNR (do not resuscitate) 08/14/2022   Near syncope 04/22/2022   COVID-19 04/21/2022   COVID-19 virus infection 04/20/2022   COVID 04/20/2022   Stroke ([place]) 02/27/2021   Leukocytosis 02/27/2021   Hyperlipidemia 02/27/2021   History of compression fracture of spine 12/10/2016   Mild renal insufficiency 12/10/2016   Postmenopausal 12/10/2016   Pure hypercholesterolemia 12/10/2016   Compression fracture of thoracic vertebra ([place]) 07/16/2016   Low vitamin B12 level 11/28/2014   B12 deficiency 11/28/2013   Hip pain, chronic 03/06/2012   Obesity (BMI 30-39.9) 03/06/2012   Postmenopausal atrophic vaginitis 01/26/2012   Screening for cervical cancer 01/26/2012   Screening for colon cancer 01/26/2012   Malaise 01/26/2012   Glaucoma (increased eye pressure)    Osteoarthritis    DYSLIPIDEMIA 04/11/2007   GERD 04/11/2007   DEPRESSION, HX OF 04/11/2007    PCP: Kirk Ruths, MD  REFERRING PROVIDER:Ghimire, Henreitta Leber, MD    REFERRING DIAG: R42 (ICD-10-CM) - Vertigo; Assess for BPPV, vestibular PT   THERAPY DIAG:  Unsteadiness on feet  Difficulty in walking, not elsewhere classified  Muscle weakness (generalized)  ONSET DATE: 08/10/2022  Rationale for Evaluation and Treatment: Rehabilitation  SUBJECTIVE:   SUBJECTIVE STATEMENT: Pt reports no dizzy spells since maneuvers provided last appointment. Pt denies wooziness/lightheadedness/swimmy-headedness. She reports she is able to tilt her head back now to put her eye drops in without issue, and was able to bend and do work in her kitchen without problems. Pt reports no stumbles or falls. Pt reports she is feeling confident in her balance, she reports she was walking around her kitchen without her cane and she did not stumble. She forgot she  wasn't using it.  She'd like to possibly make today her last day. "I'm ok and if I need you I can call you." She hasn't had to take any of her Meclizine or nausea medications.  Pt accompanied by: self  PERTINENT HISTORY:  Pt with hx 08/14/2022 of presenting to ED with acute onset of dizziness/vertigo. Per referral suspected BPPV where pt was treated in hospital with Epley 2x. Pt hospital stay 08/14/2022-08/15/2022. Per chart hx mini stroke with LLE deficits (pt reports TIA July 2022). While symptoms have improved, pt has continued to experience less intense, brief vertigo that is triggered by positional changes.   PMH significant for arthritis, HLD, TIA 2022, CVA, urinary incontinence, dyslipidemia, depression, glaucoma, chronic hip pain, B12 deficiency, compression fx of thoracic vertebra, mild renal insufficiency, Covid-19, back surgery kyphoplasty 2017, IR Kypho thoracic with bone biopsy 09/03/2021,   PAIN:  Are you having pain? Yes: NPRS scale: 6-7/10 Pain location: reports some pain around her temples Pain description: dull Aggravating factors:   Relieving factors:    PRECAUTIONS: Fall  WEIGHT BEARING RESTRICTIONS: No  FALLS: Has patient fallen in last 6 months? Yes. Number of falls 0  LIVING ENVIRONMENT: from chart  Lives with: Lives in: House/apartment Stairs:  level entry, one level home Has following equipment at home: Single point cane and Walker - 4 wheeled  PLOF: Independent  PATIENT GOALS: Pt would like dizziness to go away  OBJECTIVE:   DIAGNOSTIC FINDINGS:    MR BRAIN WO CONTRAST 08/14/2022: "IMPRESSION: 1. No acute intracranial abnormality. 2. Remote right PCA territory infarct. 3. Mild chronic microvascular ischemic changes of the white matter, stable."  CT HEAD WO CONTRAST ( ) 04/20/2022 "IMPRESSION: No acute intracranial abnormality.   Old right occipital lobe infarct."  US Carotid Bilateral 02/28/2021 "IMPRESSION: 1. Mild atherosclerotic disease involving  the bilateral carotid arteries. Estimated degree of stenosis in the internal carotid arteries is less than 50% bilaterally. 2. Patent vertebral arteries with antegrade flow."      COGNITION: Overall cognitive status: Within functional limits for tasks assessed   SENSATION: deferred   POSTURE:  rounded shoulders and increased thoracic kyphosis  Cervical ROM:  stiffness/somewhat pain limited observed and reported with rotation, formal assessment to be completed future date   STRENGTH: 08/25/22: MMT: grossly 4-/5 B LE  LOWER EXTREMITY MMT:     BED MOBILITY:  Impaired per report due to triggering of dizziness with rolling, supine<>sit  TRANSFERS: Assistive device utilized: Environmental consultant - 4 wheeled  Sit to stand: Modified independence Stand to sit: Modified independence    GAIT: Gait pattern: decreased gait speed per observation and due to reported FOF, slight crouched posture Distance walked: clinic distances Assistive device utilized: Environmental consultant - 4 wheeled Level of assistance: close CGA provided Comments: pt reports poor balance confidence with gait due to dizziness  FUNCTIONAL TESTS:  DGI: 18/24  PATIENT SURVEYS:  FOTO 39  VESTIBULAR ASSESSMENT:  GENERAL OBSERVATION: pt with slow cautious movements, particularly with testing due to FOF and dizziness with positional testing    SYMPTOM BEHAVIOR:  Subjective history: Onset around 08/10/2022, pt has experienced general improvement since seen in hospital on 08/14/2022 where she reports they treated her R ear with Epley maneuver. Dizziness/vertigo, while much improved, has not fully resolved.   Non-Vestibular symptoms: neck pain, headaches, and loss of consciousness  Type of dizziness: Imbalance (Disequilibrium), Spinning/Vertigo, and Unsteady with head/body turns  Frequency: positionally triggered   Duration: no greater than a minute  Aggravating factors: Induced by position change: rolling, supine<>sit, tilting head  back/fwd  Relieving factors: still  Progression of symptoms: better  OCULOMOTOR EXAM:  Ocular Alignment: normal  Ocular ROM: No Limitations  Spontaneous Nystagmus: absent  Gaze-Induced Nystagmus: absent  Smooth Pursuits: saccades  Saccades: intact  Convergence/Divergence: WNL    VESTIBULAR - OCULAR REFLEX:   Slow VOR: Normal  VOR Cancellation:   Head-Impulse Test:   Dynamic Visual Acuity:    POSITIONAL TESTING:  Roll testing: negative bilat R Dix-Hallpike: moderate dizziness, pt with delayed-onset of spinning sensation that lasted <20 sec, no nystagmus observed (side pt reports previously treated in hospital) L Dix-Hallpike: negative    OTHOSTATICS: not done  FUNCTIONAL GAIT: deferred  VESTIBULAR TREATMENT:                                                                                                   DATE:   NMR: FOTO: 70 DGI: 18/24   TherEx: MMT: strength grossly 4-/5 B LE Seated: STS 10x March (seated) 20x alt LE LAQ 20x each LE Issued and reviewed HEP  PATIENT EDUCATION: Education details: recommendations, plan, HEP Person educated: Patient Education method: Programmer, multimedia, Demonstration, Verbal cues, and Handouts Education comprehension: verbalized understanding and returned demonstration  HOME EXERCISE PROGRAM: Access Code: [email or web address] URL: [email or web address] Date: 08/25/2022 Prepared by: Temple Pacini  Exercises - Sit to Stand with Counter Support  - 1 x daily - 5 x weekly - 3 sets - 10 reps - Seated March  - 1 x daily - 5 x weekly - 3 sets - 20 reps - Seated Long Arc Quad  - 1 x daily - 5 x weekly - 2 sets - 20 reps   GOALS: Goals reviewed with patient? Yes (initial review, more goals to be tested/reviewed next few appts)   SHORT TERM GOALS: Target date: 10/06/2022   Patient will be independent in home exercise program to improve strength/mobility for better functional independence with ADLs. Baseline: to be issued next 1-2  visits Goal status: INITIAL   LONG TERM GOALS: Target date: 11/17/2022    Patient will increase FOTO score to equal to or greater than  55   to demonstrate statistically significant improvement in mobility and quality of life.  Baseline: 39; 08/25/22: 70 Goal status: MET  2.  Pt to report no dizziness or spinning sensation with rolling, tilting her head back or forward to indicate improvement in positionally-triggered dizziness symptoms and decreased fall risk. Baseline: rolling, tilting head back and  FWD currently trigger spinning sensation  Goal status: INITIAL  3.  Patient will increase ABC scale score >80% to demonstrate better functional mobility and better confidence with ADLs.  Baseline: to be completed next 1-2 visits as indicated Goal status: INITIAL  4.  Patient will increase dynamic gait index score to >19/24 as to demonstrate reduced fall risk and improved dynamic gait balance for better safety with community/home ambulation.  Baseline: to be completed next 1-2 visits as indicated;08/25/22: 18/24 Goal status: INITIAL   ASSESSMENT:  CLINICAL IMPRESSION: Pt presents to PT reporting her dizziness has resolved since maneuver provided last session. PT did complete further assessment of pt strength and balance and found BLE strength impairment and balance impairment AEB MMT and DGI performances. PT did recommend that pt would benefit from addressing these impairments in therapy to decrease her fall risk. PT verbalized understanding but wants to try one month period away from PT working on LE strengthening HEP independently since her main concern of dizziness has resolved. Pt was able to demonstrate correct technique and understanding of HEP in session. Pt plans now to return in 1 month. The pt will benefit from further skilled PT to improve balance and strength in order to decrease fall risk and increase QOL.  OBJECTIVE IMPAIRMENTS: Abnormal gait, decreased balance, decreased mobility,  difficulty walking, dizziness, improper body mechanics, postural dysfunction, obesity, and pain.   ACTIVITY LIMITATIONS: bending, standing, squatting, stairs, transfers, bed mobility, and locomotion level  PARTICIPATION LIMITATIONS: meal prep, cleaning, laundry, shopping, community activity, and yard work  PERSONAL FACTORS: Age, Fitness, Past/current experiences, Sex, Time since onset of injury/illness/exacerbation, and 3+ comorbidities: PMH significant for arthritis, HLD, TIA 2022, CVA, urinary incontinence, dyslipidemia, depression, glaucoma, chronic hip pain, B12 deficiency, compression fx of thoracic vertebra, mild renal insufficiency, Covid-19, back surgery kyphoplasty 2017, IR Kypho thoracic with bone biopsy 09/03/2021,   are also affecting patient's functional outcome.   REHAB POTENTIAL: Good  CLINICAL DECISION MAKING: Evolving/moderate complexity  EVALUATION COMPLEXITY: Moderate   PLAN:  PT FREQUENCY: 2x/week  PT DURATION: 12 weeks  PLANNED INTERVENTIONS: Therapeutic exercises, Therapeutic activity, Neuromuscular re-education, Balance training, Gait training, Patient/Family education, Self Care, Joint mobilization, Stair training, Vestibular training, Canalith repositioning, Visual/preceptual remediation/compensation, Orthotic/Fit training, DME instructions, Wheelchair mobility training, Spinal mobilization, Cryotherapy, Moist heat, Splintting, Taping, Manual therapy, and Re-evaluation  PLAN FOR NEXT SESSION: positional testing/reassessment as indicated, DGI, M-CTSIB   Zollie Pee, PT 08/25/2022, 4:35 PM

## 2022-09-03 ENCOUNTER — Ambulatory Visit: Payer: 59

## 2022-09-15 ENCOUNTER — Ambulatory Visit: Payer: 59

## 2022-09-17 ENCOUNTER — Ambulatory Visit: Payer: 59

## 2022-10-01 ENCOUNTER — Telehealth: Payer: Self-pay

## 2022-10-01 ENCOUNTER — Ambulatory Visit: Payer: 59

## 2022-10-01 NOTE — Telephone Encounter (Signed)
PT contacted pt via secure phone line and left VM due to no-show/missed visit. PT provided contact information for clinic as pt does not currently have any remaining appointments scheduled at this time.   Ricard Dillon PT, DPT

## 2022-10-06 ENCOUNTER — Ambulatory Visit: Payer: 59 | Admitting: Podiatry

## 2022-11-03 ENCOUNTER — Ambulatory Visit (INDEPENDENT_AMBULATORY_CARE_PROVIDER_SITE_OTHER): Payer: 59 | Admitting: Podiatry

## 2022-11-03 ENCOUNTER — Encounter: Payer: Self-pay | Admitting: Podiatry

## 2022-11-03 VITALS — BP 144/73 | HR 63

## 2022-11-03 DIAGNOSIS — M79674 Pain in right toe(s): Secondary | ICD-10-CM | POA: Diagnosis not present

## 2022-11-03 DIAGNOSIS — M79675 Pain in left toe(s): Secondary | ICD-10-CM | POA: Diagnosis not present

## 2022-11-03 DIAGNOSIS — B351 Tinea unguium: Secondary | ICD-10-CM | POA: Diagnosis not present

## 2022-11-03 NOTE — Progress Notes (Signed)
   Chief Complaint  Patient presents with   Nail Problem    "Do my toenails."    SUBJECTIVE Patient presents to office today complaining of elongated, thickened nails that cause pain while ambulating in shoes.  Patient is unable to trim their own nails. Patient is here for further evaluation and treatment.  Past Medical History:  Diagnosis Date   Arthritis    At high risk for falls    GERD (gastroesophageal reflux disease)    Glaucoma of both eyes    Heart murmur    wen she was in her 89's   History of gastric ulcer    History of kidney stones    Hyperlipidemia    Left ureteral calculus    Stroke    TIA (transient ischemic attack) 01/2021   Urgency of urination     OBJECTIVE General Patient is awake, alert, and oriented x 3 and in no acute distress. Derm Skin is dry and supple bilateral. Negative open lesions or macerations. Remaining integument unremarkable. Nails are tender, long, thickened and dystrophic with subungual debris, consistent with onychomycosis, 1-5 bilateral. No signs of infection noted. Vasc  DP and PT pedal pulses palpable bilaterally. Temperature gradient within normal limits.  Neuro Epicritic and protective threshold sensation grossly intact bilaterally.  Musculoskeletal Exam No symptomatic pedal deformities noted bilateral. Muscular strength within normal limits.  ASSESSMENT 1.  Pain due to onychomycosis of toenails both  PLAN OF CARE 1. Patient evaluated today.  2. Instructed to maintain good pedal hygiene and foot care.  3. Mechanical debridement of nails 1-5 bilaterally performed using a nail nipper. Filed with dremel without incident.  4. Return to clinic in 3 mos.    Edrick Kins, DPM [facility]  Dr. Edrick Kins, DPM    [address], [place] [address]                Office [phone]  Fax [phone]

## 2023-02-02 ENCOUNTER — Ambulatory Visit (INDEPENDENT_AMBULATORY_CARE_PROVIDER_SITE_OTHER): Payer: 59 | Admitting: Podiatry

## 2023-02-02 DIAGNOSIS — M79675 Pain in left toe(s): Secondary | ICD-10-CM | POA: Diagnosis not present

## 2023-02-02 DIAGNOSIS — M79674 Pain in right toe(s): Secondary | ICD-10-CM

## 2023-02-02 DIAGNOSIS — B351 Tinea unguium: Secondary | ICD-10-CM

## 2023-02-02 NOTE — Progress Notes (Signed)
   Chief Complaint  Patient presents with   Nail Problem    Patient came in today for Routine foot care, nail trim    SUBJECTIVE Patient presents to office today complaining of elongated, thickened nails that cause pain while ambulating in shoes.  Patient is unable to trim their own nails. Patient is here for further evaluation and treatment.  Past Medical History:  Diagnosis Date   Arthritis    At high risk for falls    GERD (gastroesophageal reflux disease)    Glaucoma of both eyes    Heart murmur    wen she was in her 30's   History of gastric ulcer    History of kidney stones    Hyperlipidemia    Left ureteral calculus    Stroke [facility])    TIA (transient ischemic attack) 01/2021   Urgency of urination     OBJECTIVE General Patient is awake, alert, and oriented x 3 and in no acute distress. Derm Skin is dry and supple bilateral. Negative open lesions or macerations. Remaining integument unremarkable. Nails are tender, long, thickened and dystrophic with subungual debris, consistent with onychomycosis, 1-5 bilateral. No signs of infection noted. Vasc  DP and PT pedal pulses palpable bilaterally. Temperature gradient within normal limits.  Neuro Epicritic and protective threshold sensation grossly intact bilaterally.  Musculoskeletal Exam No symptomatic pedal deformities noted bilateral. Muscular strength within normal limits.  ASSESSMENT 1.  Pain due to onychomycosis of toenails both  PLAN OF CARE 1. Patient evaluated today.  2. Instructed to maintain good pedal hygiene and foot care.  3. Mechanical debridement of nails 1-5 bilaterally performed using a nail nipper. Filed with dremel without incident.  4. Return to clinic in 3 mos.    Felecia Shelling, DPM [facility]  Dr. Felecia Shelling, DPM    [address] [place], [place] [address]                Office [phone]  Fax [phone]

## 2023-05-07 ENCOUNTER — Encounter: Payer: Self-pay | Admitting: Podiatry

## 2023-05-07 ENCOUNTER — Ambulatory Visit (INDEPENDENT_AMBULATORY_CARE_PROVIDER_SITE_OTHER): Payer: 59 | Admitting: Podiatry

## 2023-05-07 DIAGNOSIS — B351 Tinea unguium: Secondary | ICD-10-CM

## 2023-05-07 DIAGNOSIS — M79674 Pain in right toe(s): Secondary | ICD-10-CM | POA: Diagnosis not present

## 2023-05-07 DIAGNOSIS — M79675 Pain in left toe(s): Secondary | ICD-10-CM

## 2023-05-07 NOTE — Progress Notes (Signed)
   Chief Complaint  Patient presents with   Nail Problem    Patient is here for [facility]    SUBJECTIVE Patient presents to office today complaining of elongated, thickened nails that cause pain while ambulating in shoes.  Patient is unable to trim their own nails. Patient is here for further evaluation and treatment.  Past Medical History:  Diagnosis Date   Arthritis    At high risk for falls    GERD (gastroesophageal reflux disease)    Glaucoma of both eyes    Heart murmur    wen she was in her 30's   History of gastric ulcer    History of kidney stones    Hyperlipidemia    Left ureteral calculus    Stroke [facility])    TIA (transient ischemic attack) 01/2021   Urgency of urination     OBJECTIVE General Patient is awake, alert, and oriented x 3 and in no acute distress. Derm Skin is dry and supple bilateral. Negative open lesions or macerations. Remaining integument unremarkable. Nails are tender, long, thickened and dystrophic with subungual debris, consistent with onychomycosis, 1-5 bilateral. No signs of infection noted. Vasc  DP and PT pedal pulses palpable bilaterally. Temperature gradient within normal limits.  Neuro Epicritic and protective threshold sensation grossly intact bilaterally.  Musculoskeletal Exam No symptomatic pedal deformities noted bilateral. Muscular strength within normal limits.  ASSESSMENT 1.  Pain due to onychomycosis of toenails both  PLAN OF CARE 1. Patient evaluated today.  2. Instructed to maintain good pedal hygiene and foot care.  3. Mechanical debridement of nails 1-5 bilaterally performed using a nail nipper. Filed with dremel without incident.  4. Return to clinic in 3 mos.    Felecia Shelling, DPM [facility]  Dr. Felecia Shelling, DPM    [address] [place], [place] [address]                Office [phone]  Fax [phone]

## 2023-08-31 ENCOUNTER — Ambulatory Visit (INDEPENDENT_AMBULATORY_CARE_PROVIDER_SITE_OTHER): Payer: 59 | Admitting: Podiatry

## 2023-08-31 ENCOUNTER — Encounter: Payer: Self-pay | Admitting: Podiatry

## 2023-08-31 DIAGNOSIS — M79675 Pain in left toe(s): Secondary | ICD-10-CM | POA: Diagnosis not present

## 2023-08-31 DIAGNOSIS — B351 Tinea unguium: Secondary | ICD-10-CM | POA: Diagnosis not present

## 2023-08-31 DIAGNOSIS — M79674 Pain in right toe(s): Secondary | ICD-10-CM | POA: Diagnosis not present

## 2023-08-31 NOTE — Progress Notes (Signed)
   Chief Complaint  Patient presents with   Nail Problem    "Trim my toenails."    SUBJECTIVE Patient presents to office today complaining of elongated, thickened nails that cause pain while ambulating in shoes.  Patient is unable to trim their own nails. Patient is here for further evaluation and treatment.  Past Medical History:  Diagnosis Date   Arthritis    At high risk for falls    GERD (gastroesophageal reflux disease)    Glaucoma of both eyes    Heart murmur    wen she was in her 30's   History of gastric ulcer    History of kidney stones    Hyperlipidemia    Left ureteral calculus    Stroke [facility])    TIA (transient ischemic attack) 01/2021   Urgency of urination     OBJECTIVE General Patient is awake, alert, and oriented x 3 and in no acute distress. Derm Skin is dry and supple bilateral. Negative open lesions or macerations. Remaining integument unremarkable. Nails are tender, long, thickened and dystrophic with subungual debris, consistent with onychomycosis, 1-5 bilateral. No signs of infection noted. Vasc  DP and PT pedal pulses palpable bilaterally. Temperature gradient within normal limits.  Neuro Epicritic and protective threshold sensation grossly intact bilaterally.  Musculoskeletal Exam No symptomatic pedal deformities noted bilateral. Muscular strength within normal limits.  ASSESSMENT 1.  Pain due to onychomycosis of toenails both  PLAN OF CARE 1. Patient evaluated today.  2. Instructed to maintain good pedal hygiene and foot care.  3. Mechanical debridement of nails 1-5 bilaterally performed using a nail nipper. Filed with dremel without incident.  4. Return to clinic in 3 mos.    Felecia Shelling, DPM [facility]  Dr. Felecia Shelling, DPM    [address] [place], [place] [address]                Office [phone]  Fax [phone]

## 2023-12-03 ENCOUNTER — Ambulatory Visit (INDEPENDENT_AMBULATORY_CARE_PROVIDER_SITE_OTHER): Payer: 59 | Admitting: Podiatry

## 2023-12-03 DIAGNOSIS — M79674 Pain in right toe(s): Secondary | ICD-10-CM

## 2023-12-03 DIAGNOSIS — B351 Tinea unguium: Secondary | ICD-10-CM

## 2023-12-03 DIAGNOSIS — M79675 Pain in left toe(s): Secondary | ICD-10-CM

## 2023-12-03 NOTE — Progress Notes (Signed)
   No chief complaint on file.   SUBJECTIVE Patient presents to office today complaining of elongated, thickened nails that cause pain while ambulating in shoes.  Patient is unable to trim their own nails. Patient is here for further evaluation and treatment.  Past Medical History:  Diagnosis Date   Arthritis    At high risk for falls    GERD (gastroesophageal reflux disease)    Glaucoma of both eyes    Heart murmur    wen she was in her 30's   History of gastric ulcer    History of kidney stones    Hyperlipidemia    Left ureteral calculus    Stroke [facility])    TIA (transient ischemic attack) 01/2021   Urgency of urination     OBJECTIVE General Patient is awake, alert, and oriented x 3 and in no acute distress. Derm Skin is dry and supple bilateral. Negative open lesions or macerations. Remaining integument unremarkable. Nails are tender, long, thickened and dystrophic with subungual debris, consistent with onychomycosis, 1-5 bilateral. No signs of infection noted. Vasc  DP and PT pedal pulses palpable bilaterally. Temperature gradient within normal limits.  Neuro Epicritic and protective threshold sensation grossly intact bilaterally.  Musculoskeletal Exam No symptomatic pedal deformities noted bilateral. Muscular strength within normal limits.  ASSESSMENT 1.  Pain due to onychomycosis of toenails both  PLAN OF CARE 1. Patient evaluated today.  2. Instructed to maintain good pedal hygiene and foot care.  3. Mechanical debridement of nails 1-5 bilaterally performed using a nail nipper. Filed with dremel without incident.  4. Return to clinic in 3 mos.    Dot Gazella, DPM [facility]  Dr. Dot Gazella, DPM    [address] [place], [place] [address]                Office [phone]  Fax [phone]

## 2024-03-07 ENCOUNTER — Ambulatory Visit: Admitting: Podiatry

## 2024-05-09 ENCOUNTER — Encounter: Payer: Self-pay | Admitting: Podiatry

## 2024-05-09 ENCOUNTER — Ambulatory Visit: Admitting: Podiatry

## 2024-05-09 VITALS — Ht 62.0 in | Wt 202.2 lb

## 2024-05-09 DIAGNOSIS — M79675 Pain in left toe(s): Secondary | ICD-10-CM | POA: Diagnosis not present

## 2024-05-09 DIAGNOSIS — M79674 Pain in right toe(s): Secondary | ICD-10-CM

## 2024-05-09 DIAGNOSIS — B351 Tinea unguium: Secondary | ICD-10-CM

## 2024-05-09 NOTE — Progress Notes (Signed)
   Chief Complaint  Patient presents with   Nail Problem    Pt is here for RFC.    SUBJECTIVE Patient presents to office today complaining of elongated, thickened nails that cause pain while ambulating in shoes.  Patient is unable to trim their own nails. Patient is here for further evaluation and treatment.  Past Medical History:  Diagnosis Date   Arthritis    At high risk for falls    GERD (gastroesophageal reflux disease)    Glaucoma of both eyes    Heart murmur    wen she was in her 30's   History of gastric ulcer    History of kidney stones    Hyperlipidemia    Left ureteral calculus    Stroke [facility])    TIA (transient ischemic attack) 01/2021   Urgency of urination     OBJECTIVE General Patient is awake, alert, and oriented x 3 and in no acute distress. Derm Skin is dry and supple bilateral. Negative open lesions or macerations. Remaining integument unremarkable. Nails are tender, long, thickened and dystrophic with subungual debris, consistent with onychomycosis, 1-5 bilateral. No signs of infection noted. Vasc  DP and PT pedal pulses palpable bilaterally. Temperature gradient within normal limits.  Neuro Epicritic and protective threshold sensation grossly intact bilaterally.  Musculoskeletal Exam No symptomatic pedal deformities noted bilateral. Muscular strength within normal limits.  ASSESSMENT 1.  Pain due to onychomycosis of toenails both  PLAN OF CARE 1. Patient evaluated today.  2. Instructed to maintain good pedal hygiene and foot care.  3. Mechanical debridement of nails 1-5 bilaterally performed using a nail nipper. Filed with dremel without incident.  4. Return to clinic in 3 mos.    Thresa EMERSON Sar, DPM [facility]  Dr. Thresa EMERSON Sar, DPM    [address] [place], [place] [address]                Office [phone]  Fax [phone]

## 2024-08-15 ENCOUNTER — Ambulatory Visit (INDEPENDENT_AMBULATORY_CARE_PROVIDER_SITE_OTHER): Admitting: Podiatry

## 2024-08-15 VITALS — Ht 62.0 in | Wt 202.2 lb

## 2024-08-15 DIAGNOSIS — M79675 Pain in left toe(s): Secondary | ICD-10-CM

## 2024-08-15 DIAGNOSIS — M79674 Pain in right toe(s): Secondary | ICD-10-CM

## 2024-08-15 DIAGNOSIS — B351 Tinea unguium: Secondary | ICD-10-CM

## 2024-08-15 NOTE — Progress Notes (Signed)
" ° °  Chief Complaint  Patient presents with   Nail Problem    RFC    SUBJECTIVE Patient presents to office today complaining of elongated, thickened nails that cause pain while ambulating in shoes.  Patient is unable to trim their own nails. Patient is here for further evaluation and treatment.  Past Medical History:  Diagnosis Date   Arthritis    At high risk for falls    GERD (gastroesophageal reflux disease)    Glaucoma of both eyes    Heart murmur    wen she was in her 30's   History of gastric ulcer    History of kidney stones    Hyperlipidemia    Left ureteral calculus    Stroke [facility])    TIA (transient ischemic attack) 01/2021   Urgency of urination     OBJECTIVE General Patient is awake, alert, and oriented x 3 and in no acute distress. Derm Skin is dry and supple bilateral. Negative open lesions or macerations. Remaining integument unremarkable. Nails are tender, long, thickened and dystrophic with subungual debris, consistent with onychomycosis, 1-5 bilateral. No signs of infection noted. Vasc  DP and PT pedal pulses palpable bilaterally. Temperature gradient within normal limits.  Neuro Epicritic and protective threshold sensation grossly intact bilaterally.  Musculoskeletal Exam No symptomatic pedal deformities noted bilateral. Muscular strength within normal limits.  ASSESSMENT 1.  Pain due to onychomycosis of toenails both  PLAN OF CARE 1. Patient evaluated today.  2. Instructed to maintain good pedal hygiene and foot care.  3. Mechanical debridement of nails 1-5 bilaterally performed using a nail nipper. Filed with dremel without incident.  4. Return to clinic in 3 mos.    Thresa EMERSON Sar, DPM [facility]  Dr. Thresa EMERSON Sar, DPM    [address] [place], [place] [address]                Office [phone]  Fax [phone]   "

## 2024-11-14 ENCOUNTER — Ambulatory Visit: Admitting: Podiatry
